# Patient Record
Sex: Male | Born: 1950 | Race: White | Hispanic: No | Marital: Married | Smoking: Current every day smoker
Health system: Southern US, Community
[De-identification: ages and names within clinical notes are randomized; demographics above are authoritative.]

## PROBLEM LIST (undated history)

## (undated) DIAGNOSIS — R011 Cardiac murmur, unspecified: Secondary | ICD-10-CM

## (undated) DIAGNOSIS — I5022 Chronic systolic (congestive) heart failure: Secondary | ICD-10-CM

## (undated) DIAGNOSIS — R112 Nausea with vomiting, unspecified: Secondary | ICD-10-CM

## (undated) DIAGNOSIS — E162 Hypoglycemia, unspecified: Secondary | ICD-10-CM

## (undated) DIAGNOSIS — G4733 Obstructive sleep apnea (adult) (pediatric): Secondary | ICD-10-CM

## (undated) DIAGNOSIS — D693 Immune thrombocytopenic purpura: Secondary | ICD-10-CM

## (undated) DIAGNOSIS — Z9989 Dependence on other enabling machines and devices: Secondary | ICD-10-CM

## (undated) DIAGNOSIS — M1712 Unilateral primary osteoarthritis, left knee: Secondary | ICD-10-CM

## (undated) DIAGNOSIS — Z9889 Other specified postprocedural states: Secondary | ICD-10-CM

## (undated) DIAGNOSIS — I639 Cerebral infarction, unspecified: Secondary | ICD-10-CM

## (undated) DIAGNOSIS — I77810 Thoracic aortic ectasia: Secondary | ICD-10-CM

## (undated) DIAGNOSIS — I219 Acute myocardial infarction, unspecified: Secondary | ICD-10-CM

## (undated) DIAGNOSIS — M1711 Unilateral primary osteoarthritis, right knee: Secondary | ICD-10-CM

## (undated) DIAGNOSIS — IMO0001 Reserved for inherently not codable concepts without codable children: Secondary | ICD-10-CM

## (undated) DIAGNOSIS — H547 Unspecified visual loss: Secondary | ICD-10-CM

## (undated) DIAGNOSIS — J310 Chronic rhinitis: Secondary | ICD-10-CM

## (undated) DIAGNOSIS — J189 Pneumonia, unspecified organism: Secondary | ICD-10-CM

## (undated) DIAGNOSIS — N400 Enlarged prostate without lower urinary tract symptoms: Secondary | ICD-10-CM

## (undated) DIAGNOSIS — E785 Hyperlipidemia, unspecified: Secondary | ICD-10-CM

## (undated) DIAGNOSIS — E119 Type 2 diabetes mellitus without complications: Secondary | ICD-10-CM

## (undated) DIAGNOSIS — Z8739 Personal history of other diseases of the musculoskeletal system and connective tissue: Secondary | ICD-10-CM

## (undated) DIAGNOSIS — J329 Chronic sinusitis, unspecified: Secondary | ICD-10-CM

## (undated) DIAGNOSIS — I10 Essential (primary) hypertension: Secondary | ICD-10-CM

## (undated) DIAGNOSIS — I2510 Atherosclerotic heart disease of native coronary artery without angina pectoris: Secondary | ICD-10-CM

## (undated) DIAGNOSIS — Z87442 Personal history of urinary calculi: Secondary | ICD-10-CM

## (undated) DIAGNOSIS — M1990 Unspecified osteoarthritis, unspecified site: Secondary | ICD-10-CM

## (undated) DIAGNOSIS — K635 Polyp of colon: Secondary | ICD-10-CM

## (undated) HISTORY — PX: KNEE CARTILAGE SURGERY: SHX688

## (undated) HISTORY — DX: Acute myocardial infarction, unspecified: I21.9

## (undated) HISTORY — DX: Morbid (severe) obesity due to excess calories: E66.01

## (undated) HISTORY — DX: Chronic rhinitis: J31.0

## (undated) HISTORY — DX: Obstructive sleep apnea (adult) (pediatric): G47.33

## (undated) HISTORY — DX: Polyp of colon: K63.5

## (undated) HISTORY — DX: Benign prostatic hyperplasia without lower urinary tract symptoms: N40.0

## (undated) HISTORY — DX: Cerebral infarction, unspecified: I63.9

## (undated) HISTORY — DX: Type 2 diabetes mellitus without complications: E11.9

## (undated) HISTORY — PX: SINUS IRRIGATION: SHX2411

## (undated) HISTORY — DX: Essential (primary) hypertension: I10

## (undated) HISTORY — PX: UMBILICAL HERNIA REPAIR: SHX196

## (undated) HISTORY — PX: JOINT REPLACEMENT: SHX530

## (undated) HISTORY — DX: Thoracic aortic ectasia: I77.810

## (undated) HISTORY — DX: Chronic sinusitis, unspecified: J32.9

## (undated) HISTORY — PX: CYST EXCISION: SHX5701

## (undated) HISTORY — PX: NASAL SINUS SURGERY: SHX719

## (undated) HISTORY — PX: MENISCUS REPAIR: SHX5179

## (undated) HISTORY — DX: Chronic systolic (congestive) heart failure: I50.22

## (undated) HISTORY — DX: Dependence on other enabling machines and devices: Z99.89

## (undated) HISTORY — PX: HERNIA REPAIR: SHX51

## (undated) HISTORY — DX: Atherosclerotic heart disease of native coronary artery without angina pectoris: I25.10

---

## 2003-11-27 HISTORY — PX: CARDIAC CATHETERIZATION: SHX172

## 2007-08-07 ENCOUNTER — Encounter: Admission: RE | Admit: 2007-08-07 | Discharge: 2007-08-07 | Payer: Self-pay | Admitting: Family Medicine

## 2010-01-23 ENCOUNTER — Encounter: Admission: RE | Admit: 2010-01-23 | Discharge: 2010-01-23 | Payer: Self-pay | Admitting: Family Medicine

## 2010-01-30 ENCOUNTER — Ambulatory Visit (HOSPITAL_COMMUNITY): Admission: RE | Admit: 2010-01-30 | Discharge: 2010-01-30 | Payer: Self-pay | Admitting: Family Medicine

## 2010-06-30 ENCOUNTER — Ambulatory Visit: Payer: Self-pay | Admitting: Internal Medicine

## 2010-06-30 DIAGNOSIS — G4733 Obstructive sleep apnea (adult) (pediatric): Secondary | ICD-10-CM

## 2010-07-02 DIAGNOSIS — I6789 Other cerebrovascular disease: Secondary | ICD-10-CM

## 2010-07-02 DIAGNOSIS — I10 Essential (primary) hypertension: Secondary | ICD-10-CM

## 2010-07-02 DIAGNOSIS — I252 Old myocardial infarction: Secondary | ICD-10-CM

## 2010-07-02 DIAGNOSIS — E119 Type 2 diabetes mellitus without complications: Secondary | ICD-10-CM | POA: Insufficient documentation

## 2010-07-25 ENCOUNTER — Encounter: Payer: Self-pay | Admitting: Internal Medicine

## 2010-07-25 ENCOUNTER — Ambulatory Visit (HOSPITAL_BASED_OUTPATIENT_CLINIC_OR_DEPARTMENT_OTHER): Admission: RE | Admit: 2010-07-25 | Discharge: 2010-07-25 | Payer: Self-pay | Admitting: Internal Medicine

## 2010-07-30 ENCOUNTER — Ambulatory Visit: Payer: Self-pay | Admitting: Internal Medicine

## 2010-08-08 ENCOUNTER — Ambulatory Visit: Payer: Self-pay | Admitting: Internal Medicine

## 2010-08-08 DIAGNOSIS — J328 Other chronic sinusitis: Secondary | ICD-10-CM | POA: Insufficient documentation

## 2010-08-15 ENCOUNTER — Telehealth (INDEPENDENT_AMBULATORY_CARE_PROVIDER_SITE_OTHER): Payer: Self-pay | Admitting: *Deleted

## 2010-09-01 ENCOUNTER — Encounter: Payer: Self-pay | Admitting: Internal Medicine

## 2010-09-13 ENCOUNTER — Ambulatory Visit: Payer: Self-pay | Admitting: Internal Medicine

## 2010-09-14 ENCOUNTER — Encounter: Payer: Self-pay | Admitting: Internal Medicine

## 2010-09-20 ENCOUNTER — Telehealth (INDEPENDENT_AMBULATORY_CARE_PROVIDER_SITE_OTHER): Payer: Self-pay | Admitting: *Deleted

## 2010-11-07 ENCOUNTER — Encounter: Payer: Self-pay | Admitting: Internal Medicine

## 2010-11-10 ENCOUNTER — Encounter: Payer: Self-pay | Admitting: Internal Medicine

## 2010-11-16 ENCOUNTER — Telehealth: Payer: Self-pay | Admitting: Internal Medicine

## 2010-12-14 ENCOUNTER — Ambulatory Visit
Admission: RE | Admit: 2010-12-14 | Discharge: 2010-12-14 | Payer: Self-pay | Source: Home / Self Care | Attending: Internal Medicine | Admitting: Internal Medicine

## 2010-12-26 NOTE — Assessment & Plan Note (Signed)
Summary: sleep apnea/apc   Primary Gordon Madden/Referring Jaina Morin:  Maurice Small  CC:  Sleep Consult-Dr. Valentina Lucks; no sleep study done.Falling asleep in afternoon.Gordon Madden  History of Present Illness: June 30, 2010- 60 yoM referred by Dr Maurice Small for suspected sleep apnea. He is aware of restless toss and turning, aggravated by nocturia related to his prostatism. Wife says he snores some, but not dramatically. He fights daytime sleepiness in the afternoons. Caffeine is some help in the morning. His eye doctor raised the issue by telling him his eyelids were elastic as a sign of sleep apnea.  Bedtime 11PM, latency 15-30 minutes, wakens to alertnes 2-3 times per night before waking at 6:10 AM. Rarely wakes gasping. Some exertional dyspnea but otherwise lungs are clear. No ENT surgery or thyroid hx. Silent MI , HBP and Stroke/ retinal artery branch occlusion hx. Occasional sinusitis.   Preventive Screening-Counseling & Management  Alcohol-Tobacco     Smoking Status: quit     Packs/Day: 3.0     Year Started: 1967     Year Quit: 1991     Cigars/week: 1nightly-currently x 5 years  Current Medications (verified): 1)  Avodart 0.5 Mg Caps (Dutasteride) .... Once Daily 2)  Carvedilol 6.25 Mg Tabs (Carvedilol) .... Take 1 By Mouth Two Times A Day 3)  Flomax 0.4 Mg Caps (Tamsulosin Hcl) .... Take 1 By Mouth Once Daily 4)  Flonase 50 Mcg/act Susp (Fluticasone Propionate) .Gordon Madden.. 1 Spray in Each Nostril Once Daily 5)  Lisinopril 10 Mg Tabs (Lisinopril) .... Take 1 By Mouth Once Daily 6)  Glucosamine-Chondroitin  Caps (Glucosamine-Chondroit-Vit C-Mn) .... Take 1 By Mouth Two Times A Day 7)  Plavix 75 Mg Tabs (Clopidogrel Bisulfate) .... Take 1 By Mouth Once Daily 8)  Pravastatin Sodium 40 Mg Tabs (Pravastatin Sodium) .... Take 1 By Mouth Once Daily 9)  Aspirin 81 Mg Tbec (Aspirin) .... Take 1 By Mouth Once Daily 10)  Folic Acid 400 Mcg Tabs (Folic Acid) .... Take 1 By Mouth Once Daily 11)  Fish Oil 1200  Mg Caps (Omega-3 Fatty Acids) .... Take 2 By Mouth Once Daily 12)  Complete Prostate Health  Tabs (Misc Natural Products) .... Take 1 By Mouth Once Daily 13)  Multivitamins  Tabs (Multiple Vitamin) .... 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 Cig's  smokes 1 cigar nightly ETOH-1-2 drinks a week  Risk Factors: Smoking Status: quit (06/30/2010) Packs/Day: 3.0 (06/30/2010) Cigars/wk: 1nightly-currently x 5 years (06/30/2010)  Past Medical History: BPH C V A / Stroke- retinal artery branch occlusion Hypertension Myocardial Infarction Cardiomyopathy, EF 40% rhinosinusitis Diabetes, Type 2 Colon polyps Chronic thrombocytopenia  Past Surgical History: Left knee meniscus repair Umbilical hernia  Family History: No Family Hx of OSA  Social History: Married with Journalist, newspaper Quit smoking Cig's  smokes 1 cigar nightly ETOH-1-2 drinks a week Smoking Status:  quit Packs/Day:  3.0 Cigars/week:  1nightly-currently x 5 years  Review of Systems      See HPI       The patient complains of shortness of breath with activity and joint stiffness or pain.  The patient denies shortness of breath at rest, productive cough, non-productive cough, coughing up blood, chest pain, irregular heartbeats, acid heartburn, indigestion, loss of appetite, weight change, abdominal pain, difficulty swallowing, sore throat, tooth/dental problems, headaches,  nasal congestion/difficulty breathing through nose, sneezing, itching, ear ache, anxiety, depression, hand/feet swelling, rash, change in color of mucus, and fever.    Vital Signs:  Patient profile:   60 year old male Weight:      339.38 pounds O2 Sat:      97 % on Room air Pulse rate:   62 / minute BP sitting:   120 /  80  (left arm) Cuff size:   large  Vitals Entered By: Reynaldo Minium CMA (June 30, 2010 10:18 AM)  O2 Flow:  Room air CC: Sleep Consult-Dr. Valentina Lucks; no sleep study done.Falling asleep in afternoon.   Physical Exam  Additional Exam:  General: A/Ox3; pleasant and cooperative, NAD, pleasantly conversational, overweight SKIN: no rash, lesions NODES: no lymphadenopathy HEENT: [place]/AT, EOM- WNL, Conjuctivae- clear, PERRLA, TM-WNL, Nose- clear, Throat- clear and wnl, Mallampati  III NECK: Supple w/ fair ROM, JVD- none, normal carotid impulses w/o bruits Thyroid- normal to palpation, no stridor CHEST: Clear to P&A HEART: RRR, no m/g/r heard ABDOMEN: Soft and nl; very obese SEG:BTDV, nl pulses, no edema  NEURO: Grossly intact to observation      Impression & Recommendations:  Problem # 1:  HYPERSOMNIA, ASSOCIATED WITH SLEEP APNEA (ICD-780.53)  Probable obstructive sleep apnea. We discussed the physiology and medical concerns. We discussed the process of a sleep study. With his agreement we are proceeding. We did point out the significant hypertensive and cardiovascular risk factors he brings, which may interact with OSA, we discussed driving responsibility and weight loss.  Medications Added to Medication List This Visit: 1)  Avodart 0.5 Mg Caps (Dutasteride) .... Once daily 2)  Carvedilol 6.25 Mg Tabs (Carvedilol) .... Take 1 by mouth two times a day 3)  Flomax 0.4 Mg Caps (Tamsulosin hcl) .... Take 1 by mouth once daily 4)  Flonase 50 Mcg/act Susp (Fluticasone propionate) .Gordon Madden.. 1 spray in each nostril once daily 5)  Lisinopril 10 Mg Tabs (Lisinopril) .... Take 1 by mouth once daily 6)  Glucosamine-chondroitin Caps (Glucosamine-chondroit-vit c-mn) .... Take 1 by mouth two times a day 7)  Plavix 75 Mg Tabs (Clopidogrel bisulfate) .... Take 1 by mouth once daily 8)  Pravastatin Sodium 40 Mg Tabs (Pravastatin sodium) .... Take 1 by mouth once daily 9)  Aspirin 81 Mg Tbec (Aspirin) ....  Take 1 by mouth once daily 10)  Folic Acid 400 Mcg Tabs (Folic acid) .... Take 1 by mouth once daily 11)  Fish Oil 1200 Mg Caps (Omega-3 fatty acids) .... Take 2 by mouth once daily 12)  Complete Prostate Health Tabs (Misc natural products) .... Take 1 by mouth once daily 13)  Multivitamins Tabs (Multiple vitamin) .... Take 1 by mouth once daily  Other Orders: Consultation Level IV (76160) Sleep Disorder Referral (Sleep Disorder)  Patient Instructions: 1)  Please schedule a follow-up appointment in 1 month. 2)  See [facility] to set up your sleep study.

## 2010-12-26 NOTE — Progress Notes (Signed)
Summary: Nuvigil works well- has some questions-  Phone Note Call from Patient Call back at [phone]   Caller: Patient Call For: Gordon Madden Summary of Call: PT HAVE QUESTIONS ABOUT SLEEP MEDICATION . HE IS AT WORK AND DO NOT HAVE THE NAME. Initial call taken by: Rickard Patience,  September 20, 2010 10:58 AM  Follow-up for Phone Call        [id number] to get more info. Carron Curie CMA  September 20, 2010 12:12 PM   RETURNING CALL.Marland KitchenChantel Bowne  September 20, 2010 12:57 PM  Additional Follow-up for Phone Call Additional follow up Details #1::        Called and spoke with pt.  He states "nuvigil is a miracle drug"- he has taken this med for the past 2 days in the am and states that he is not tired until bedtime.  Pt is questioning the cause of the sleepiness that he has in the afternoons when he does not take nuvigil.  He wonders that if his "body was trying to tell him something"- and states that if he really likes taking nuvigil but does not want to deprive his self of sleep if he really needs it.  Basically, he wants to know why he was so tired before.  Pls advise, thanks!! Additional Follow-up by: Vernie Murders,  September 20, 2010 1:56 PM    Additional Follow-up for Phone Call Additional follow up Details #2::    I have written for [facility] to increase his CPAP to 10 for trial. He can supplement with Nuvigil if needed, especially at work. I would rather have him take naps when he can.  We will see how he does over time.  Follow-up by: Waymon Budge MD,  September 20, 2010 4:04 PM  Additional Follow-up for Phone Call Additional follow up Details #3:: Details for Additional Follow-up Action Taken: [facility] Vernie Murders  September 20, 2010 4:17 PM  Spoke with pt and notified of the above recs per CDY.  Pt verbalized understanding. Additional Follow-up by: Vernie Murders,  September 20, 2010 4:43 PM

## 2010-12-26 NOTE — Assessment & Plan Note (Signed)
Summary: 6 weeks/apc   Primary Provider/Referring Provider:  Maurice Small  CC:  f/u sleep study.  History of Present Illness: CC:  Sleep Consult-Dr. Valentina Lucks; no sleep study done. Falling asleep in afternoon.[name] History of Present Illness: June 30, 2010- 59 yoM referred by Dr Maurice Small for suspected sleep apnea. He is aware of restless toss and turning, aggravated by nocturia related to his prostatism. Wife says he snores some, but not dramatically. He fights daytime sleepiness in the afternoons. Caffeine is some help in the morning. His eye doctor raised the issue by telling him his eyelids were elastic as a sign of sleep apnea.  Bedtime 11PM, latency 15-30 minutes, wakens to alertnes 2-3 times per night before waking at 6:10 AM. Rarely wakes gasping. Some exertional dyspnea but otherwise lungs are clear. No ENT surgery or thyroid hx. Silent MI , HBP and Stroke/ retinal artery branch occlusion hx. Occasional sinusitis.   August 08, 2010- Hypersomnia, OSA., rhinitis...............Marland Kitchenwife here Returns after sleep study. NPSG- 14.9/hr. moderate obstructive sleep apnea. CPAP was titrted to 9. Discussed medical issues. He had some comfort issues related to nasal congestion and has had past hx of chronic sinutis. Still using Flonase..   Preventive Screening-Counseling & Management  Alcohol-Tobacco     Smoking Status: current     Smoking Cessation Counseling: yes     Smoke Cessation Stage: contemplative     Cigars/week: 7  Comments: Quit cigs but stil smoking 1 cigar/ day.  Current Medications (verified): 1)  Avodart 0.5 Mg Caps (Dutasteride) .... Once Daily 2)  Carvedilol 6.25 Mg Tabs (Carvedilol) .... Take 1 By Mouth Two Times A Day 3)  Flomax 0.4 Mg Caps (Tamsulosin Hcl) .... Take 1 By Mouth Once Daily 4)  Flonase 50 Mcg/act Susp (Fluticasone Propionate) .[name].. 1 Spray in Each Nostril Once Daily 5)  Lisinopril 10 Mg Tabs (Lisinopril) .... Take 1 By Mouth Once Daily 6)   Glucosamine-Chondroitin  Caps (Glucosamine-Chondroit-Vit C-Mn) .... Take 1 By Mouth Two Times A Day 7)  Plavix 75 Mg Tabs (Clopidogrel Bisulfate) .... Take 1 By Mouth Once Daily 8)  Pravastatin Sodium 40 Mg Tabs (Pravastatin Sodium) .... Take 1 By Mouth Once Daily 9)  Aspirin 81 Mg Tbec (Aspirin) .... Take 1 By Mouth Once Daily 10)  Folic Acid 400 Mcg Tabs (Folic Acid) .... Take 1 By Mouth Once Daily 11)  Fish Oil 1200 Mg Caps (Omega-3 Fatty Acids) .... Take 2 By Mouth Once Daily 12)  Complete Prostate Health  Tabs (Misc Natural Products) .... Take 1 By Mouth Once Daily 13)  Multivitamins  Tabs (Multiple Vitamin) .... 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  smokes 1 cigar nightly ETOH-1-2 drinks a week  Risk Factors: Smoking Status: current (08/08/2010) Packs/Day: 3.0 (06/30/2010) Cigars/wk: 7 (08/08/2010)  Past Medical History: BPH C V A / Stroke- retinal artery branch occlusion Hypertension Myocardial Infarction Cardiomyopathy, EF 40% rhinosinusitis Diabetes, Type 2 Colon polyps Chronic thrombocytopenia Obstructive Sleep Apnea 07/25/10 AHI 14.9/ hr  Social History: Smoking Status:  current Cigars/week:  7  Review of Systems      See HPI  The patient denies anorexia, fever, weight loss, weight gain, vision loss, decreased hearing, hoarseness, chest pain, syncope, peripheral edema, prolonged  cough, headaches, hemoptysis, abdominal pain, melena, severe indigestion/heartburn, incontinence, muscle weakness, suspicious skin lesions, unusual weight change, and enlarged lymph nodes.    Vital Signs:  Patient profile:   59 year old male Height:      72  inches Weight:      342 pounds BMI:     46.55 O2 Sat:      99 % on Room air Pulse rate:   72 / minute BP sitting:   110 / 68  (left arm) Cuff size:   large  Vitals Entered By: Elray Buba RN (August 08, 2010 8:51 AM)  O2 Flow:  Room air CC: f/u sleep study Is Patient Diabetic? No Comments Medications reviewed with patient  Elray Buba RN  August 08, 2010 8:55 AM    Physical Exam  Additional Exam:  General: A/Ox3; pleasant and cooperative, NAD, pleasantly conversational, overweight SKIN: no rash, lesions NODES: no lymphadenopathy HEENT: [place]/AT, EOM- WNL, Conjuctivae- clear, PERRLA, TM-WNL, Nose- clear, Throat- clear and wnl, Mallampati  III NECK: Supple w/ fair ROM, JVD- none, normal carotid impulses w/o bruits Thyroid- normal to palpation, no stridor CHEST: Clear to P&A HEART: RRR, no m/g/r heard ABDOMEN: Soft and nl; very obese LFY:BOFB, nl pulses, no edema  NEURO: Grossly intact to observation      Impression & Recommendations:  Problem # 1:  HYPERSOMNIA, ASSOCIATED WITH SLEEP APNEA (ICD-780.53)  Moderate obstructive sleep apnea. Plan-  Weight loss, oral appliances, surgery and CPAP were considered as treatment options. We will start with CPAP at 9.  Problem # 2:  RHINOSINUSITIS, CHRONIC (ICD-473.8) This has been a problem despite using flonase. i will give him a sample of Omnaris to try instead at this time.  Other Orders: Est. Patient Level IV (51025) DME Referral (DME)  Patient Instructions: 1)  Please schedule a follow-up appointment in 1 month. 2)  See [facility] to start CPAP 3)  Sample Omnaris nasal steroid spray: 4)   2 sprays each nostril once daily at bedtime. TRry this instead of flonase.

## 2010-12-26 NOTE — Letter (Signed)
Summary: CMN/[facility]  CMN/[facility]   Imported By: Lester Dix 09/05/2010 09:26:58  _____________________________________________________________________  External Attachment:    Type:   Image     Comment:   External Document

## 2010-12-26 NOTE — Assessment & Plan Note (Signed)
Summary: 5 WEEKS/ MBW   Primary Provider/Referring Provider:  Maurice Small  CC:  5 week follow up visit-sleep; doing good with CPAP; no compliance report as of today.Gordon Madden  History of Present Illness: CC:  Sleep Consult-Dr. Valentina Lucks; no sleep study done. Falling asleep in afternoon.Gordon Madden History of Present Illness: June 30, 2010- 60 yoM referred by Dr Maurice Small for suspected sleep apnea. He is aware of restless toss and turning, aggravated by nocturia related to his prostatism. Wife says he snores some, but not dramatically. He fights daytime sleepiness in the afternoons. Caffeine is some help in the morning. His eye doctor raised the issue by telling him his eyelids were elastic as a sign of sleep apnea.  Bedtime 11PM, latency 15-30 minutes, wakens to alertnes 2-3 times per night before waking at 6:10 AM. Rarely wakes gasping. Some exertional dyspnea but otherwise lungs are clear. No ENT surgery or thyroid hx. Silent MI , HBP and Stroke/ retinal artery branch occlusion hx. Occasional sinusitis.   August 08, 2010- Hypersomnia, OSA., rhinitis...............Gordon Kitchenwife here Returns after sleep study. NPSG- 14.9/hr. moderate obstructive sleep apnea. CPAP was titrated to 9. Discussed medical issues. He had some comfort issues related to nasal congestion and has had past hx of chronic sinutis. Still using Flonase..  September 13, 2010- Hypersomnia, OSA., rhinitis. Nurse cc: Hypersomnia, OSA., rhinitis. Love hate relationship with cpap but he continues to wear it every night and says he is sleeping deeper. Mask sounds as if it may be too tight on his nose, hose pulls. He also notes he still fights daytime sleepiness. Hx of retinal artery occlusion on plavix. Already drinks a lot of coffee. Denies catplexy or sleep paralyisis. Bedtime 1050PM, woke in AM 515AM, the dozed off and on till getting up a little after 6AM. Denies chest pains, palpitation, syncope. Has not turned in a download card yet. CPAP is at  9. He has not lost weight.  Preventive Screening-Counseling & Management  Alcohol-Tobacco     Smoking Status: current     Smoking Cessation Counseling: yes     Smoke Cessation Stage: contemplative     Packs/Day: 3.0     Year Started: 1967     Year Quit: 1991     Cigars/week: 7  Current Medications (verified): 1)  Avodart 0.5 Mg Caps (Dutasteride) .... Once Daily 2)  Carvedilol 6.25 Mg Tabs (Carvedilol) .... Take 1 By Mouth Two Times A Day 3)  Flomax 0.4 Mg Caps (Tamsulosin Hcl) .... Take 1 By Mouth Once Daily 4)  Lisinopril 10 Mg Tabs (Lisinopril) .... Take 1 By Mouth Once Daily 5)  Glucosamine-Chondroitin  Caps (Glucosamine-Chondroit-Vit C-Mn) .... Take 1 By Mouth Two Times A Day 6)  Plavix 75 Mg Tabs (Clopidogrel Bisulfate) .... Take 1 By Mouth Once Daily 7)  Pravastatin Sodium 40 Mg Tabs (Pravastatin Sodium) .... Take 1 By Mouth Once Daily 8)  Aspirin 81 Mg Tbec (Aspirin) .... Take 1 By Mouth Once Daily 9)  Folic Acid 400 Mcg Tabs (Folic Acid) .... Take 1 By Mouth Once Daily 10)  Fish Oil 1200 Mg Caps (Omega-3 Fatty Acids) .... Take 2 By Mouth Once Daily 11)  Complete Prostate Health  Tabs (Misc Natural Products) .... Take 1 By Mouth Once Daily 12)  Multivitamins  Tabs (Multiple Vitamin) .... Take 1 By Mouth Once Daily 13)  Omnaris 50 Mcg/act Susp (Ciclesonide) .... 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    Vital Signs:  Patient profile:   60 year old male Height:      72 inches Weight:      346.50 pounds BMI:     47.16 O2 Sat:      95 % on Room air Pulse rate:   58 / minute BP sitting:   130 / 70  (left arm) Cuff size:   large  Vitals Entered By: Reynaldo Minium CMA (September 13, 2010 9:56 AM)  O2 Flow:  Room air CC: 5 week follow up visit-sleep; doing good with CPAP; no compliance report as of today.   Physical Exam  Additional Exam:  General: A/Ox3; pleasant and cooperative, NAD, pleasantly conversational, overweight SKIN: no rash, lesions NODES: no lymphadenopathy HEENT: [place]/AT, EOM- WNL, Conjuctivae- clear, PERRLA, TM-WNL, Nose- clear, Throat- clear and wnl, Mallampati  III NECK: Supple w/ fair ROM, JVD- none, normal carotid impulses w/o bruits Thyroid- normal to palpation, no stridor CHEST: Clear to P&A HEART: RRR, no m/g/r heard ABDOMEN: Soft and nl; very obese MWU:XLKG, nl pulses, no edema  NEURO: Grossly intact to observation      Impression & Recommendations:  Problem # 1:  HYPERSOMNIA, ASSOCIATED WITH SLEEP APNEA (ICD-780.53)  Good compliance and control with CPAP. He needs to work with his DME company on mask comfort, and he needs to lose weight, as discussed.  We will leave pressure at 9 for now. He will try samples of Nuvigil and try getting an hour more sleep at night if possible.  Question if carvedilol is contributing to his sense of tiredness.   Orders: Est. Patient  Level IV (40102)  Problem # 2:  RHINOSINUSITIS, CHRONIC (ICD-473.8) We discussed maintneance of a comfortable nasal airway as supportive of successful CPAP use.   Problem # 3:  Hx of MYOCARDIAL INFARCTION (ICD-410.90) We reviewed medical benefits of Sleep apnea control especially with his cardiovascular hx, in an effort to keep him motivated and compliant with CPAP. His updated medication list for this problem includes:    Carvedilol 6.25 Mg Tabs (Carvedilol) .Gordon Madden... Take 1 by mouth two times a day    Lisinopril 10 Mg Tabs (Lisinopril) .Gordon Madden... Take 1 by mouth once daily    Plavix 75 Mg Tabs (Clopidogrel bisulfate) .Gordon Madden... Take 1 by mouth once daily    Aspirin 81 Mg Tbec (Aspirin) .Gordon Madden... Take 1 by mouth once daily  Medications Added to Medication List This Visit: 1)  Cpap 9 [facility]  2)  Nuvigil 150 Mg Tabs (Armodafinil) .Gordon Madden.. 1 daily as needed  Patient Instructions: 1)  Please schedule a follow-up appointment in 3 months. 2)  Consider with your cardiologist whether carvedilol may contribute to your sense of  tiredness. 3)  Try samples Nuvigil 150 mg, 1 daily if needed. Script. 4)  Talk to your home care company about mask comfort and ideas like a longer hose etc.  Prescriptions: NUVIGIL 150 MG TABS (ARMODAFINIL) 1 daily as needed  #30 x 5   Entered and Authorized by:   Waymon Budge MD   Signed by:   Waymon Budge MD on 09/13/2010   Method used:   Print then Give to Patient   RxID:   [id number]

## 2010-12-26 NOTE — Progress Notes (Signed)
Summary: refill/cb - LMTCB x 1  Phone Note Call from Patient Call back at [phone]   Caller: Patient Call For: young Summary of Call: wants to get rx for omnairus filled [facility] [address] [phone] had samples Initial call taken by: Lacinda Axon,  August 15, 2010 11:56 AM  Follow-up for Phone Call        At last OV on 9.13.11 with CY, pt given sample of Omnaris to trial inplace of Flonase.  [facility] Crystal Jones RN  August 15, 2010 12:17 PM   Additional Follow-up for Phone Call Additional follow up Details #1::        Patient returning call.  Please call back at same number. Additional Follow-up by: Lehman Prom,  August 15, 2010 1:17 PM    Additional Follow-up for Phone Call Additional follow up Details #2::    Spoke with pt.  He states that the omnaris works well, much better than the flonase, so rx was sent to [facility]. Follow-up by: Vernie Murders,  August 15, 2010 1:43 PM  New/Updated Medications: OMNARIS 50 MCG/ACT SUSP (CICLESONIDE) 2 sprays each nostril at bedtime Prescriptions: OMNARIS 50 MCG/ACT SUSP (CICLESONIDE) 2 sprays each nostril at bedtime  #1 x 11   Entered by:   Vernie Murders   Authorized by:   Waymon Budge MD   Signed by:   Vernie Murders on 08/15/2010   Method used:   Electronically to        [facility] (retail)       [address]       [place], [place]  [address]       Ph: [phone]       Fax: [phone]   RxID:   [id number]

## 2010-12-28 NOTE — Progress Notes (Signed)
Summary: CPAP titrated to 10. Good compliance.  Phone Note Other Incoming   Summary of Call: CPAP auto download good compliance and control at 10. Will increase from 9.    New/Updated Medications: * CPAP 10 [facility]

## 2010-12-28 NOTE — Letter (Signed)
Summary: [facility]   Imported By: Lester [place]la 11/23/2010 08:30:09  _____________________________________________________________________  External Attachment:    Type:   Image     Comment:   External Document

## 2010-12-28 NOTE — Assessment & Plan Note (Signed)
Summary: 3 months/ mbw   Primary Provider/Referring Provider:  Maurice Small  CC:  3 month follow up visit-sleep-using CPAP each night(recently got new face mask); ? head cold-runny nose today.[name]  History of Present Illness: August 08, 2010- Hypersomnia, OSA., rhinitis...............Marland Kitchenwife here Returns after sleep study. NPSG- 14.9/hr. moderate obstructive sleep apnea. CPAP was titrated to 9. Discussed medical issues. He had some comfort issues related to nasal congestion and has had past hx of chronic sinutis. Still using Flonase..  September 13, 2010- Hypersomnia, OSA., rhinitis. Nurse cc: Hypersomnia, OSA., rhinitis. Love hate relationship with cpap but he continues to wear it every night and says he is sleeping deeper. Mask sounds as if it may be too tight on his nose, hose pulls. He also notes he still fights daytime sleepiness. Hx of retinal artery occlusion on plavix. Already drinks a lot of coffee. Denies catplexy or sleep paralyisis. Bedtime 1050PM, woke in AM 515AM, the dozed off and on till getting up a little after 6AM. Denies chest pains, palpitation, syncope. Has not turned in a download card yet. CPAP is at 9. He has not lost weight.  December 14, 2010-  Hypersomnia, OSA., rhinitis. Nurse-CC: 3 month follow up visit-sleep-using CPAP each night(recently got new face mask); ? head cold-runny nose today. CPAP 10, compliance has been good.  Woke this morning with incidental cold and had to take mask off early. Otherwise he uses it regularly. Discussed availability of longer hose.  Tried Nuvigil only once or twice- he was afraid it almost worked too well. He would use it if needed.    Preventive Screening-Counseling & Management  Alcohol-Tobacco     Smoking Status: current     Smoking Cessation Counseling: yes     Smoke Cessation Stage: contemplative     Packs/Day: 3.0     Year Started: 1967     Year Quit: 1991     Cigars/week: 7  Current Medications (verified): 1)   Avodart 0.5 Mg Caps (Dutasteride) .... Once Daily 2)  Carvedilol 6.25 Mg Tabs (Carvedilol) .... Take 1 By Mouth Once Daily 3)  Flomax 0.4 Mg Caps (Tamsulosin Hcl) .... Take 1 By Mouth Once Daily 4)  Lisinopril 10 Mg Tabs (Lisinopril) .... Take 1 By Mouth Once Daily 5)  Glucosamine-Chondroitin  Caps (Glucosamine-Chondroit-Vit C-Mn) .... Take 1 By Mouth Two Times A Day 6)  Plavix 75 Mg Tabs (Clopidogrel Bisulfate) .... Take 1 By Mouth Once Daily 7)  Aspirin 81 Mg Tbec (Aspirin) .... Take 1 By Mouth Once Daily 8)  Folic Acid 400 Mcg Tabs (Folic Acid) .... Take 1 By Mouth Once Daily 9)  Fish Oil 1200 Mg Caps (Omega-3 Fatty Acids) .... Take 2 By Mouth Once Daily 10)  Multivitamins  Tabs (Multiple Vitamin) .... Take 1 By Mouth Once Daily 11)  Omnaris 50 Mcg/act Susp (Ciclesonide) .... 2 Sprays Each Nostril At Bedtime 12)  Cpap 10 Advanced 13)  Nuvigil 150 Mg Tabs (Armodafinil) .[name].. 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    Vital Signs:  Patient profile:   60 year old male Height:      72 inches Weight:      341.25 pounds BMI:     46.45 O2 Sat:      95 % on Room air Pulse rate:   95 / minute BP sitting:   132 / 84  (right arm) Cuff size:   large  Vitals Entered By: Reynaldo Minium CMA (December 14, 2010 9:21 AM)  O2 Flow:  Room air CC: 3 month follow up visit-sleep-using CPAP each night(recently got new face mask); ? head cold-runny nose today.   Physical Exam  Additional Exam:  General: A/Ox3; pleasant and cooperative, NAD, pleasantly conversational, overweight SKIN: no rash, lesions NODES: no lymphadenopathy HEENT: [place]/AT, EOM- WNL, Conjuctivae- clear, PERRLA, TM-WNL, Nose- clear, Throat- clear and wnl, Mallampati  III. Red throat  NECK: Supple w/ fair ROM, JVD- none, normal carotid impulses w/o bruits Thyroid- normal to palpation, no stridor CHEST: Clear to P&A, dry cough HEART: RRR, no m/g/r heard ABDOMEN: Soft and nl; very obese MWN:UUVO, nl pulses, no edema  NEURO: Grossly intact to observation      Impression & Recommendations:  Problem # 1:  HYPERSOMNIA, ASSOCIATED WITH SLEEP APNEA (ICD-780.53)  Good compliance and control. Weight loss would help.   Orders: Est. Patient Level III (53664)  Problem # 2:  RHINOSINUSITIS, CHRONIC (ICD-473.8) hx of sinus problems but what he has today is a mild viral syndrome that should resolve. He notes dry cough and we discussed his lisinopril and beta blocker. Cough now is mainly due to his cold.   Medications Added to Medication List This Visit: 1)  Carvedilol 6.25 Mg Tabs (Carvedilol) .... Take 1 by mouth once daily  Patient Instructions: 1)  Please schedule a follow-up appointment in 6 months. 2)  Continue CPAP at 10

## 2011-02-19 LAB — CBC
HCT: 47 % (ref 39.0–52.0)
MCHC: 33.8 g/dL (ref 30.0–36.0)
MCV: 94.7 fL (ref 78.0–100.0)
RDW: 13 % (ref 11.5–15.5)
WBC: 6.7 10*3/uL (ref 4.0–10.5)

## 2011-02-19 LAB — BASIC METABOLIC PANEL
BUN: 19 mg/dL (ref 6–23)
CO2: 27 mEq/L (ref 19–32)
Chloride: 105 mEq/L (ref 96–112)
GFR calc Af Amer: >60 mL/min (ref >60)
Glucose, Bld: 164 mg/dL — ABNORMAL HIGH (ref 70–99)
Potassium: 4.4 mEq/L (ref 3.5–5.1)

## 2011-02-19 LAB — PROTIME-INR
INR: 1.04 (ref 0.00–1.49)
Prothrombin Time: 13.5 seconds (ref 11.6–15.2)

## 2011-06-01 ENCOUNTER — Telehealth: Payer: Self-pay | Admitting: Internal Medicine

## 2011-06-01 NOTE — Telephone Encounter (Signed)
Change to Veramyst, # 1    2 puffs each nostril once daily   Refill prn

## 2011-06-01 NOTE — Telephone Encounter (Signed)
[facility]erson Newcomer is not covered by [employer].  The covered alternatives are Nasonex or Veramyst. Pls advise.  NKDA.

## 2011-06-04 NOTE — Telephone Encounter (Signed)
Pt states he still has several samples left of omnaris and wants to finish these out first before an rx is sent for veramyst. I advised I have added med to his med list and to call when he wants refill. Carron Curie, CMA

## 2011-06-19 ENCOUNTER — Ambulatory Visit: Payer: Self-pay | Admitting: Internal Medicine

## 2011-07-31 ENCOUNTER — Encounter: Payer: Self-pay | Admitting: Internal Medicine

## 2011-08-01 ENCOUNTER — Ambulatory Visit (INDEPENDENT_AMBULATORY_CARE_PROVIDER_SITE_OTHER): Payer: PRIVATE HEALTH INSURANCE | Admitting: Internal Medicine

## 2011-08-01 ENCOUNTER — Encounter: Payer: Self-pay | Admitting: Internal Medicine

## 2011-08-01 VITALS — BP 140/78 | HR 68 | Ht 72.0 in | Wt 339.0 lb

## 2011-08-01 DIAGNOSIS — G4730 Sleep apnea, unspecified: Secondary | ICD-10-CM

## 2011-08-01 DIAGNOSIS — J328 Other chronic sinusitis: Secondary | ICD-10-CM

## 2011-08-01 NOTE — Progress Notes (Signed)
Subjective:    Patient ID: Gordon Madden, male    DOB: October 25, 1951, 60 y.o.   MRN: [id number]  HPI 08/01/11 62 yoM smoker followed for hypersomnia with sleep apnea, rhinitis, complicated by DM, HBP, CAD/MI, CVA/stroke    Dr Maurice Small PCP Last here 12/14/2010 OSA -CPAP 10 Advanced-using all night every night. New visual has helped for breakthrough daytime sleepiness. Scheduled for sinus surgery a week from today with Dr Emeline Darling ENT. Sinus pain began two months ago. CT positive after several antibiotics. Dx'd sinusitis with septal deviation. He had concerns about use of CPAP after the surgery and we discussed - he will probably have to skip CPAP briefly. He felt comfortably leaving off CPAP for a few days while travelling, otherwise he has used it regularly.  Denies chest tightness, cough or increased shortness of breath. Newly dx'd DMII by his PCP. He is walking 40 minutes daily.  Still smoking 1 cigar daily- not inhaling. Quit his 3ppd cigarette habit 20 years ago.   Review of Systems Constitutional:   No-   weight loss, night sweats, fevers, chills, fatigue, lassitude. HEENT:   No-  headaches, difficulty swallowing, tooth/dental problems, sore throat,       +  sneezing, itching, ear ache, nasal congestion, post nasal drip,  CV:  No-   chest pain, orthopnea, PND, swelling in lower extremities, anasarca, dizziness, palpitations Resp: No- acute  shortness of breath with exertion or at rest.              No-   productive cough,  No non-productive cough,  No-  coughing up of blood.              No-   change in color of mucus.  No- wheezing.   Skin: No-   rash or lesions. GI:  No-   heartburn, indigestion, abdominal pain, nausea, vomiting, diarrhea,                 change in bowel habits, loss of appetite GU: No-   dysuria, change in color of urine, no urgency or frequency.  No- flank pain. MS:  No-   joint pain or swelling.  No- decreased range of motion.  No- back pain. Neuro- grossly normal to  observation, Or:  Psych:  No- change in mood or affect. No depression or anxiety.  No memory loss.      Objective:   Physical Exam General- Alert, Oriented, Affect-appropriate, Distress- none acute;  Quite obese Skin- rash-none, lesions- none, excoriation- none Lymphadenopathy- none Head- atraumatic            Eyes- Gross vision intact, PERRLA, conjunctivae clear secretions            Ears- Hearing, canals normal            Nose- Clear, Septal dev- not apparent from anterior., mucus, polyps, erosion, perforation             Throat- Mallampati II-III , mucosa clear , drainage- none, tonsils- atrophic Neck- flexible , trachea midline, no stridor , thyroid nl, carotid no bruit Chest - symmetrical excursion , unlabored           Heart/CV- RRR , no murmur , no gallop  , no rub, nl s1 s2                           - JVD- none , edema- none, stasis changes- none, varices- none  Lung- clear to P&A, wheeze- none, cough- none , dullness-none, rub- none           Chest wall-  Abd- tender-no, distended-no, bowel sounds-present, HSM- no Br/ Gen/ Rectal- Not done, not indicated Extrem- cyanosis- none, clubbing, none, atrophy- none, strength- nl Neuro- grossly intact to observation        Assessment & Plan:

## 2011-08-01 NOTE — Patient Instructions (Signed)
Talk with Dr Emeline Darling about use of CPAP after surgery, but resume regular use as soon as you can.   Talk with Dr Valentina Lucks about your anticoagulant use through surgery.   I do think you would be better off without the cigars.

## 2011-08-01 NOTE — Assessment & Plan Note (Addendum)
He will need to discuss with Dr Emeline Darling how to use CPAP after the surgery. We discussed risks of sedating pain meds, nasal obstruction and his sleep apnea. There may be opportunity to adjust the pressure once he has fully recovered.  Nuvigil sample was very helpful on a few occasions. He got a discount. He has script filled.  He has a way to go for meaningul weight loss. Still gets tired in mid afternoon. Suggested naps. He  will try taking an extra 1/2 tab of Nuvigil, or taking it later, as needed. We can get an MSLT after he recovers from his surgery as needed.

## 2011-08-04 NOTE — Assessment & Plan Note (Signed)
We had discussed management CPAP around the time of his surgery

## 2011-08-08 ENCOUNTER — Ambulatory Visit (HOSPITAL_COMMUNITY)
Admission: RE | Admit: 2011-08-08 | Discharge: 2011-08-08 | Disposition: A | Discharge status: Home / Self Care | Payer: PRIVATE HEALTH INSURANCE | Source: Ambulatory Visit | Attending: Otolaryngology | Admitting: Otolaryngology

## 2011-08-08 ENCOUNTER — Other Ambulatory Visit (HOSPITAL_COMMUNITY): Payer: Self-pay | Admitting: Otolaryngology

## 2011-08-08 ENCOUNTER — Encounter (HOSPITAL_COMMUNITY)
Admission: RE | Admit: 2011-08-08 | Discharge: 2011-08-08 | Disposition: A | Discharge status: Home / Self Care | Payer: PRIVATE HEALTH INSURANCE | Source: Ambulatory Visit | Attending: Otolaryngology | Admitting: Otolaryngology

## 2011-08-08 DIAGNOSIS — Z01811 Encounter for preprocedural respiratory examination: Secondary | ICD-10-CM

## 2011-08-08 LAB — CBC
HCT: 44.1 % (ref 39.0–52.0)
MCH: 32.2 pg (ref 26.0–34.0)
MCHC: 35.6 g/dL (ref 30.0–36.0)
Platelets: 91 10*3/uL — ABNORMAL LOW (ref 150–400)
RBC: 4.87 MIL/uL (ref 4.22–5.81)

## 2011-08-08 LAB — BASIC METABOLIC PANEL
BUN: 18 mg/dL (ref 6–23)
CO2: 30 mEq/L (ref 19–32)
Glucose, Bld: 139 mg/dL — ABNORMAL HIGH (ref 70–99)
Potassium: 5 mEq/L (ref 3.5–5.1)

## 2011-08-08 LAB — SURGICAL PCR SCREEN
MRSA, PCR: NEGATIVE
Staphylococcus aureus: NEGATIVE

## 2011-08-09 ENCOUNTER — Observation Stay (HOSPITAL_COMMUNITY)
Admission: RE | Admit: 2011-08-09 | Discharge: 2011-08-10 | Disposition: A | Discharge status: Home / Self Care | Payer: PRIVATE HEALTH INSURANCE | Source: Ambulatory Visit | Attending: Otolaryngology | Admitting: Otolaryngology

## 2011-08-09 DIAGNOSIS — I2510 Atherosclerotic heart disease of native coronary artery without angina pectoris: Secondary | ICD-10-CM | POA: Insufficient documentation

## 2011-08-09 DIAGNOSIS — G4733 Obstructive sleep apnea (adult) (pediatric): Secondary | ICD-10-CM | POA: Insufficient documentation

## 2011-08-09 DIAGNOSIS — Z7982 Long term (current) use of aspirin: Secondary | ICD-10-CM | POA: Insufficient documentation

## 2011-08-09 DIAGNOSIS — F17200 Nicotine dependence, unspecified, uncomplicated: Secondary | ICD-10-CM | POA: Insufficient documentation

## 2011-08-09 DIAGNOSIS — I10 Essential (primary) hypertension: Secondary | ICD-10-CM | POA: Insufficient documentation

## 2011-08-09 DIAGNOSIS — Z7902 Long term (current) use of antithrombotics/antiplatelets: Secondary | ICD-10-CM | POA: Insufficient documentation

## 2011-08-09 DIAGNOSIS — J343 Hypertrophy of nasal turbinates: Secondary | ICD-10-CM | POA: Insufficient documentation

## 2011-08-09 DIAGNOSIS — Z79899 Other long term (current) drug therapy: Secondary | ICD-10-CM | POA: Insufficient documentation

## 2011-08-09 DIAGNOSIS — J320 Chronic maxillary sinusitis: Principal | ICD-10-CM | POA: Insufficient documentation

## 2011-08-09 DIAGNOSIS — J342 Deviated nasal septum: Secondary | ICD-10-CM | POA: Insufficient documentation

## 2011-08-09 LAB — GLUCOSE, CAPILLARY: Glucose-Capillary: 128 mg/dL — ABNORMAL HIGH (ref 70–99)

## 2011-08-09 LAB — ABO/RH: ABO/RH(D): A POS

## 2011-08-10 ENCOUNTER — Other Ambulatory Visit: Payer: Self-pay | Admitting: Otolaryngology

## 2011-08-10 LAB — GLUCOSE, CAPILLARY: Glucose-Capillary: 141 mg/dL — ABNORMAL HIGH (ref 70–99)

## 2011-08-13 LAB — CROSSMATCH
ABO/RH(D): A POS
Antibody Screen: NEGATIVE
Unit division: 0

## 2011-08-13 NOTE — Op Note (Signed)
Gordon Madden, MACLIN NO.:  [id number]  MEDICAL RECORD NO.:  [id number]  LOCATION:  5148                         FACILITY:  [facility]  PHYSICIAN:  Melvenia Beam, MD      DATE OF BIRTH:  01-26-1951  DATE OF PROCEDURE:  08/09/2011 DATE OF DISCHARGE:                              OPERATIVE REPORT   PREOPERATIVE DIAGNOSES:  Severe right septal deviation, bilateral inferior turbinate hypertrophy, nasal obstruction, and chronic right maxillary sinusitis.  POSTOPERATIVE DIAGNOSES:  Severe right septal deviation, bilateral inferior turbinate hypertrophy, nasal obstruction, and chronic right maxillary sinusitis.  PROCEDURES PERFORMED:  30140 bilateral inferior turbinate reduction, 31256 -R right maxillary antrostomy without tissue removal, 16109 endoscopic septoplasty.  COMPLICATIONS:  None.  ANESTHESIA:  General endotracheal.  SURGEON:  Melvenia Beam, MD  SPECIMENS:  Sinus and nasal contents sent to Pathology.  DRESSINGS AND DRAINS:  Left-sided Doyle splint sewn to the septum and right-sided Merocel/Finger Cot sponge taped to the patient's face.  ESTIMATED BLOOD LOSS:  250 mL.  OPERATIVE DETAILS:  The patient was correctly identified in the preoperative holding area, brought to the operating room under anesthesia, placed supine on the operating table, intubated without incident.  Table was turned 90 degrees counterclockwise away from anesthesia.  Bilateral greater palatine blocks were placed in the standard fashion, 1% lidocaine with epinephrine.  The nose was decongested with topical Afrin pledgets bilaterally and the nose was then inspected with the 0-degree endoscope.  The patient was noted to have a severe completely obstructive right septal deviation with a huge right septal spur causing complete blockage of the right nasopharynx. On the left side, he was noted to have severe left-sided inferior turbinate hypertrophy with moderate inferior turbinate  hypertrophy on the right.  First I began by placing submucosal injection of 1% lidocaine with epinephrine in the bilateral inferior turbinates and bilateral septum. I then made a vertical incision Killian-type incision with the 15 blade on the septum.  I then carefully using the caudal elevator and suction caudal, elevated a sub-mucoperichondrium, mucoperiosteal flap on the left side of the septum, which I carefully elevated all the way back to the sphenoid rostrum taking care not to make any perforations in the flap.  With the flap raised and completely intact on the left side of the septum, I turned to the right side.  The patient had a huge right- sided septal spur.  Given this I dissected the septal mucosa directly off the spur and carefully elevated the right side of sub- mucoperichondrium to submucoperiosteal flap all the way back to the sphenoid rostrum.  Once the mucosa was completely dissected off the bony spur and off the septal cartilage and bone, I made a vertical incision in the anterior septum to the anterior septal cartilage about 1 cm posterior to my Killian incision and then this was performed prior to elevating the right-sided septal flap.  I then used the slotted Dwaine Gale as well as the straight Genevie Ann Blakesley forceps to carefully remove the deviated bone and cartilage of the septum including the large right-sided septal spur taking care not to make any perforations on the left side of the septum.  A  generous 2 cm superior and anterior septal cartilaginous strut was left in place.  Once all the deviated bone and cartilage was carefully removed, the septal flaps were carefully suctioned out and then reapproximated using a through-and-through 3-0 chromic whipstitch to buttress the septal flaps back together.  Once septoplasty was completed, I proceeded with right maxillary antrostomy and right maxillary antrostomy was performed by identifying the uncinate  process and this was taken down using the microdebrider. The medial wall of the maxillary sinus was then punctured and then the medial wall of the right maxillary sinus was then carefully taken down using a combination of the microdebrider, backbiter, and a straight Tru- Cut forceps.  I came anteriorly and widely opened the natural ostium of the right maxillary sinus using the backbiter and a 90-Blakesley.  The right maxillary sinus was then suctioned out.  There was noted to be some moderate polypoid edema and chronic inflammation of the right maxillary sinus consistent with the patient's preoperative CT scan.  Once septoplasty and right maxillary antrostomy were completed, I turned to bilateral inferior turbinate reduction.  This was performed by elevating a submucoperiosteal flap on the turbinate bilaterally elevating the mucosa off the chondral bone.  I then performed submucous resection using the microdebrider bilaterally by debriding the submucous erectile tissue on the inferior turbinates.  Once this was completed, the submucoperiosteal turbinate pockets were suctioned out and thoroughly cauterized using the suction Bovie and then the flaps were reapproximated down to the tonsillar bone.  Once the nasopharynx was irrigated and suctioned out bilaterally, the right side of the septum was bolstered with an 8-cm Merocel sponge covered with a glove to form a Finger Cot.  This was secured to the patient's face with Benzoin and Steri-Strips and a 2-0 silk tie.  On the left side, a standard Doyle splint was placed, which was secured to the septum using a transcolumellar through-and-through 3-0 Prolene suture.  The stomach was suctioned out with flexible suction catheter.  The patient was turned back to anesthesia, awakened from anesthesia and extubated without incident.  The patient tolerated the procedure well with no immediate complications.  Dr. Melvenia Beam was present and performed  the entire procedure.          ______________________________ Melvenia Beam, MD     MG/MEDQ  D:  08/09/2011  T:  08/10/2011  Job:  [id number]  Electronically Signed by Melvenia Beam MD on 08/13/2011 11:23:44 AM

## 2011-08-13 NOTE — Discharge Summary (Signed)
  Gordon Madden, SAGE NO.:  [id number]  MEDICAL RECORD NO.:  [id number]  LOCATION:  5148                         FACILITY:  [facility]  PHYSICIAN:  Melvenia Beam, MD      DATE OF BIRTH:  [birth year]  DATE OF ADMISSION:  08/09/2011 DATE OF DISCHARGE:  08/10/2011                              DISCHARGE SUMMARY   DISCHARGING PHYSICIAN:  Melvenia Beam, MD  ADMISSION DIAGNOSES:  History of obstructive sleep apnea, hypertension, severe nasal obstruction, septal deviation status post septoplasty, right maxillary antrostomy for chronic rhinosinusitis and inferior turbinate reduction.  HOSPITAL COURSE:  This is a very pleasant 60 year old Caucasian male who has a history of chronic right maxillary sinusitis, septal deviation, and inferior turbinate hypertrophy.  He was taken to the operating room on August 09, 2011 for endoscopic septoplasty, endoscopic inferior turbinate reduction, and right maxillary antrostomy.  He did well postoperatively.  He was taking a regular diet and stable oxygen saturations overnight.  He was monitored on the floor and was discharged on postoperative day #1 in good condition.  DISCHARGE MEDICATIONS: 1. Vicodin 5/500 mg, 1-2 tabs p.o. q.4-6 h. p.r.n. pain, dispensed     #55. 2. Keflex 500 mg 1 tab p.o. t.i.d. 3. Zofran 4 mg q.4 h. p.r.n. nausea. 4. He can also restart all of his home meds except for his Plavix and     aspirin which he should hold until next Thursday, August 16, 2011 given the risk of postoperative bleeding.  DIET:  Regular diabetic diet as tolerated.  ACTIVITY:  Ad lib.  No heavy lifting greater than 25 pounds for 2 weeks. No strenuous activity and no nose blowing.  WOUND CARE:  He can use over-the-counter nasal saline spray 2 sprays every 2 hours in each nostril and he can use Afrin 2 sprays in each nostril q.8 h. p.r.n. bleeding for no more than 4 days postoperatively.  FOLLOWUP:  He will return to see Dr.  Emeline Darling in 1 week for exam and removal of his stents.  He will leave his nasal sponge in place until he is seen by Dr. Emeline Darling.  He should call for any epistaxis, any difficulty breathing, fevers, or any other postoperative problems or questions.          ______________________________ Melvenia Beam, MD     MG/MEDQ  D:  08/10/2011  T:  08/10/2011  Job:  [id number]  Electronically Signed by Melvenia Beam MD on 08/13/2011 11:25:05 AM

## 2011-08-13 NOTE — H&P (Signed)
NAMESUBHAN, HOOPES NO.:  [id number]  MEDICAL RECORD NO.:  [id number]  LOCATION:  5148                         FACILITY:  [facility]  PHYSICIAN:  Melvenia Beam, MD      DATE OF BIRTH:  05/09/1951  DATE OF ADMISSION:  08/09/2011 DATE OF DISCHARGE:  08/10/2011                             HISTORY & PHYSICAL   REASON FOR ADMISSION:  The patient has a history of hypertension as well as mild-to-moderate obstructive sleep apnea with an AHI of 14.9 overall and an REM, AHI of approximately 38.  He also has a history of severe septal deviation nasal obstruction, inferior turbinate hypertrophy and right maxillary chronic sinusitis and for this reason, he was taken to the operating room on August 09, 2011, for endoscopic septoplasty, bilateral inferior turbinate reduction, right maxillary antrostomy and he was admitted for postoperative monitoring, given his comorbidities.  PAST MEDICAL AND SURGICAL HISTORY:  Again, is significant for obstructive sleep apnea, chronic rhinitis, septal deviation and hypersomnia.  SOCIAL HISTORY:  He smokes cigars approximately 7 per week.  He formerly smoked cigarettes 3 packs per day starting in 1967 and quitting in 1991. He currently does not smoke cigarettes anymore, but does smoke about one cigar per day, history is also significant for hypertension.  HOME MEDICATIONS: 1. He takes Avodart 0.5 mg once daily. 2. Carvedilol 6.25 mg once daily. 3. Flomax 0.4 mg once daily. 4. Lisinopril 10 mg once daily. 5. Glucosamine chondroitin 1 tablet b.i.d. 6. Plavix 75 mg by mouth once daily. 7. Aspirin 81 mg by mouth once daily. 8. Folic acid 400 mcg by mouth once daily. 9. Fish oil 1200 mg to take 2 by mouth once daily. 10.Multivitamin 1 by mouth once daily. 11.Omnaris 50 mcg 2 sprays each nostril at bedtime. 12.He uses 10 mg of water on CPAP. 13.He also takes Nuvigil 150 mg once daily as needed.  ALLERGIES:  He has no known drug  allergies.  PAST MEDICAL AND SURGICAL HISTORY:  Significant for CVA/stroke, retinal artery branch occlusion, benign prostatic hypertrophy, hypertension, myocardial infarction, cardiomyopathy with an ejection fraction of 40%, chronic rhinosinusitis, diabetes mellitus type 2, colon polyps, chronic thrombocytopenia and obstructive sleep apnea with an AHI of 14.9.  He has a past surgical history of left knee meniscus repair and umbilical hernia.  FAMILY HISTORY:  Noncontributory and he has 1 or 2 drinks of alcohol per week.  PHYSICAL EXAMINATION:  This is a 60 year old gentleman who appears his stated age, in no acute distress.  He is awake and alert.  Weight is 340 pounds.  BMI is 46.  Oxygen saturations are greater than 95% on humidified oxygen by face mask.  Blood pressure is 132/84, pulse is approximately 80 and height is 72 inches.  Neck is supple.  Trachea is midline.  Tympanic membranes are bright and clear bilaterally.  Nasal cavity demonstrates a Doyle split surrounded to the left side of the septum in the left, nasopharynx in the right, nasopharynx has a variceal which is taped to the patient's cheek.  Oral cavity is clear.  Cranial nerves II through XII grossly intact symmetric bilaterally.  Extraocular movements are intact.  Pupils are equal,  round and reactive to light and accommodation.  He has 5/5 strength bilaterally.  Lungs were grossly clear.  Heart is regular rate and rhythm.  No murmurs, gallops or rubs heard.  IMPRESSION AND PLAN:  We will continue his home medications.  He will be admitted postoperatively for postop monitoring, given his obstructive sleep apnea and we will plan on discharging him in the morning if he is doing well taking p.o.  He will be advanced to regular diet.  He will have light activity with no heavy lifting.  We will have him hold his Plavix and aspirin and we will monitor him postoperatively.           ______________________________ Melvenia Beam, MD     MG/MEDQ  D:  08/09/2011  T:  08/10/2011  Job:  [id number]  Electronically Signed by Melvenia Beam MD on 08/13/2011 11:24:48 AM

## 2011-12-05 ENCOUNTER — Encounter: Payer: Self-pay | Admitting: Internal Medicine

## 2011-12-05 ENCOUNTER — Ambulatory Visit (INDEPENDENT_AMBULATORY_CARE_PROVIDER_SITE_OTHER): Payer: PRIVATE HEALTH INSURANCE | Admitting: Internal Medicine

## 2011-12-05 VITALS — BP 140/90 | HR 90 | Ht 72.0 in | Wt 332.0 lb

## 2011-12-05 DIAGNOSIS — G4710 Hypersomnia, unspecified: Secondary | ICD-10-CM

## 2011-12-05 DIAGNOSIS — J328 Other chronic sinusitis: Secondary | ICD-10-CM

## 2011-12-05 DIAGNOSIS — G4730 Sleep apnea, unspecified: Secondary | ICD-10-CM

## 2011-12-05 DIAGNOSIS — G4733 Obstructive sleep apnea (adult) (pediatric): Secondary | ICD-10-CM

## 2011-12-05 NOTE — Progress Notes (Signed)
Patient ID: Gordon Madden, male    DOB: 1951-04-17, 61 y.o.   MRN: [id number]  HPI 08/01/11 47 yoM smoker followed for hypersomnia with sleep apnea, rhinitis, complicated by DM, HBP, CAD/MI, CVA/stroke    Dr Maurice Small PCP Last here 12/14/2010 OSA -CPAP 10 [facility]-using all night every night. New visual has helped for breakthrough daytime sleepiness. Scheduled for sinus surgery a week from today with Dr Emeline Darling ENT. Sinus pain began two months ago. CT positive after several antibiotics. Dx'd sinusitis with septal deviation. He had concerns about use of CPAP after the surgery and we discussed - he will probably have to skip CPAP briefly. He felt comfortably leaving off CPAP for a few days while travelling, otherwise he has used it regularly.  Denies chest tightness, cough or increased shortness of breath. Newly dx'd DMII by his PCP. He is walking 40 minutes daily.  Still smoking 1 cigar daily- not inhaling. Quit his 3ppd cigarette habit 20 years ago.   12/05/11- 60 yoM smoker followed for hypersomnia with sleep apnea, rhinitis, complicated by DM, HBP, CAD/MI, CVA/stroke    Dr Maurice Small PCP He had sinus surgery by Dr. Emeline Darling. 5 says he is not snoring without his CPAP but he still feels better, sleeps better, wearing CPAP. No sinus infections or colds since the surgery and otherwise doing well. He does admit he has gained 8 pounds with the holidays. CPAP pressure is set at 10 and has used all night every night.  Review of Systems Constitutional:   No-   weight loss, night sweats, fevers, chills, fatigue, lassitude. HEENT:   No-  headaches, difficulty swallowing, tooth/dental problems, sore throat,       +  sneezing, itching, ear ache, nasal congestion, post nasal drip,  CV:  No-   chest pain, orthopnea, PND, swelling in lower extremities, anasarca, dizziness, palpitations Resp: No- acute  shortness of breath with exertion or at rest.              No-   productive cough,  No non-productive cough,   No-  coughing up of blood.              No-   change in color of mucus.  No- wheezing.   Skin: No-   rash or lesions. GI:  No-   heartburn, indigestion, abdominal pain, nausea, vomiting, diarrhea,                 change in bowel habits, loss of appetite GU:  MS:  No-   joint pain or swelling.  No- decreased range of motion.  No- back pain. Neuro- grossly normal to observation, Or:  Psych:  No- change in mood or affect. No depression or anxiety.  No memory loss.      Objective:   Physical Exam General- Alert, Oriented, Affect-appropriate, Distress- none acute;  Quite obese Skin- rash-none, lesions- none, excoriation- none Lymphadenopathy- none Head- atraumatic            Eyes- Gross vision intact, PERRLA, conjunctivae clear secretions            Ears- Hearing, canals normal            Nose- Clear, Septal dev- not apparent from anterior., mucus, polyps, erosion, perforation             Throat- Mallampati II-III , mucosa clear , drainage- none, tonsils- atrophic Neck- flexible , trachea midline, no stridor , thyroid nl, carotid no bruit Chest - symmetrical  excursion , unlabored           Heart/CV- RRR , no murmur , no gallop  , no rub, nl s1 s2                           - JVD- none , edema- none, stasis changes- none, varices- none           Lung- clear to P&A, wheeze- none, cough- none , dullness-none, rub- none           Chest wall-  Abd-  Br/ Gen/ Rectal- Not done, not indicated Extrem- cyanosis- none, clubbing, none, atrophy- none, strength- nl Neuro- grossly intact to observation

## 2011-12-05 NOTE — Patient Instructions (Signed)
Order- [facility]- autotitrate CPAP 5-15 x 1 week for pressure recommendation  Dx OSA

## 2011-12-08 NOTE — Assessment & Plan Note (Signed)
He still feels better using CPAP so his sinus surgery did not completely remove the need. We will reassess pressures with an auto titration.

## 2011-12-08 NOTE — Assessment & Plan Note (Signed)
Sinus surgery 2012-Dr. Dolphus Jenny

## 2012-01-18 ENCOUNTER — Telehealth: Payer: Self-pay | Admitting: Internal Medicine

## 2012-01-18 DIAGNOSIS — G4733 Obstructive sleep apnea (adult) (pediatric): Secondary | ICD-10-CM

## 2012-01-18 NOTE — Telephone Encounter (Signed)
[email or web address]  

## 2012-01-18 NOTE — Telephone Encounter (Signed)
Results received and placed on CDY's cart for review.  Dr. Maple Hudson, pls advise.  Thanks!

## 2012-01-18 NOTE — Telephone Encounter (Signed)
Pt states he [facility] did a download approx 1 month ago to see which pressure he needed to be on but still has not heard anything regarding these results.  I apologized for this delay and advised I will send message to Dr. Maple Hudson for results.    I do not see these scanned into chart.  Called [facility], spoke with [place].  She will fax results to triage.

## 2012-01-21 NOTE — Telephone Encounter (Signed)
Please order- [facility] set fixed CPAP at 9 cwp    Dx OSA

## 2012-01-21 NOTE — Telephone Encounter (Signed)
Pt aware and order placed.Gordon Madden, CMA  

## 2012-03-31 ENCOUNTER — Ambulatory Visit (INDEPENDENT_AMBULATORY_CARE_PROVIDER_SITE_OTHER): Payer: PRIVATE HEALTH INSURANCE | Admitting: Internal Medicine

## 2012-03-31 ENCOUNTER — Encounter: Payer: Self-pay | Admitting: Internal Medicine

## 2012-03-31 VITALS — BP 110/72 | HR 97 | Ht 72.0 in | Wt 336.8 lb

## 2012-03-31 DIAGNOSIS — J328 Other chronic sinusitis: Secondary | ICD-10-CM

## 2012-03-31 DIAGNOSIS — G4730 Sleep apnea, unspecified: Secondary | ICD-10-CM

## 2012-03-31 DIAGNOSIS — G4710 Hypersomnia, unspecified: Secondary | ICD-10-CM

## 2012-03-31 NOTE — Patient Instructions (Addendum)
We will call [facility] to check their records for your CPAP pressure setting.   Please call as needed

## 2012-03-31 NOTE — Progress Notes (Signed)
Patient ID: Gordon Madden, male    DOB: [birth year], 61 y.o.   MRN: [id number]  HPI 08/01/11 61 yoM smoker followed for hypersomnia with sleep apnea, rhinitis, complicated by DM, HBP, CAD/MI, CVA/stroke    Dr Maurice Small PCP Last here 12/14/2010 OSA -CPAP 10 [facility]-using all night every night. New visual has helped for breakthrough daytime sleepiness. Scheduled for sinus surgery a week from today with Dr Emeline Darling ENT. Sinus pain began two months ago. CT positive after several antibiotics. Dx'd sinusitis with septal deviation. He had concerns about use of CPAP after the surgery and we discussed - he will probably have to skip CPAP briefly. He felt comfortably leaving off CPAP for a few days while travelling, otherwise he has used it regularly.  Denies chest tightness, cough or increased shortness of breath. Newly dx'd DMII by his PCP. He is walking 40 minutes daily.  Still smoking 1 cigar daily- not inhaling. Quit his 3ppd cigarette habit 20 years ago.   12/05/11- 61 yoM smoker followed for hypersomnia with sleep apnea, rhinitis, complicated by DM, HBP, CAD/MI, CVA/stroke    Dr Maurice Small PCP He had sinus surgery by Dr. Emeline Darling. 5 says he is not snoring without his CPAP but he still feels better, sleeps better, wearing CPAP. No sinus infections or colds since the surgery and otherwise doing well. He does admit he has gained 8 pounds with the holidays. CPAP pressure is set at 10 and has used all night every night.  03/31/12-  61 yoM smoker followed for hypersomnia with sleep apnea, rhinitis, complicated by DM, HBP, CAD/MI, CVA/stroke     Dr Maurice Small PCP  Uses CPAP every night for approx 7 hours; pressure working well for [facility] is DME company. 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 Taking Allegra for seasonal rhinitis.  Review of Systems-see HPI Constitutional:   No-   weight loss, night sweats, fevers, chills, fatigue, lassitude. HEENT:   No-   headaches, difficulty swallowing, tooth/dental problems, sore throat,       +  sneezing, itching, ear ache, nasal congestion, post nasal drip,  CV:  No-   chest pain, orthopnea, PND, swelling in lower extremities, anasarca, dizziness, palpitations Resp: No- acute  shortness of breath with exertion or at rest.              No-   productive cough,  No non-productive cough,  No-  coughing up of blood.              No-   change in color of mucus.  No- wheezing.   Skin: No-   rash or lesions. GI:  No-   heartburn, indigestion, abdominal pain, nausea,  GU:  MS:  No-   joint pain or swelling. . Neuro- nothing unusual  Psych:  No- change in mood or affect. No depression or anxiety.  No memory loss.      Objective:   Physical Exam General- Alert, Oriented, Affect-appropriate, Distress- none acute;  Quite obese Skin- rash-none, lesions- none, excoriation- none Lymphadenopathy- none Head- atraumatic            Eyes- Gross vision intact, PERRLA, conjunctivae clear secretions            Ears- Hearing, canals normal            Nose- Clear, Septal dev- not apparent from anterior., mucus, polyps, erosion, perforation             Throat- Mallampati II-III ,  mucosa clear , drainage- none, tonsils- atrophic Neck- flexible , trachea midline, no stridor , thyroid nl, carotid no bruit Chest - symmetrical excursion , unlabored           Heart/CV- RRR , no murmur , no gallop  , no rub, nl s1 s2                           - JVD- none , edema- none, stasis changes- none, varices- none           Lung- clear to P&A, wheeze- none, cough- none , dullness-none, rub- none           Chest wall-  Abd-  Br/ Gen/ Rectal- Not done, not indicated Extrem- cyanosis- none, clubbing, none, atrophy- none, strength- nl Neuro- grossly intact to observation

## 2012-04-04 ENCOUNTER — Encounter: Payer: Self-pay | Admitting: Internal Medicine

## 2012-04-04 NOTE — Assessment & Plan Note (Signed)
Exam is the same except he has put on more weight. He still seems compliant and well controlled with CPAP pressures ? 7/[facility]. We talked about effective way change on pressure requirement and mask fit. We need to verify pressure setting with [facility].

## 2012-04-04 NOTE — Assessment & Plan Note (Signed)
Seasonal rhinitis symptoms are controlled with antihistamines.

## 2012-06-17 ENCOUNTER — Other Ambulatory Visit: Payer: Self-pay | Admitting: Internal Medicine

## 2012-06-17 NOTE — Telephone Encounter (Signed)
Ok to refill Nuvigil as requested

## 2012-06-17 NOTE — Telephone Encounter (Signed)
Refill request fax received for Patient.  Last ov 03/31/12 Last Rx written 07/31/11  Nuvigil 150mg  1po qd prn #30- 90 day supply [address].   Please advise Dr. Maple Hudson if refill okay, Thanks.

## 2012-06-18 MED ORDER — ARMODAFINIL 150 MG PO TABS: 150.0000 mg | ORAL_TABLET | Freq: Every day | ORAL | Status: DC | PRN

## 2012-06-18 NOTE — Telephone Encounter (Signed)
[facility]ill [facility] to [facility] and per Shanda Bumps at the pharmacy this is for a 30 day supply not 90.

## 2012-10-21 ENCOUNTER — Encounter (INDEPENDENT_AMBULATORY_CARE_PROVIDER_SITE_OTHER): Payer: Self-pay | Admitting: General Surgery

## 2012-10-30 ENCOUNTER — Ambulatory Visit (INDEPENDENT_AMBULATORY_CARE_PROVIDER_SITE_OTHER): Payer: PRIVATE HEALTH INSURANCE | Admitting: General Surgery

## 2012-10-30 ENCOUNTER — Encounter (INDEPENDENT_AMBULATORY_CARE_PROVIDER_SITE_OTHER): Payer: Self-pay | Admitting: General Surgery

## 2012-10-30 VITALS — BP 132/80 | HR 68 | Temp 97.2°F | Resp 16 | Ht 71.5 in | Wt 323.2 lb

## 2012-10-30 DIAGNOSIS — M6200 Separation of muscle (nontraumatic), unspecified site: Secondary | ICD-10-CM

## 2012-10-30 DIAGNOSIS — M6208 Separation of muscle (nontraumatic), other site: Secondary | ICD-10-CM

## 2012-10-30 NOTE — Progress Notes (Signed)
Subjective:     Patient ID: Gordon Madden, male   DOB: September 11, 1951, 61 y.o.   MRN: [id number]  HPI The patient is a 61 year old male who is concerned for a umbilical hernia recurrence. Patient that several weeks ago he noticed some bruising in his umbilicus. He proceeded to the urgent care Center and was followup with his PCP. Patient stated the bruising and pain in his umbilicus when away and a day and has not recurred. He had no nausea no vomiting and had no generalized abdominal pain. His preassembled hernia repair several years ago in [place].  Review of Systems  All other systems reviewed and are negative.       Objective:   Physical Exam  Constitutional: He is oriented to person, place, and time. He appears well-developed and well-nourished.  HENT:  Head: Normocephalic and atraumatic.  Eyes: Conjunctivae normal and EOM are normal.  Neck: Normal range of motion. Neck supple.  Cardiovascular: Normal rate and regular rhythm.   Pulmonary/Chest: Breath sounds normal.  Abdominal: Soft. Bowel sounds are normal. He exhibits no mass. There is no tenderness. There is no rebound and no guarding.    Musculoskeletal: Normal range of motion.  Neurological: He is alert and oriented to person, place, and time.       Assessment:     61 year old male with rectus diastases    Plan:     1. At this point I do not feel a recurrent umbilical hernia. He does have rectus diastases which is nonoperative. 2.  I did discuss with the patient the signs and symptoms of name incarcerated/granulated umbilical hernia and the need to proceed to ER for possible evaluation a CT scan at that time. The patient voiced understanding.

## 2012-12-18 ENCOUNTER — Encounter (INDEPENDENT_AMBULATORY_CARE_PROVIDER_SITE_OTHER): Payer: Self-pay

## 2013-02-19 ENCOUNTER — Other Ambulatory Visit: Payer: Self-pay | Admitting: Internal Medicine

## 2013-02-19 ENCOUNTER — Telehealth: Payer: Self-pay | Admitting: Internal Medicine

## 2013-02-19 MED ORDER — ARMODAFINIL 150 MG PO TABS: 150.0000 mg | ORAL_TABLET | Freq: Every day | ORAL | Status: DC | PRN

## 2013-02-19 NOTE — Telephone Encounter (Signed)
I spoke with pt advised rx was called in 02/19/13 #30 x 5 refills. He voiced his understanding. Nothing further was needed

## 2013-02-19 NOTE — Telephone Encounter (Signed)
Per CY-okay to refill x 5. Called Rx to pharmacy voicemail.

## 2013-03-31 ENCOUNTER — Ambulatory Visit (INDEPENDENT_AMBULATORY_CARE_PROVIDER_SITE_OTHER): Payer: PRIVATE HEALTH INSURANCE | Admitting: Internal Medicine

## 2013-03-31 ENCOUNTER — Encounter: Payer: Self-pay | Admitting: Internal Medicine

## 2013-03-31 VITALS — BP 124/74 | HR 61 | Ht 72.0 in | Wt 333.4 lb

## 2013-03-31 DIAGNOSIS — G4730 Sleep apnea, unspecified: Secondary | ICD-10-CM

## 2013-03-31 DIAGNOSIS — G4710 Hypersomnia, unspecified: Secondary | ICD-10-CM

## 2013-03-31 NOTE — Progress Notes (Signed)
Patient ID: Gordon Madden, male    DOB: 09-Dec-1950, 63 y.o.   MRN: [id number]  HPI 08/01/11 50 yoM smoker followed for hypersomnia with sleep apnea, rhinitis, complicated by DM, HBP, CAD/MI, CVA/stroke    Dr Maurice Small PCP Last here 12/14/2010 OSA -CPAP 10 [facility]-using all night every night. New visual has helped for breakthrough daytime sleepiness. Scheduled for sinus surgery a week from today with Dr Emeline Darling ENT. Sinus pain began two months ago. CT positive after several antibiotics. Dx'd sinusitis with septal deviation. He had concerns about use of CPAP after the surgery and we discussed - he will probably have to skip CPAP briefly. He felt comfortably leaving off CPAP for a few days while travelling, otherwise he has used it regularly.  Denies chest tightness, cough or increased shortness of breath. Newly dx'd DMII by his PCP. He is walking 40 minutes daily.  Still smoking 1 cigar daily- not inhaling. Quit his 3ppd cigarette habit 20 years ago.   12/05/11- 60 yoM smoker followed for hypersomnia with sleep apnea, rhinitis, complicated by DM, HBP, CAD/MI, CVA/stroke    Dr Maurice Small PCP He had sinus surgery by Dr. Emeline Darling. 5 says he is not snoring without his CPAP but he still feels better, sleeps better, wearing CPAP. No sinus infections or colds since the surgery and otherwise doing well. He does admit he has gained 8 pounds with the holidays. CPAP pressure is set at 10 and has used all night every night.  03/31/12-  60 yoM  Cigar smoker followed for hypersomnia with sleep apnea, rhinitis, complicated by DM, HBP, CAD/MI, CVA/stroke     Dr Maurice Small PCP  Uses CPAP every night for approx 7 hours; pressure working well for [facility] is DME company. 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 Taking Allegra for seasonal rhinitis.  03/31/13- 60 yoM Cigar smoker followed for hypersomnia with sleep apnea, rhinitis, complicated by DM, HBP, CAD/MI, CVA/stroke   FOLLOWS FOR: wears CPAP 10/ [facility] every night for about 7 hours and pressure working well for patient;    We discussed usual guidelines for replacement of machine and mask. Rare use Nuvigil, and usually then will take part of a pill. Little pollen allergy this spring.   Review of Systems-see HPI Constitutional:   No-   weight loss, night sweats, fevers, chills, fatigue, lassitude. HEENT:   No-  headaches, difficulty swallowing, tooth/dental problems, sore throat,      No-sneezing, itching, ear ache, nasal congestion, post nasal drip,  CV:  No-   chest pain, orthopnea, PND, swelling in lower extremities, anasarca, dizziness, palpitations Resp: No- acute  shortness of breath with exertion or at rest.              No-   productive cough,  No non-productive cough,  No-  coughing up of blood.              No-   change in color of mucus.  No- wheezing.   Skin: No-   rash or lesions. GI:  No-   heartburn, indigestion, abdominal pain, nausea,  GU:  MS:  No-   joint pain or swelling. . Neuro- nothing unusual  Psych:  No- change in mood or affect. No depression or anxiety.  No memory loss.  Objective:   Physical Exam General- Alert, Oriented, Affect-appropriate, Distress- none acute;  Quite obese Skin- cool, clammy Lymphadenopathy- none Head- atraumatic  Eyes- Gross vision intact, PERRLA, conjunctivae clear secretions            Ears- Hearing, canals normal            Nose- Clear, Septal dev- not apparent from anterior., mucus, polyps, erosion, perforation             Throat- Mallampati III , mucosa clear , drainage- none, tonsils- atrophic Neck- flexible , trachea midline, no stridor , thyroid nl, carotid no bruit Chest - symmetrical excursion , unlabored           Heart/CV- RRR , no murmur , no gallop  , no rub, nl s1 s2                           - JVD- none , edema- none, stasis changes- none, varices- none           Lung- clear to P&A, wheeze- none, cough- none ,  dullness-none, rub- none           Chest wall-  Abd-  Br/ Gen/ Rectal- Not done, not indicated Extrem- cyanosis- none, clubbing, none, atrophy- none, strength- nl Neuro- grossly intact to observation

## 2013-03-31 NOTE — Assessment & Plan Note (Signed)
Very good compliance and control with CPAP. Weight loss would help and he understands this. No changes in CPAP needed.

## 2013-03-31 NOTE — Patient Instructions (Addendum)
We can continue CPAP 10/ [facility]  Ok to continue Nuvigil when needed  Please call as needed

## 2013-09-29 ENCOUNTER — Other Ambulatory Visit: Payer: Self-pay | Admitting: Internal Medicine

## 2013-09-29 NOTE — Telephone Encounter (Signed)
Ok to refill Nuvigil as before

## 2013-09-29 NOTE — Telephone Encounter (Signed)
Please advise if okay to refill. Thanks.  

## 2014-01-19 ENCOUNTER — Telehealth: Payer: Self-pay | Admitting: Interventional Cardiology

## 2014-01-19 NOTE — Telephone Encounter (Signed)
Received request from Nurse fax box, documents faxed for surgical clearance. °To: [facility]  °Fax number: [phone] °Attention °[id number]/kdm: °

## 2014-03-16 ENCOUNTER — Telehealth: Payer: Self-pay | Admitting: Interventional Cardiology

## 2014-03-16 NOTE — Telephone Encounter (Signed)
Called [facility] and let them know clearance was faxed on 01/19/14.

## 2014-03-16 NOTE — Telephone Encounter (Signed)
New Message  Pamala Hurry with [facility] Called. She states a fax was sent in February requesting to have the Pt come off of Plavix. Please call back to confirm clearance- colonoscopy for a history of polyps/ Please assist

## 2014-03-22 ENCOUNTER — Other Ambulatory Visit: Payer: Self-pay | Admitting: Gastroenterology

## 2014-03-31 ENCOUNTER — Ambulatory Visit (INDEPENDENT_AMBULATORY_CARE_PROVIDER_SITE_OTHER): Payer: Managed Care, Other (non HMO) | Admitting: Internal Medicine

## 2014-03-31 ENCOUNTER — Encounter: Payer: Self-pay | Admitting: Internal Medicine

## 2014-03-31 ENCOUNTER — Encounter (INDEPENDENT_AMBULATORY_CARE_PROVIDER_SITE_OTHER): Payer: Self-pay

## 2014-03-31 VITALS — BP 118/72 | HR 69 | Ht 72.0 in | Wt 332.0 lb

## 2014-03-31 DIAGNOSIS — F17200 Nicotine dependence, unspecified, uncomplicated: Secondary | ICD-10-CM

## 2014-03-31 DIAGNOSIS — G4710 Hypersomnia, unspecified: Secondary | ICD-10-CM

## 2014-03-31 DIAGNOSIS — G4700 Insomnia, unspecified: Secondary | ICD-10-CM

## 2014-03-31 DIAGNOSIS — G4730 Sleep apnea, unspecified: Secondary | ICD-10-CM

## 2014-03-31 MED ORDER — TEMAZEPAM 15 MG PO CAPS: 15.0000 mg | ORAL_CAPSULE | Freq: Every evening | ORAL | Status: DC | PRN

## 2014-03-31 NOTE — Patient Instructions (Signed)
We can continue CPAP 10/ [facility]  Script printed for temazepam to use 1-2 at bedtime for sleep as needed

## 2014-03-31 NOTE — Progress Notes (Signed)
Patient ID: Gordon Madden, male    DOB: Mar 22, 1951, 63 y.o.   MRN: [id number]  HPI 08/01/11 22 yoM smoker followed for hypersomnia with sleep apnea, rhinitis, complicated by DM, HBP, CAD/MI, CVA/stroke    Dr Kelton Pillar PCP Last here 12/14/2010 OSA -CPAP 10 [facility]-using all night every night. New visual has helped for breakthrough daytime sleepiness. Scheduled for sinus surgery a week from today with Dr Simeon Craft ENT. Sinus pain began two months ago. CT positive after several antibiotics. Dx'd sinusitis with septal deviation. He had concerns about use of CPAP after the surgery and we discussed - he will probably have to skip CPAP briefly. He felt comfortably leaving off CPAP for a few days while travelling, otherwise he has used it regularly.  Denies chest tightness, cough or increased shortness of breath. Newly dx'd DMII by his PCP. He is walking 40 minutes daily.  Still smoking 1 cigar daily- not inhaling. Quit his 3ppd cigarette habit 20 years ago.   12/05/11- 60 yoM smoker followed for hypersomnia with sleep apnea, rhinitis, complicated by DM, HBP, CAD/MI, CVA/stroke    Dr Kelton Pillar PCP He had sinus surgery by Dr. Simeon Craft. 5 says he is not snoring without his CPAP but he still feels better, sleeps better, wearing CPAP. No sinus infections or colds since the surgery and otherwise doing well. He does admit he has gained 8 pounds with the holidays. CPAP pressure is set at 10 and has used all night every night.  03/31/12-  45 yoM  Cigar smoker followed for hypersomnia with sleep apnea, rhinitis, complicated by DM, HBP, CAD/MI, CVA/stroke     Dr Kelton Pillar PCP  Uses CPAP every night for approx 7 hours; pressure working well for [facility] is DME company. 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 Taking Allegra for seasonal rhinitis.  03/31/13- 85 yoM Cigar smoker followed for hypersomnia with sleep apnea, rhinitis, complicated by DM, HBP, CAD/MI, CVA/stroke   FOLLOWS FOR: wears CPAP 10/ [facility] every night for about 7 hours and pressure working well for patient;    We discussed usual guidelines for replacement of machine and mask. Rare use Nuvigil, and usually then will take part of a pill. Little pollen allergy this spring.   03/31/14- 37 yoM Cigar smoker followed for hypersomnia with sleep apnea, rhinitis, complicated by DM, HBP, CAD/MI, CVA/stroke  FOLLOWS FOR:  Wearing CPAP 10/ [facility] 6-7 hours per night.  Pressure doing well New pain right anterolateral chest for the past 4 or 5 weeks, radiating around to the back on right side and scored 5/10 "like indigestion" with nausea. Constant aching for the last several hours finally resolve spontaneously. Primary physician questions gallbladder. Breathing has been okay. Pain is not pleuritic it is not hurting now. Insomnia-asks about getting sleep medicine for "busy brain"-discussed.   Review of Systems-see HPI Constitutional:   No-   weight loss, night sweats, fevers, chills, fatigue, lassitude. HEENT:   No-  headaches, difficulty swallowing, tooth/dental problems, sore throat,      No-sneezing, itching, ear ache, nasal congestion, post nasal drip,  CV:  +chest pain, orthopnea, PND, swelling in lower extremities, anasarca, dizziness, palpitations Resp: No- acute  shortness of breath with exertion or at rest.              No-   productive cough,  No non-productive cough,  No-  coughing up of blood.              No-  change in color of mucus.  No- wheezing.   Skin: No-   rash or lesions. GI:  No-   heartburn, indigestion, abdominal pain, +nausea,  GU:  MS:  No-   joint pain or swelling. . Neuro- nothing unusual  Psych:  No- change in mood or affect. No depression or anxiety.  No memory loss.  Objective:   Physical Exam BP 118/72  Pulse 69  Ht 6' (1.829 m)  Wt 332 lb (150.594 kg)  BMI 45.02 kg/m2  SpO2 96%  General- Alert, Oriented, Affect-appropriate, Distress- none acute;  Quite  obese Skin- cool, clammy Lymphadenopathy- none Head- atraumatic            Eyes- Gross vision intact, PERRLA, conjunctivae clear secretions            Ears- Hearing, canals normal            Nose- Clear, Septal dev- not apparent from anterior., mucus, polyps, erosion, perforation             Throat- Mallampati III , mucosa clear , drainage- none, tonsils- atrophic Neck- flexible , trachea midline, no stridor , thyroid nl, carotid no bruit Chest - symmetrical excursion , unlabored           Heart/CV- RRR , no murmur , no gallop  , no rub, nl s1 s2                           - JVD- none , edema- none, stasis changes- none, varices- none           Lung- clear to P&A, wheeze- none, cough- none , dullness-none, rub- none           Chest wall-  Abd-  Br/ Gen/ Rectal- Not done, not indicated Extrem- cyanosis- none, clubbing, none, atrophy- none, strength- nl Neuro- grossly intact to observation

## 2014-04-26 ENCOUNTER — Encounter: Payer: Self-pay | Admitting: Interventional Cardiology

## 2014-04-26 ENCOUNTER — Ambulatory Visit (INDEPENDENT_AMBULATORY_CARE_PROVIDER_SITE_OTHER): Payer: Managed Care, Other (non HMO) | Admitting: Interventional Cardiology

## 2014-04-26 VITALS — BP 142/84 | HR 62 | Ht 71.0 in | Wt 333.0 lb

## 2014-04-26 DIAGNOSIS — R0602 Shortness of breath: Secondary | ICD-10-CM

## 2014-04-26 DIAGNOSIS — I10 Essential (primary) hypertension: Secondary | ICD-10-CM

## 2014-04-26 DIAGNOSIS — I2510 Atherosclerotic heart disease of native coronary artery without angina pectoris: Secondary | ICD-10-CM | POA: Insufficient documentation

## 2014-04-26 DIAGNOSIS — I059 Rheumatic mitral valve disease, unspecified: Secondary | ICD-10-CM

## 2014-04-26 DIAGNOSIS — E669 Obesity, unspecified: Secondary | ICD-10-CM

## 2014-04-26 DIAGNOSIS — F17200 Nicotine dependence, unspecified, uncomplicated: Secondary | ICD-10-CM

## 2014-04-26 DIAGNOSIS — R079 Chest pain, unspecified: Secondary | ICD-10-CM

## 2014-04-26 LAB — BASIC METABOLIC PANEL
BUN: 21 mg/dL (ref 6–23)
CO2: 28 meq/L (ref 19–32)
Calcium: 8.9 mg/dL (ref 8.4–10.5)
Chloride: 102 mEq/L (ref 96–112)
Creatinine, Ser: 0.9 mg/dL (ref 0.4–1.5)
GFR: 96.18 mL/min (ref >60.00)
GLUCOSE: 160 mg/dL — AB (ref 70–99)
POTASSIUM: 4.7 meq/L (ref 3.5–5.1)
Sodium: 137 mEq/L (ref 135–145)

## 2014-04-26 LAB — BRAIN NATRIURETIC PEPTIDE: Pro B Natriuretic peptide (BNP): 48 pg/mL (ref 0.0–100.0)

## 2014-04-26 NOTE — Progress Notes (Signed)
Patient ID: Gordon Madden, male   DOB: [birth year], 63 y.o.   MRN: [id number]    [place] [place], [place]  [address] Phone: [phone] Fax:  [phone]  Date:  04/26/2014   ID:  Gordon Madden, DOB February 09, 1951, MRN [id number]  PCP:  Osborne Casco, MD      History of Present Illness: Tivon Lemoine is a 63 y.o. male with CAD. He will be having some facial surgery. He takes CoQ10 and restarted pravastatin. Chronic joint pains. 35 minute walk daily, but has stopped this recently do to knee pain. Started on metformin with increase in HbA1C. CAD/ASCVD:  Denies : Chest pain.  Dizziness.  Dyspnea on exertion.  Leg edema.  Nitroglycerin.  Palpitations resolved.  Syncope.    Had 2 episodes of right upper quadrant pain.  THought to be gall bladder attacks.  Avoiding fatty foods ECG was negative.  Going on to medicare.  This is stressful.  He notes some SHOB when lying down and going to sleep.  He thinks it is a panic attack.  He has not had to use more pillows.  He wakes up easily and cannot fall back asleep.  Slight improvement in breathing with sitting.  No extra swelling in legs.  He has not been walking as much due to knee pain.    Wt Readings from Last 3 Encounters:  04/26/14 233 lb (105.688 kg)  03/31/14 332 lb (150.594 kg)  03/31/13 333 lb 6.4 oz (151.229 kg)     Past Medical History  Diagnosis Date  . BPH (benign prostatic hyperplasia)   . CVA (cerebral infarction)     Renal artery branch occlusion  . Hypertension   . Myocardial infarction   . Cardiomyopathy     EF 40%  . Rhinosinusitis   . Diabetes mellitus, type 2   . Colon polyps   . Thrombocytopenia   . OSA (obstructive sleep apnea)     07/25/10 AHI 14.9/hr  . Stroke     Current Outpatient Prescriptions  Medication Sig Dispense Refill  . aspirin 81 MG tablet Take 81 mg by mouth daily.        . carvedilol (COREG) 6.25 MG tablet Take 6.25 mg by mouth daily.        . clopidogrel (PLAVIX) 75  MG tablet Take 75 mg by mouth daily.        . Coenzyme Q10 (EQL COQ10) 300 MG CAPS Take 1 capsule by mouth daily.        [name] dutasteride (AVODART) 0.5 MG capsule Take 0.5 mg by mouth daily.        . fexofenadine (ALLEGRA) 180 MG tablet Take 180 mg by mouth daily as needed.      . folic acid (FOLVITE) 389 MCG tablet Take 400 mcg by mouth daily.        [name] ibuprofen (ADVIL,MOTRIN) 200 MG tablet Take 400 mg by mouth every morning.      [name] lisinopril (PRINIVIL,ZESTRIL) 10 MG tablet Take 10 mg by mouth daily.        . metFORMIN (GLUCOPHAGE) 500 MG tablet Take 500 mg by mouth daily with breakfast.        . NUVIGIL 150 MG tablet TAKE 1 TABLET BY MOUTH DAILY AS NEEDED  30 tablet  5  . Omega-3 Fatty Acids (FISH OIL) 1200 MG CAPS Take 2 capsules by mouth daily.        . pravastatin (PRAVACHOL) 40 MG tablet Take 40 mg by  mouth daily.        . Tamsulosin HCl (FLOMAX) 0.4 MG CAPS Take 0.4 mg by mouth daily.        . temazepam (RESTORIL) 15 MG capsule Take 1 capsule (15 mg total) by mouth at bedtime as needed for sleep.  30 capsule  2   No current facility-administered medications for this visit.    Allergies:    Allergies  Allergen Reactions  . Plavix [Clopidogrel]     thrombocytopenia    Social History:  The patient  reports that he has been smoking Cigars.  He has never used smokeless tobacco. He reports that he drinks alcohol. He reports that he does not use illicit drugs.   Family History:  The patient's family history is not on file.   ROS:  Please see the history of present illness.  No nausea, vomiting.  No fevers, chills.  No focal weakness.  No dysuria. Shortness of breath a little worse with lying flat.  All other systems reviewed and negative.   PHYSICAL EXAM: VS:  BP 142/84  Pulse 62  Ht _0  (1.803 m)  Wt 233 lb (105.688 kg)  BMI 32.51 kg/m2 Well nourished, well developed, in no acute distress HEENT: normal Neck: no JVD, no carotid bruits Cardiac:  normal S1, S2; RRR;  Lungs:   clear to auscultation bilaterally, no wheezing, rhonchi or rales Abd: soft, nontender, no hepatomegaly Ext: no edema Skin: warm and dry Neuro:   no focal abnormalities noted      ASSESSMENT AND PLAN:  Coronary atherosclerosis of native coronary artery  IMAGING: EKG   Overton,Shana 07/24/2013 09:05:41 AM > Jafar Poffenberger,JAY 07/24/2013 09:31:27 AM > NSR, PRWP   Notes: No angina.  Walks daily without symptoms. prior abnormal stress test several years ago in 2008. Known occluded RCA.  Given the recent symptoms of upper abdominal pain and the concern for heart disease, will repeat pharmacologic stress test. He'll stay on his anti-anginal therapy.  2. Hypertension  Notes: Controlled.  Should improve also with lifestyle modifications.  3. Obesity, unspecified  Notes: Maintaining weight loss for the most part. COntinue to try to lose weight with regular exercise and diet control. He'll try to get under 300 pounds over the next 7-8 months.   Has not been able to lose weight in the last 6 months or so.  4. Hypercholesteremia, pure  Continue Pravastatin Sodium Tablet, 40 MG, 1 tablet, once a day Continue CoQ-10 Capsule, 100 MG, 1 capsule with a meal, Orally, Once a day Notes: LDL 75 at last check in 2013; LDL 78 in 2014.  5. Tobacco dependence  Notes: he continues to smoke cigars. Have advised him to stop. He feels that they help him relax and is not interested in quitting at this time.  6. Mitral Regurgitation  Notes: mitral regurgitation noted on prior echocardiogram. May be related to the inferior ischemia/infarct that he had noted on his prior stress test. Now that he has had some shortness of breath with lying flat. Will check BNP and be met. We'll also check echocardiogram to see if mitral regurgitation has gotten any worse.   Signed, Mina Marble, MD, [facility] 04/26/2014 10:42 AM

## 2014-04-26 NOTE — Patient Instructions (Addendum)
Your physician has requested that you have an echocardiogram. Echocardiography is a painless test that uses sound waves to create images of your heart. It provides your doctor with information about the size and shape of your heart and how well your heart's chambers and valves are working. This procedure takes approximately one hour. There are no restrictions for this procedure.  Your physician has requested that you have a lexiscan myoview. For further information please visit [email or web address]. Please follow instruction sheet, as given.   Your physician recommends that you return for lab work today for BNP and BMet.  Your physician recommends that you schedule a follow-up appointment in: 6 weeks with Dr. Irish Lack.

## 2014-04-30 ENCOUNTER — Ambulatory Visit: Payer: PRIVATE HEALTH INSURANCE | Admitting: Interventional Cardiology

## 2014-05-05 ENCOUNTER — Telehealth: Payer: Self-pay | Admitting: Interventional Cardiology

## 2014-05-05 NOTE — Telephone Encounter (Signed)
Patient is calling regarding upcoming testing.  He will be covered by Medicare effective May 26, 2014 and wants to know if it is ok to wait until after that date for these tests.  Otherwise, he must meet a large deductible.

## 2014-05-06 NOTE — Telephone Encounter (Signed)
Dr. Irish Lack, can pt wait to have test done?

## 2014-05-06 NOTE — Telephone Encounter (Signed)
Okay to wait until after July 1 as long as his symptoms are not getting worse.

## 2014-05-06 NOTE — Telephone Encounter (Signed)
Pt notified. Pt states he is traveling the whole month of July and he thinks it may not get done until August. If symptoms worsen he will call and have it done sooner.

## 2014-05-09 DIAGNOSIS — G4700 Insomnia, unspecified: Secondary | ICD-10-CM | POA: Insufficient documentation

## 2014-05-09 NOTE — Assessment & Plan Note (Signed)
Good compliance and control. Weight loss would help but no pressure change needed.

## 2014-05-09 NOTE — Assessment & Plan Note (Signed)
Still smoking 1 cigar a day and not inclined to stop

## 2014-05-09 NOTE — Assessment & Plan Note (Signed)
Plan-educated on good sleep hygiene. Plan-temazepam

## 2014-05-13 ENCOUNTER — Encounter (HOSPITAL_COMMUNITY): Payer: Managed Care, Other (non HMO)

## 2014-05-17 ENCOUNTER — Other Ambulatory Visit (HOSPITAL_COMMUNITY): Payer: Managed Care, Other (non HMO)

## 2014-06-23 ENCOUNTER — Ambulatory Visit (HOSPITAL_BASED_OUTPATIENT_CLINIC_OR_DEPARTMENT_OTHER): Payer: Medicare PPO | Admitting: Cardiology

## 2014-06-23 ENCOUNTER — Ambulatory Visit: Payer: Managed Care, Other (non HMO) | Admitting: Interventional Cardiology

## 2014-06-23 ENCOUNTER — Ambulatory Visit (HOSPITAL_COMMUNITY): Payer: Medicare PPO | Attending: Interventional Cardiology | Admitting: Radiology

## 2014-06-23 VITALS — BP 114/76 | Ht 71.0 in | Wt 328.0 lb

## 2014-06-23 DIAGNOSIS — R0602 Shortness of breath: Secondary | ICD-10-CM

## 2014-06-23 DIAGNOSIS — R079 Chest pain, unspecified: Secondary | ICD-10-CM | POA: Diagnosis present

## 2014-06-23 DIAGNOSIS — E785 Hyperlipidemia, unspecified: Secondary | ICD-10-CM | POA: Diagnosis not present

## 2014-06-23 DIAGNOSIS — R0789 Other chest pain: Secondary | ICD-10-CM

## 2014-06-23 DIAGNOSIS — F17200 Nicotine dependence, unspecified, uncomplicated: Secondary | ICD-10-CM | POA: Insufficient documentation

## 2014-06-23 DIAGNOSIS — R002 Palpitations: Secondary | ICD-10-CM | POA: Insufficient documentation

## 2014-06-23 DIAGNOSIS — E119 Type 2 diabetes mellitus without complications: Secondary | ICD-10-CM | POA: Insufficient documentation

## 2014-06-23 DIAGNOSIS — I359 Nonrheumatic aortic valve disorder, unspecified: Secondary | ICD-10-CM | POA: Insufficient documentation

## 2014-06-23 DIAGNOSIS — I059 Rheumatic mitral valve disease, unspecified: Secondary | ICD-10-CM

## 2014-06-23 DIAGNOSIS — R42 Dizziness and giddiness: Secondary | ICD-10-CM | POA: Diagnosis not present

## 2014-06-23 MED ORDER — TECHNETIUM TC 99M SESTAMIBI GENERIC - CARDIOLITE
33.0000 | Freq: Once | INTRAVENOUS | Status: AC | PRN
  Administered 2014-06-23: 33 via INTRAVENOUS

## 2014-06-23 MED ORDER — REGADENOSON 0.4 MG/5ML IV SOLN
0.4000 mg | Freq: Once | INTRAVENOUS | Status: AC
  Administered 2014-06-23: 0.4 mg via INTRAVENOUS

## 2014-06-23 NOTE — Progress Notes (Signed)
Echo performed. 

## 2014-06-23 NOTE — Progress Notes (Signed)
[facility] [address] [place], [place] [address] [phone]    Cardiology Nuclear Med Study  Gordon Madden is a 63 y.o. male     MRN : [id number]     DOB: 07/05/1951  Procedure Date: 06/23/2014  Nuclear Med Background Indication for Stress Test:  Evaluation for Ischemia History:  CATH-CAD-MI-Occluded RCA 2008 MPI: EF: 36% Ischemia Scar 2010 ECHO EF: 40-45% CM MR Cardiac Risk Factors: CVA, Hypertension, Lipids and NIDDM  Symptoms:  Chest Pain, Dizziness, Palpitations and SOB   Nuclear Pre-Procedure Caffeine/Decaff Intake:  None NPO After: 7:30am   Lungs:  clear O2 Sat: 95% on room air. IV 0.9% NS with Angio Cath:  22g  IV Site: R Hand  IV Started by:  Crissie Figures, RN  Chest Size (in):  50+ Cup Size: n/a  Height: 5\' 11"  (1.803 m)  Weight:  328 lb (148.78 kg)  BMI:  Body mass index is 45.77 kg/(m^2). Tech Comments:  N/A    Nuclear Med Study 1 or 2 day study: 2 day  Stress Test Type:  Lexiscan  Reading MD: N/A  Order Authorizing Provider:  Thamas Jaegers, MD  Resting Radionuclide: Technetium 6m Sestamibi  Resting Radionuclide Dose: 33.0 mCi   On     06-24-14  Stress Radionuclide:  Technetium 52m Sestamibi  Stress Radionuclide Dose: 33.0 mCi  On         06-23-14          Stress Protocol Rest HR: 67 Stress HR: 86  Rest BP: 114/76 Stress BP: 104/62  Exercise Time (min): n/a METS: n/a   Predicted Max HR: 155 bpm % Max HR: 55.48 bpm Rate Pressure Product: 9804   Dose of Adenosine (mg):  n/a Dose of Lexiscan: 0.4 mg  Dose of Atropine (mg): n/a Dose of Dobutamine: n/a mcg/kg/min (at max HR)  Stress Test Technologist: Perrin Maltese, EMT-P  Nuclear Technologist:  Vedia Pereyra, CNMT     Rest Procedure:  Myocardial perfusion imaging was performed at rest 45 minutes following the intravenous administration of Technetium 33m Sestamibi. Rest ECG: NSR with occasional PVCs, old anterolateral infarction  Stress Procedure:  The patient  received IV Lexiscan 0.4 mg over 15-seconds.  Technetium 71m Sestamibi injected at 30-seconds. This patient had sob with the Lexiscan injection. Quantitative spect images were obtained after a 45 minute delay. Stress ECG: No significant change from baseline ECG  QPS Raw Data Images:  Normal; no motion artifact; normal heart/lung ratio. Stress Images:  There is decreased uptake in the entire lateral wall. SSS 15 Rest Images:  Comparison with the stress images reveals no significant change. Subtraction (SDS):  There is a fixed defect that is most consistent with a previous infarction. Transient Ischemic Dilatation (Normal <1.22):  0.84 Lung/Heart Ratio (Normal <0.45):  0.31  Quantitative Gated Spect Images QGS EDV:  184 ml QGS ESV:  119 ml  Impression Exercise Capacity:  Lexiscan with no exercise. BP Response:  Normal blood pressure response. Clinical Symptoms:  No significant symptoms noted. ECG Impression:  No significant ECG changes with Lexiscan. Comparison with Prior Nuclear Study: No previous nuclear study performed  Overall Impression:  Low risk stress nuclear study with a large inferolateral scar and minimal periinfarct ischemia.  LV Ejection Fraction: 35%.  LV Wall Motion:  inferolateral akinesis/mild dyskinesis. Moderately depressed overall systolic function.  [name], MD, [facility] [phone] office [phone] pager

## 2014-06-24 ENCOUNTER — Ambulatory Visit (HOSPITAL_COMMUNITY): Payer: Medicare PPO

## 2014-06-24 DIAGNOSIS — R0989 Other specified symptoms and signs involving the circulatory and respiratory systems: Secondary | ICD-10-CM

## 2014-06-24 MED ORDER — TECHNETIUM TC 99M SESTAMIBI GENERIC - CARDIOLITE
30.0000 | Freq: Once | INTRAVENOUS | Status: AC | PRN
  Administered 2014-06-24: 30 via INTRAVENOUS

## 2015-01-04 ENCOUNTER — Ambulatory Visit (INDEPENDENT_AMBULATORY_CARE_PROVIDER_SITE_OTHER): Payer: Medicare PPO | Admitting: Interventional Cardiology

## 2015-01-04 ENCOUNTER — Encounter: Payer: Self-pay | Admitting: Interventional Cardiology

## 2015-01-04 VITALS — BP 140/98 | HR 73 | Ht 71.0 in | Wt 335.0 lb

## 2015-01-04 DIAGNOSIS — I10 Essential (primary) hypertension: Secondary | ICD-10-CM

## 2015-01-04 DIAGNOSIS — I2510 Atherosclerotic heart disease of native coronary artery without angina pectoris: Secondary | ICD-10-CM

## 2015-01-04 DIAGNOSIS — Z01810 Encounter for preprocedural cardiovascular examination: Secondary | ICD-10-CM

## 2015-01-04 DIAGNOSIS — E669 Obesity, unspecified: Secondary | ICD-10-CM

## 2015-01-04 DIAGNOSIS — I059 Rheumatic mitral valve disease, unspecified: Secondary | ICD-10-CM

## 2015-01-04 DIAGNOSIS — I252 Old myocardial infarction: Secondary | ICD-10-CM

## 2015-01-04 NOTE — Patient Instructions (Signed)
Your physician wants you to follow-up in: 8 months with Dr. Irish Lack. You will receive a reminder letter in the mail two months in advance. If you don't receive a letter, please call our office to schedule the follow-up appointment.

## 2015-01-04 NOTE — Progress Notes (Signed)
Patient ID: Gordon Madden, male   DOB: September 14, 1951, 64 y.o.   MRN: [id number] Patient ID: Gordon Madden, male   DOB: 1951/09/12, 64 y.o.   MRN: [id number]    [place] [place], [place]  [address] Phone: [phone] Fax:  [phone]  Date:  01/04/2015   ID:  Elchonon Maxson, DOB Oct 08, 1951, MRN [id number]  PCP:  Osborne Casco, MD      History of Present Illness: Doniven Vanpatten is a 64 y.o. male with CAD. He takes CoQ10 and restarted pravastatin. Chronic joint pains. 35 minute walk daily after recent cortisone shot, had previously stopped this recently do to knee pain. Started on metformin with increase in HbA1C. CAD/ASCVD:  Denies : Chest pain.  Dizziness.  Dyspnea on exertion.  Leg edema.  Nitroglycerin.  Palpitations resolved.  Syncope.    Had 2 episodes of right upper quadrant pain in 2015.  THought to be gall bladder attacks.  Avoiding fatty foods ECG was negative.  He had a stress test which did not show any new ischemia.   Went on to [employer].    Platelets have decrease to 84K.  This will be recheked by PMD.  He notes some SHOB when intially lying down and going to sleep.  He thinks it is a panic attack.  E uses CPAP machine forcing him to breath through his nose.  He has not had to use more pillows, unless he has a cold.  He wakes up easily and cannot fall back asleep.    No extra swelling in legs.     Wt Readings from Last 3 Encounters:  01/04/15 335 lb (151.955 kg)  06/23/14 328 lb (148.78 kg)  04/26/14 333 lb (151.048 kg)     Past Medical History  Diagnosis Date  . BPH (benign prostatic hyperplasia)   . CVA (cerebral infarction)     Renal artery branch occlusion  . Hypertension   . Myocardial infarction   . Cardiomyopathy     EF 40%  . Rhinosinusitis   . Diabetes mellitus, type 2   . Colon polyps   . Thrombocytopenia   . OSA (obstructive sleep apnea)     07/25/10 AHI 14.9/hr  . Stroke     Current Outpatient Prescriptions  Medication Sig  Dispense Refill  . aspirin 81 MG tablet Take 81 mg by mouth daily.      . carvedilol (COREG) 6.25 MG tablet Take 6.25 mg by mouth daily.      . clopidogrel (PLAVIX) 75 MG tablet Take 75 mg by mouth daily.      . Coenzyme Q10 (EQL COQ10) 300 MG CAPS Take 1 capsule by mouth daily.      . fexofenadine (ALLEGRA) 180 MG tablet Take 180 mg by mouth daily as needed.    . finasteride (PROSCAR) 5 MG tablet Take 5 mg by mouth daily.    . folic acid (FOLVITE) 725 MCG tablet Take 400 mcg by mouth daily.      [name] lisinopril (PRINIVIL,ZESTRIL) 10 MG tablet Take 10 mg by mouth daily.      . metFORMIN (GLUCOPHAGE) 500 MG tablet Take 500 mg by mouth daily with breakfast.      . NUVIGIL 150 MG tablet TAKE 1 TABLET BY MOUTH DAILY AS NEEDED 30 tablet 5  . Omega-3 Fatty Acids (FISH OIL) 1200 MG CAPS Take 2 capsules by mouth daily.      . pravastatin (PRAVACHOL) 40 MG tablet Take 40 mg by mouth daily.      [name]  Tamsulosin HCl (FLOMAX) 0.4 MG CAPS Take 0.4 mg by mouth daily.      . temazepam (RESTORIL) 15 MG capsule Take 1 capsule (15 mg total) by mouth at bedtime as needed for sleep. 30 capsule 2   No current facility-administered medications for this visit.    Allergies:    Allergies  Allergen Reactions  . Plavix [Clopidogrel]     thrombocytopenia    Social History:  The patient  reports that he has been smoking Cigars.  He has never used smokeless tobacco. He reports that he drinks alcohol. He reports that he does not use illicit drugs.   Family History:  The patient's family history includes Hypertension in his father. There is no history of Colon cancer.   ROS:  Please see the history of present illness.  No nausea, vomiting.  No fevers, chills.  No focal weakness.  No dysuria. Shortness of breath a little worse with lying flat.  All other systems reviewed and negative.   PHYSICAL EXAM: VS:  BP 140/98 mmHg  Pulse 73  Ht _0  (1.803 m)  Wt 335 lb (151.955 kg)  BMI 46.74 kg/m2  SpO2 98% Well  nourished, well developed, in no acute distress HEENT: normal Neck: no JVD, no carotid bruits Cardiac:  normal S1, S2; RRR;  Lungs:  clear to auscultation bilaterally, no wheezing, rhonchi or rales Abd: soft, nontender, no hepatomegaly Ext: no edema Skin: warm and dry Neuro:   no focal abnormalities noted Psych: normal affect     ASSESSMENT AND PLAN:  Coronary atherosclerosis of native coronary artery  IMAGING: EKG   Overton,Shana 07/24/2013 09:05:41 AM > Marbin Olshefski,JAY 07/24/2013 09:31:27 AM > NSR, PRWP   Notes: No angina.  Walks daily without symptoms. prior abnormal stress test several years ago in 2008.  Repeat study in 7/15 showed large inferolateral scar with minimal periinfarct reversibility. Known occluded RCA.  Given the recent symptoms of upper abdominal pain and the concern for heart disease, will repeat pharmacologic stress test. He'll stay on his anti-anginal therapy.  2. Hypertension  Notes: Controlled at the drug store (114/86).  Today's reading is unusual.  He has a cold and he has been taking some OTC cold meds, but not Sudafed ( has coricidan).  Should improve also with lifestyle modifications.  3. Obesity, unspecified  Notes: Maintaining weight loss for the most part. COntinue to try to lose weight with regular exercise and diet control. He'll try to get under 300 pounds over the next 7-8 months.   Has not been able to lose weight in the last 6 months or so.  4. Hypercholesteremia, pure  Continue Pravastatin Sodium Tablet, 40 MG, 1 tablet, once a day Continue CoQ-10 Capsule, 100 MG, 1 capsule with a meal, Orally, Once a day Notes: LDL 75 at last check in 2013; LDL 78 in 2014. LDL target < 100.   5. Tobacco dependence  Notes: he  smoked cigars for a long time. Have advised him to stop for a long time.  He finally stopped.  6. Mitral Regurgitation  Notes: mitral regurgitation noted on prior echocardiogram. May be related to the inferior ischemia/infarct that he had  noted on his prior stress test. Now that he has had some shortness of breath with lying flat. Will check BNP and be met. We'll also check echocardiogram to see if mitral regurgitation has gotten any worse.  7. Preoperative evaluation: planning TKR.  No further cardiac testing needed at this time.  Could stop plavix one  week beore surgery if needed.  Cc: Dr. Mardelle Matte.   Signed, Mina Marble, MD, [facility] 01/04/2015 9:40 AM

## 2015-04-01 ENCOUNTER — Ambulatory Visit: Payer: Commercial Indemnity | Admitting: Internal Medicine

## 2015-04-14 ENCOUNTER — Other Ambulatory Visit: Payer: Self-pay | Admitting: Orthopedic Surgery

## 2015-04-15 ENCOUNTER — Telehealth: Payer: Self-pay | Admitting: Internal Medicine

## 2015-04-15 NOTE — Telephone Encounter (Signed)
new patient appt-s/w patient and gave np appt for 06/13 @ 11 w/Dr. Julien Nordmann Referring Dr. Kelton Pillar Dx- H/o Thrombocytopenia, needs clearance for upcoming surgery

## 2015-04-20 ENCOUNTER — Ambulatory Visit: Payer: Medicare PPO | Admitting: Internal Medicine

## 2015-05-04 ENCOUNTER — Ambulatory Visit (INDEPENDENT_AMBULATORY_CARE_PROVIDER_SITE_OTHER): Payer: Medicare PPO | Admitting: Internal Medicine

## 2015-05-04 ENCOUNTER — Encounter: Payer: Self-pay | Admitting: Internal Medicine

## 2015-05-04 VITALS — BP 102/60 | HR 63 | Ht 72.0 in | Wt 334.0 lb

## 2015-05-04 DIAGNOSIS — G4733 Obstructive sleep apnea (adult) (pediatric): Secondary | ICD-10-CM | POA: Diagnosis not present

## 2015-05-04 DIAGNOSIS — Z720 Tobacco use: Secondary | ICD-10-CM | POA: Diagnosis not present

## 2015-05-04 DIAGNOSIS — G4700 Insomnia, unspecified: Secondary | ICD-10-CM

## 2015-05-04 DIAGNOSIS — F17200 Nicotine dependence, unspecified, uncomplicated: Secondary | ICD-10-CM

## 2015-05-04 NOTE — Progress Notes (Signed)
Patient ID: Gordon Madden, male    DOB: 1951/04/18, 64 y.o.   MRN: [id number]  HPI 08/01/11 37 yoM smoker followed for hypersomnia with sleep apnea, rhinitis, complicated by DM, HBP, CAD/MI, CVA/stroke    Dr Kelton Pillar PCP Last here 12/14/2010 OSA -CPAP 10 Advanced-using all night every night. New visual has helped for breakthrough daytime sleepiness. Scheduled for sinus surgery a week from today with Dr Simeon Craft ENT. Sinus pain began two months ago. CT positive after several antibiotics. Dx'd sinusitis with septal deviation. He had concerns about use of CPAP after the surgery and we discussed - he will probably have to skip CPAP briefly. He felt comfortably leaving off CPAP for a few days while travelling, otherwise he has used it regularly.  Denies chest tightness, cough or increased shortness of breath. Newly dx'd DMII by his PCP. He is walking 40 minutes daily.  Still smoking 1 cigar daily- not inhaling. Quit his 3ppd cigarette habit 20 years ago.   12/05/11- 60 yoM smoker followed for hypersomnia with sleep apnea, rhinitis, complicated by DM, HBP, CAD/MI, CVA/stroke    Dr Kelton Pillar PCP He had sinus surgery by Dr. Simeon Craft. 5 says he is not snoring without his CPAP but he still feels better, sleeps better, wearing CPAP. No sinus infections or colds since the surgery and otherwise doing well. He does admit he has gained 8 pounds with the holidays. CPAP pressure is set at 10 and has used all night every night.  03/31/12-  46 yoM  Cigar smoker followed for hypersomnia with sleep apnea, rhinitis, complicated by DM, HBP, CAD/MI, CVA/stroke     Dr Kelton Pillar PCP  Uses CPAP every night for approx 7 hours; pressure working well for [facility] is DME company. 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 Taking Allegra for seasonal rhinitis.  03/31/13- 65 yoM Cigar smoker followed for hypersomnia with sleep apnea, rhinitis, complicated by DM, HBP, CAD/MI, CVA/stroke   FOLLOWS FOR: wears CPAP 10/ Advanced every night for about 7 hours and pressure working well for patient;    We discussed usual guidelines for replacement of machine and mask. Rare use Nuvigil, and usually then will take part of a pill. Little pollen allergy this spring.   03/31/14- 44 yoM Cigar smoker followed for hypersomnia with sleep apnea, rhinitis, complicated by DM, HBP, CAD/MI, CVA/stroke  FOLLOWS FOR:  Wearing CPAP 10/ Advanced 6-7 hours per night.  Pressure doing well New pain right anterolateral chest for the past 4 or 5 weeks, radiating around to the back on right side and scored 5/10 "like indigestion" with nausea. Constant aching for the last several hours finally resolve spontaneously. Primary physician questions gallbladder. Breathing has been okay. Pain is not pleuritic it is not hurting now. Insomnia-asks about getting sleep medicine for "busy brain"-discussed.  05/04/15- 59 yoM Cigar smoker followed for hypersomnia with sleep apnea, insomnia, rhinitis, complicated by DM, HBP, CAD/MI, CVA/stroke  Reports: Pt. wears CPAP 10/ Advanced for about 6-7 hours nightly, no complaints about mask and tubing, pt. feels fairly well Still smokes a cigar a day and says he gained weight when he tried to stop. Pending left TKR Insomnia-rare need for temazepam but helpful when used.  Review of Systems-see HPI Constitutional:   No-   weight loss, night sweats, fevers, chills, fatigue, lassitude. HEENT:   No-  headaches, difficulty swallowing, tooth/dental problems, sore throat,      No-sneezing, itching, ear ache, nasal congestion, post nasal drip,  CV:  +  chest pain, orthopnea, PND, swelling in lower extremities, anasarca, dizziness, palpitations Resp: No- acute  shortness of breath with exertion or at rest.              No-   productive cough,  No non-productive cough,  No-  coughing up of blood.              No-   change in color of mucus.  No- wheezing.   Skin: No-   rash or lesions. GI:  No-    heartburn, indigestion, abdominal pain, +nausea,  GU:  MS:  +joint pain or swelling. . Neuro- nothing unusual  Psych:  No- change in mood or affect. No depression or anxiety.  No memory loss.  Objective:   Physical Exam General- Alert, Oriented, Affect-appropriate, Distress- none acute;  Quite obese Skin- cool, clammy Lymphadenopathy- none Head- atraumatic            Eyes- Gross vision intact, PERRLA, conjunctivae clear secretions            Ears- Hearing, canals normal            Nose- Clear, Septal dev- not apparent from anterior., mucus, polyps, erosion, perforation             Throat- Mallampati III , mucosa clear , drainage- none, tonsils- atrophic Neck- flexible , trachea midline, no stridor , thyroid nl, carotid no bruit Chest - symmetrical excursion , unlabored           Heart/CV- RRR , no murmur , no gallop  , no rub, nl s1 s2                           - JVD- none , edema- none, stasis changes+, varices- none           Lung- clear to P&A, wheeze- none, cough- none , dullness-none, rub- none           Chest wall-  Abd-  Br/ Gen/ Rectal- Not done, not indicated Extrem- cyanosis- none, clubbing, none, atrophy- none, strength- nl Neuro- grossly intact to observation

## 2015-05-04 NOTE — Patient Instructions (Signed)
Ok to continue CPAP 10/ Advanced  Order- DME Advanced download for pressure compliance  Dx OSA  Ok to take an occasional temazepam for insomnia if needed

## 2015-05-09 ENCOUNTER — Other Ambulatory Visit (HOSPITAL_BASED_OUTPATIENT_CLINIC_OR_DEPARTMENT_OTHER): Payer: Medicare PPO

## 2015-05-09 ENCOUNTER — Encounter: Payer: Self-pay | Admitting: Internal Medicine

## 2015-05-09 ENCOUNTER — Ambulatory Visit: Payer: Medicare PPO

## 2015-05-09 ENCOUNTER — Other Ambulatory Visit: Payer: Self-pay | Admitting: Internal Medicine

## 2015-05-09 ENCOUNTER — Ambulatory Visit (HOSPITAL_BASED_OUTPATIENT_CLINIC_OR_DEPARTMENT_OTHER): Payer: Medicare PPO | Admitting: Internal Medicine

## 2015-05-09 VITALS — BP 133/78 | HR 67 | Temp 98.0°F | Resp 20 | Ht 72.0 in | Wt 333.1 lb

## 2015-05-09 DIAGNOSIS — D696 Thrombocytopenia, unspecified: Secondary | ICD-10-CM

## 2015-05-09 DIAGNOSIS — D693 Immune thrombocytopenic purpura: Secondary | ICD-10-CM | POA: Diagnosis not present

## 2015-05-09 LAB — CBC WITH DIFFERENTIAL/PLATELET
BASO%: 0.8 % (ref 0.0–2.0)
BASOS ABS: 0 10*3/uL (ref 0.0–0.1)
EOS ABS: 0.5 10*3/uL (ref 0.0–0.5)
EOS%: 8.4 % — ABNORMAL HIGH (ref 0.0–7.0)
HCT: 47.3 % (ref 38.4–49.9)
HEMOGLOBIN: 16.3 g/dL (ref 13.0–17.1)
LYMPH%: 17.9 % (ref 14.0–49.0)
MCH: 32.8 pg (ref 27.2–33.4)
MCHC: 34.4 g/dL (ref 32.0–36.0)
MCV: 95.3 fL (ref 79.3–98.0)
MONO#: 0.5 10*3/uL (ref 0.1–0.9)
MONO%: 8.7 % (ref 0.0–14.0)
NEUT%: 64.2 % (ref 39.0–75.0)
NEUTROS ABS: 3.8 10*3/uL (ref 1.5–6.5)
RBC: 4.96 10*6/uL (ref 4.20–5.82)
RDW: 13.2 % (ref 11.0–14.6)
WBC: 5.9 10*3/uL (ref 4.0–10.3)
lymph#: 1.1 10*3/uL (ref 0.9–3.3)

## 2015-05-09 LAB — COMPREHENSIVE METABOLIC PANEL (CC13)
ALT: 17 U/L (ref 0–55)
ANION GAP: 9 meq/L (ref 3–11)
AST: 16 U/L (ref 5–34)
Albumin: 3.7 g/dL (ref 3.5–5.0)
Alkaline Phosphatase: 46 U/L (ref 40–150)
BUN: 15.2 mg/dL (ref 7.0–26.0)
CO2: 22 mEq/L (ref 22–29)
Calcium: 8.7 mg/dL (ref 8.4–10.4)
Chloride: 106 mEq/L (ref 98–109)
Creatinine: 0.8 mg/dL (ref 0.7–1.3)
Glucose: 180 mg/dl — ABNORMAL HIGH (ref 70–140)
POTASSIUM: 4.7 meq/L (ref 3.5–5.1)
SODIUM: 137 meq/L (ref 136–145)
TOTAL PROTEIN: 6.1 g/dL — AB (ref 6.4–8.3)
Total Bilirubin: 0.82 mg/dL (ref 0.20–1.20)

## 2015-05-09 LAB — LACTATE DEHYDROGENASE (CC13): LDH: 171 U/L (ref 125–245)

## 2015-05-09 NOTE — Progress Notes (Signed)
[place] [phone]   [phone]  CONSULT NOTE  REFERRING PHYSICIAN: Dr. Kelton Pillar  REASON FOR CONSULTATION:  64 years old white male with chronic thrombocytopenia.  HPI Gordon Madden is a 64 y.o. male with past medical history significant for coronary heart disease status post myocardial infarction, cardiomyopathy, diabetes mellitus, hypertension, colon polyps and chronic thrombocytopenia. The patient is is scheduled for left knee replacement on 06/07/2015 and he needs clearance regarding his thrombocytopenia before the surgery. 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 His platelets count has been ranging between 80,000-115,000. The patient denied having any significant bleeding from his thrombocytopenia except for rare occasional epistaxis. He also has a little bit of slightly prolonged bleeding after dental extraction. He is currently on several medications that also can contribute to thrombocytopenia including Pravachol for the last 10-15 years. He is also on Plavix and aspirin. He denied having any significant bleeding, bruises or ecchymosis. He has no significant complaints today. He denied having any significant weight loss or night sweats. The patient denied having any chest pain, shortness of breath, cough or hemoptysis. He has no nausea or vomiting, no fever or chills. Family history significant for mother was osteoarthritis and father with benign prostatic hypertrophy both of them is still alive. No blood disorders in his family. The patient is married and has one son. He is currently retired and used to do office work. He has a history of smoking 2 packs per day for several years and currently smokes cigars. He also has 3 alcoholic drinks every weeks. No history of drug abuse.  HPI  Past Medical History  Diagnosis Date  . BPH (benign prostatic hyperplasia)   . CVA (cerebral infarction)    Renal artery branch occlusion  . Hypertension   . Myocardial infarction   . Cardiomyopathy     EF 40%  . Rhinosinusitis   . Diabetes mellitus, type 2   . Colon polyps   . Thrombocytopenia   . OSA (obstructive sleep apnea)     07/25/10 AHI 14.9/hr  . Stroke     Past Surgical History  Procedure Laterality Date  . Knee cartilage surgery      left  . Umbilical hernia repair    . Sinus irrigation      Family History  Problem Relation Age of Onset  . Colon cancer Neg Hx   . Hypertension Father     Social History History  Substance Use Topics  . Smoking status: Current Every Day Smoker    Types: Cigars  . Smokeless tobacco: Never Used     Comment: 1 cigar a day  . Alcohol Use: Yes     Comment: 5 drinks a week    Allergies  Allergen Reactions  . Plavix [Clopidogrel]     thrombocytopenia    Current Outpatient Prescriptions  Medication Sig Dispense Refill  . aspirin 81 MG tablet Take 81 mg by mouth daily.      . carvedilol (COREG) 6.25 MG tablet Take 6.25 mg by mouth daily.      . clopidogrel (PLAVIX) 75 MG tablet Take 75 mg by mouth daily.      . Coenzyme Q10 (EQL COQ10) 300 MG CAPS Take 1 capsule by mouth daily.      . fexofenadine (ALLEGRA) 180 MG tablet Take 180 mg by mouth daily as needed.    . finasteride (PROSCAR) 5 MG tablet Take 5 mg by mouth daily.    [name]  folic acid (FOLVITE) 789 MCG tablet Take 400 mcg by mouth daily.      [name] lisinopril (PRINIVIL,ZESTRIL) 10 MG tablet Take 10 mg by mouth daily.      . metFORMIN (GLUCOPHAGE) 500 MG tablet Take 500 mg by mouth daily with breakfast.      . pravastatin (PRAVACHOL) 40 MG tablet Take 40 mg by mouth daily.      . Tamsulosin HCl (FLOMAX) 0.4 MG CAPS Take 0.4 mg by mouth daily.      . Omega-3 Fatty Acids (FISH OIL) 1200 MG CAPS Take 2 capsules by mouth daily.      . temazepam (RESTORIL) 15 MG capsule Take 1 capsule (15 mg total) by mouth at bedtime as needed for sleep. (Patient not taking: Reported on 05/09/2015) 30  capsule 2   No current facility-administered medications for this visit.    Review of Systems  Constitutional: negative Eyes: negative Ears, nose, mouth, throat, and face: negative Respiratory: negative Cardiovascular: negative Gastrointestinal: negative Genitourinary:negative Integument/breast: negative Hematologic/lymphatic: negative Musculoskeletal:negative Neurological: negative Behavioral/Psych: negative Endocrine: negative Allergic/Immunologic: negative  Physical Exam  FYB:OFBPZ, healthy, no distress, well nourished and well developed SKIN: skin color, texture, turgor are normal, no rashes or significant lesions HEAD: Normocephalic, No masses, lesions, tenderness or abnormalities EYES: normal, PERRLA EARS: External ears normal, Canals clear OROPHARYNX:no exudate, no erythema and lips, buccal mucosa, and tongue normal  NECK: supple, no adenopathy, no JVD LYMPH:  no palpable lymphadenopathy, no hepatosplenomegaly LUNGS: clear to auscultation , and palpation HEART: regular rate & rhythm, no murmurs and no gallops ABDOMEN:abdomen soft, non-tender, obese, normal bowel sounds and no masses or organomegaly BACK: Back symmetric, no curvature., No CVA tenderness EXTREMITIES:no joint deformities, effusion, or inflammation, no edema, no skin discoloration  NEURO: alert & oriented x 3 with fluent speech, no focal motor/sensory deficits  PERFORMANCE STATUS: ECOG 1  LABORATORY DATA: Lab Results  Component Value Date   WBC 5.9 05/09/2015   HGB 16.3 05/09/2015   HCT 47.3 05/09/2015   MCV 95.3 05/09/2015   PLT 96 Large platelets present* 05/09/2015      Chemistry      Component Value Date/Time   NA 137 05/09/2015 1111   NA 137 04/26/2014 1101   K 4.7 05/09/2015 1111   K 4.7 04/26/2014 1101   CL 102 04/26/2014 1101   CO2 22 05/09/2015 1111   CO2 28 04/26/2014 1101   BUN 15.2 05/09/2015 1111   BUN 21 04/26/2014 1101   CREATININE 0.8 05/09/2015 1111   CREATININE 0.9  04/26/2014 1101      Component Value Date/Time   CALCIUM 8.7 05/09/2015 1111   CALCIUM 8.9 04/26/2014 1101   ALKPHOS 46 05/09/2015 1111   AST 16 05/09/2015 1111   ALT 17 05/09/2015 1111   BILITOT 0.82 05/09/2015 1111       RADIOGRAPHIC STUDIES: No results found.  ASSESSMENT: This is a very pleasant 64 years old white male with chronic idiopathic thrombocytopenic purpura for several years and has been asymptomatic from his thrombocytopenia. His platelets count today are 96,000 with a lot of large platelets which usually function better than small platelets.   PLAN: I had a lengthy discussion with the patient today about his condition. I do think the patient will have any major issues if he proceeds with the left knee surgery was his current platelets count 96,000 or any platelets count to over 70,000. He definitely will need to discontinue Plavix and aspirin before his surgery. The patient will also  benefit from anticoagulation for 3-4 weeks after his knee surgery for DVT prophylaxis. If for any reason his platelets count are less than 70,000, I may consider the patient for treatment with stairs or IVIG to improve his platelets count before his surgery. I didn't see a need to see the patient at regular basis but I'll be happy to see him in the future if he has platelets count less than 30,000 or significant bleeding issues.  The patient was advised to call immediately if he has any concerning symptoms in the interval.  The patient voices understanding of current disease status and treatment options and is in agreement with the current care plan.  All questions were answered. The patient knows to call the clinic with any problems, questions or concerns. We can certainly see the patient much sooner if necessary.  Thank you so much for allowing me to participate in the care of [facility]. I will continue to follow up the patient with you and assist in his care.  I spent 40 minutes  counseling the patient face to face. The total time spent in the appointment was 60 minutes.  Disclaimer: This note was dictated with voice recognition software. Similar sounding words can inadvertently be transcribed and may not be corrected upon review.   Gordon Madden K. May 09, 2015, 12:11 PM

## 2015-05-09 NOTE — Progress Notes (Signed)
Checked in new pt with no financial concerns. °

## 2015-05-16 ENCOUNTER — Encounter: Payer: Self-pay | Admitting: Physician Assistant

## 2015-05-16 ENCOUNTER — Ambulatory Visit (INDEPENDENT_AMBULATORY_CARE_PROVIDER_SITE_OTHER): Payer: Medicare PPO | Admitting: Physician Assistant

## 2015-05-16 VITALS — BP 138/82 | HR 80 | Ht 72.0 in | Wt 334.1 lb

## 2015-05-16 DIAGNOSIS — Z720 Tobacco use: Secondary | ICD-10-CM | POA: Diagnosis not present

## 2015-05-16 DIAGNOSIS — I10 Essential (primary) hypertension: Secondary | ICD-10-CM | POA: Diagnosis not present

## 2015-05-16 DIAGNOSIS — F17200 Nicotine dependence, unspecified, uncomplicated: Secondary | ICD-10-CM

## 2015-05-16 DIAGNOSIS — Z01818 Encounter for other preprocedural examination: Secondary | ICD-10-CM | POA: Diagnosis not present

## 2015-05-16 DIAGNOSIS — E669 Obesity, unspecified: Secondary | ICD-10-CM | POA: Diagnosis not present

## 2015-05-16 DIAGNOSIS — I2510 Atherosclerotic heart disease of native coronary artery without angina pectoris: Secondary | ICD-10-CM

## 2015-05-16 NOTE — Assessment & Plan Note (Signed)
Weight loss program recommended

## 2015-05-16 NOTE — Assessment & Plan Note (Signed)
Smoking cessation discussed with patient. 

## 2015-05-16 NOTE — Progress Notes (Signed)
Cardiology Office Note   Date:  05/16/2015   ID:  Gordon Madden, DOB Dec 12, 1950, MRN [id number]  PCP:  Osborne Casco, MD  Cardiologist:  Dr. Irish Lack Chief Complaint: preoperative clearance    History of Present Illness: Gordon Madden is a 64 y.o. male who presents for preoperative clearance before undergoing total left knee replacement in July.  Patient has a history of coronary artery disease status post prior MI in 2004 and was found to have a totally occluded RCA at [facility] in [place]. We do not have these records. Last stress Myoview 06/25/14 was low risk nuclear study with large inferolateral scar and minimal peri-infarct ischemia. EF 35% with inferolateral akinesis/mild dyskinesis. Moderately depressed overall systolic function. 2-D echo 06/23/14 EF was 40-45% with no regional wall motion abnormalities.  Patient was actually seen by Dr. Irish Lack 12/2014 at which time he was cleared for total knee replacement. The patient didn't have it done right away and is back for clearance. He denies any change in his symptoms. He has chronic dyspnea on exertion that has not changed. He was walking 1-1/2 miles daily but can only do this 3 days a week because of his knee pain. He denies any chest pain, tightness, pressure, dizziness or presyncope. He continues to smoke one cigar daily. He quit back in January for 2 months when the weather got cold but he gained 12 pounds. He also has hypercholesterolemia treated with pravastatin and CoQ10, hypertension and obesity. 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 Cleared by Dr. Earlie Server for surgery.   Past Medical History  Diagnosis Date  . BPH (benign prostatic hyperplasia)   . CVA (cerebral infarction)     Renal artery branch occlusion  . Hypertension   . Myocardial infarction   . Cardiomyopathy     EF 40%  . Rhinosinusitis   . Diabetes mellitus, type 2   . Colon polyps   . Thrombocytopenia   . OSA (obstructive  sleep apnea)     07/25/10 AHI 14.9/hr  . Stroke     Past Surgical History  Procedure Laterality Date  . Knee cartilage surgery      left  . Umbilical hernia repair    . Sinus irrigation       Current Outpatient Prescriptions  Medication Sig Dispense Refill  . aspirin 81 MG tablet Take 81 mg by mouth daily.      . carvedilol (COREG) 6.25 MG tablet Take 6.25 mg by mouth daily.      . clopidogrel (PLAVIX) 75 MG tablet Take 75 mg by mouth daily.      . Coenzyme Q10 (EQL COQ10) 300 MG CAPS Take 1 capsule by mouth daily.      . fexofenadine (ALLEGRA) 180 MG tablet Take 180 mg by mouth daily as needed.    . finasteride (PROSCAR) 5 MG tablet Take 5 mg by mouth daily.    . folic acid (FOLVITE) 233 MCG tablet Take 400 mcg by mouth daily.      [name] lisinopril (PRINIVIL,ZESTRIL) 10 MG tablet Take 10 mg by mouth daily.      . metFORMIN (GLUCOPHAGE) 500 MG tablet Take 500 mg by mouth daily with breakfast.      . pravastatin (PRAVACHOL) 40 MG tablet Take 40 mg by mouth daily.      . Tamsulosin HCl (FLOMAX) 0.4 MG CAPS Take 0.4 mg by mouth daily.      . temazepam (RESTORIL) 15 MG capsule Take 1 capsule (15  mg total) by mouth at bedtime as needed for sleep. 30 capsule 2  . Omega-3 Fatty Acids (FISH OIL) 1200 MG CAPS Take 2 capsules by mouth daily.       No current facility-administered medications for this visit.    Allergies:   Plavix    Social History:  The patient  reports that he has been smoking Cigars.  He has never used smokeless tobacco. He reports that he drinks alcohol. He reports that he does not use illicit drugs.   Family History:  The patient's   family history includes Hypertension in his father. There is no history of Colon cancer.    ROS:  Please see the history of present illness.   Otherwise, review of systems are positive for chronic left knee pain, some swelling.   All other systems are reviewed and negative.    PHYSICAL EXAM: VS:  BP 138/82 mmHg  Pulse 80  Ht 6' (1.829  m)  Wt 334 lb 1.9 oz (151.556 kg)  BMI 45.30 kg/m2 , BMI Body mass index is 45.3 kg/(m^2). GEN: Obese, in no acute distress Neck: no JVD, HJR, carotid bruits, or masses Cardiac:  RRR; no murmurs,gallop, rubs, thrill or heave,  Respiratory:  clear to auscultation bilaterally, normal work of breathing GI: soft, nontender, nondistended, + BS MS: no deformity or atrophy Extremities: trace of edema without cyanosis, clubbing, good distal pulses bilaterally.  Skin: warm and dry, no rash Neuro:  Strength and sensation are intact    EKG:  EKG is ordered today. The ekg ordered today demonstrates normal sinus rhythm at 63 bpm with PVC nonspecific ST-T wave changes   Recent Labs: 05/09/2015: ALT 17; BUN 15.2; Creatinine 0.8; HGB 16.3; Platelets 96 Large platelets present*; Potassium 4.7; Sodium 137    Lipid Panel No results found for: CHOL, TRIG, HDL, CHOLHDL, VLDL, LDLCALC, LDLDIRECT    Wt Readings from Last 3 Encounters:  05/16/15 334 lb 1.9 oz (151.556 kg)  05/09/15 333 lb 1.6 oz (151.093 kg)  05/04/15 334 lb (151.501 kg)      Other studies Reviewed: Additional studies/ records that were reviewed today include and review of the records demonstrates:  Study Conclusions  - Left ventricle: Limited quality images but there appears to be   basal and mid inferior and inferolateral hypokinesis. The cavity   size was moderately dilated. There was mild focal basal   hypertrophy of the septum. Systolic function was mildly to   moderately reduced. The estimated ejection fraction was in the   range of 40% to 45%. Wall motion was normal; there were no   regional wall motion abnormalities. - Aortic valve: There was mild regurgitation. - Aortic root: The aortic root was normal in size. - Ascending aorta: The ascending aorta was mildly dilated measuring   42 mm. - Mitral valve: Mildly thickened leaflets . There was no   regurgitation. - Left atrium: The atrium was moderately dilated. -  Right ventricle: Systolic function was normal. - Tricuspid valve: There was mild regurgitation. - Pulmonic valve: There was no regurgitation. - Pulmonary arteries: Systolic pressure was within the normal   range.  Impressions:  - Mildly to moderately impaired systolic function. Diastolic   function and trasmitral velocities were not obtained.   Limited quality images but there appears to be basal and mid   inferior and inferolateral hypokinesis.   Thickened and calcified aortic valve with only minimally elevated   transaortic velocities.   Moderately dilated left atrium.  Overall Impression:  Low risk stress nuclear study with a large inferolateral scar and minimal periinfarct ischemia.  LV Ejection Fraction: 35%.  LV Wall Motion:  inferolateral akinesis/mild dyskinesis. Moderately depressed overall systolic function.  [name], MD, [facility]   ASSESSMENT AND PLAN:  Preoperative clearance A she's here for preoperative cardiac clearance before undergoing total knee replacement in July. He was actually cleared by Dr.Varansi back in February but didn't have it done. Patient has had no change in his symptoms. He has chronic dyspnea on exertion most likely due to his ischemic cardiomyopathy. This has not changed. He has no chest pain. He had a low risk Lexi scan Myoview in 05/2014. EF estimated at 35% on Lexi scan, 40-45% on 2-D echo. Patient can walk 1 1/2 miles 3 days a week without difficulty. Can proceed with surgery without any further cardiac workup.  Tobacco use disorder Smoking cessation discussed with patient.  Essential hypertension Blood pressure stable. Trace of edema. Recommend 2 g sodium diet.  Obesity Weight loss program recommended  Coronary atherosclerosis of native coronary artery Patient has a known occluded RCA and cath in [place] in 2004. Stress Myoview in 05/2014 was low risk. See above note for details.    Sumner Boast, PA-C   05/16/2015 11:55 AM    [facility] [address], [place], [place]  [address] Phone: [phone]; Fax: [phone]

## 2015-05-16 NOTE — Assessment & Plan Note (Signed)
A she's here for preoperative cardiac clearance before undergoing total knee replacement in July. He was actually cleared by Dr.Varansi back in February but didn't have it done. Patient has had no change in his symptoms. He has chronic dyspnea on exertion most likely due to his ischemic cardiomyopathy. This has not changed. He has no chest pain. He had a low risk Lexi scan Myoview in 05/2014. EF estimated at 35% on Lexi scan, 40-45% on 2-D echo. Patient can walk 1 1/2 miles 3 days a week without difficulty. Can proceed with surgery without any further cardiac workup.

## 2015-05-16 NOTE — Patient Instructions (Signed)
Medication Instructions:   Your physician recommends that you continue on your current medications as directed. Please refer to the Current Medication list given to you today.   Labwork:   Testing/Procedures:   Follow-Up:  WITH DR Irish Lack 3 TO 4 MONTHS    Any Other Special Instructions Will Be Listed Below (If Applicable).  Smoking Cessation Quitting smoking is important to your health and has many advantages. However, it is not always easy to quit since nicotine is a very addictive drug. Oftentimes, people try 3 times or more before being able to quit. This document explains the best ways for you to prepare to quit smoking. Quitting takes hard work and a lot of effort, but you can do it. ADVANTAGES OF QUITTING SMOKING  You will live longer, feel better, and live better.  Your body will feel the impact of quitting smoking almost immediately.  Within 20 minutes, blood pressure decreases. Your pulse returns to its normal level.  After 8 hours, carbon monoxide levels in the blood return to normal. Your oxygen level increases.  After 24 hours, the chance of having a heart attack starts to decrease. Your breath, hair, and body stop smelling like smoke.  After 48 hours, damaged nerve endings begin to recover. Your sense of taste and smell improve.  After 72 hours, the body is virtually free of nicotine. Your bronchial tubes relax and breathing becomes easier.  After 2 to 12 weeks, lungs can hold more air. Exercise becomes easier and circulation improves.  The risk of having a heart attack, stroke, cancer, or lung disease is greatly reduced.  After 1 year, the risk of coronary heart disease is cut in half.  After 5 years, the risk of stroke falls to the same as a nonsmoker.  After 10 years, the risk of lung cancer is cut in half and the risk of other cancers decreases significantly.  After 15 years, the risk of coronary heart disease drops, usually to the level of a  nonsmoker.  If you are pregnant, quitting smoking will improve your chances of having a healthy baby.  The people you live with, especially any children, will be healthier.  You will have extra money to spend on things other than cigarettes. QUESTIONS TO THINK ABOUT BEFORE ATTEMPTING TO QUIT You may want to talk about your answers with your health care provider.  Why do you want to quit?  If you tried to quit in the past, what helped and what did not?  What will be the most difficult situations for you after you quit? How will you plan to handle them?  Who can help you through the tough times? Your family? Friends? A health care provider?  What pleasures do you get from smoking? What ways can you still get pleasure if you quit? Here are some questions to ask your health care provider:  How can you help me to be successful at quitting?  What medicine do you think would be best for me and how should I take it?  What should I do if I need more help?  What is smoking withdrawal like? How can I get information on withdrawal? GET READY  Set a quit date.  Change your environment by getting rid of all cigarettes, ashtrays, matches, and lighters in your home, car, or work. Do not let people smoke in your home.  Review your past attempts to quit. Think about what worked and what did not. GET SUPPORT AND ENCOURAGEMENT You have a better  chance of being successful if you have help. You can get support in many ways.  Tell your family, friends, and coworkers that you are going to quit and need their support. Ask them not to smoke around you.  Get individual, group, or telephone counseling and support. Programs are available at [employer] and health centers. Call your local health department for information about programs in your area.  Spiritual beliefs and practices may help some smokers quit.  Download a "quit meter" on your computer to keep track of quit statistics, such as how  long you have gone without smoking, cigarettes not smoked, and money saved.  Get a self-help book about quitting smoking and staying off tobacco. [place] yourself from urges to smoke. Talk to someone, go for a walk, or occupy your time with a task.  Change your normal routine. Take a different route to work. Drink tea instead of coffee. Eat breakfast in a different place.  Reduce your stress. Take a hot bath, exercise, or read a book.  Plan something enjoyable to do every day. Reward yourself for not smoking.  Explore interactive web-based programs that specialize in helping you quit. GET MEDICINE AND USE IT CORRECTLY Medicines can help you stop smoking and decrease the urge to smoke. Combining medicine with the above behavioral methods and support can greatly increase your chances of successfully quitting smoking.  Nicotine replacement therapy helps deliver nicotine to your body without the negative effects and risks of smoking. Nicotine replacement therapy includes nicotine gum, lozenges, inhalers, nasal sprays, and skin patches. Some may be available over-the-counter and others require a prescription.  Antidepressant medicine helps people abstain from smoking, but how this works is unknown. This medicine is available by prescription.  Nicotinic receptor partial agonist medicine simulates the effect of nicotine in your brain. This medicine is available by prescription. Ask your health care provider for advice about which medicines to use and how to use them based on your health history. Your health care provider will tell you what side effects to look out for if you choose to be on a medicine or therapy. Carefully read the information on the package. Do not use any other product containing nicotine while using a nicotine replacement product.  RELAPSE OR DIFFICULT SITUATIONS Most relapses occur within the first 3 months after quitting. Do not be discouraged  if you start smoking again. Remember, most people try several times before finally quitting. You may have symptoms of withdrawal because your body is used to nicotine. You may crave cigarettes, be irritable, feel very hungry, cough often, get headaches, or have difficulty concentrating. The withdrawal symptoms are only temporary. They are strongest when you first quit, but they will go away within 10-14 days. To reduce the chances of relapse, try to:  Avoid drinking alcohol. Drinking lowers your chances of successfully quitting.  Reduce the amount of caffeine you consume. Once you quit smoking, the amount of caffeine in your body increases and can give you symptoms, such as a rapid heartbeat, sweating, and anxiety.  Avoid smokers because they can make you want to smoke.  Do not let weight gain distract you. Many smokers will gain weight when they quit, usually less than 10 pounds. Eat a healthy diet and stay active. You can always lose the weight gained after you quit.  Find ways to improve your mood other than smoking. FOR MORE INFORMATION  www.smokefree.gov  Document Released: 11/06/2001 Document Revised: 03/29/2014 Document Reviewed:  02/21/2012 [employer] Patient Information 2015 [place], [place]. This information is not intended to replace advice given to you by your health care provider. Make sure you discuss any questions you have with your health care provider.  Exercise to Lose Weight Exercise and a healthy diet may help you lose weight. Your doctor may suggest specific exercises. EXERCISE IDEAS AND TIPS  Choose low-cost things you enjoy doing, such as walking, bicycling, or exercising to workout videos.  Take stairs instead of the elevator.  Walk during your lunch break.  Park your car further away from work or school.  Go to a gym or an exercise class.  Start with 5 to 10 minutes of exercise each day. Build up to 30 minutes of exercise 4 to 6 days a week.  Wear shoes with good  support and comfortable clothes.  Stretch before and after working out.  Work out until you breathe harder and your heart beats faster.  Drink extra water when you exercise.  Do not do so much that you hurt yourself, feel dizzy, or get very short of breath. Exercises that burn about 150 calories:  Running 1  miles in 15 minutes.  Playing volleyball for 45 to 60 minutes.  Washing and waxing a car for 45 to 60 minutes.  Playing touch football for 45 minutes.  Walking 1  miles in 35 minutes.  Pushing a stroller 1  miles in 30 minutes.  Playing basketball for 30 minutes.  Raking leaves for 30 minutes.  Bicycling 5 miles in 30 minutes.  Walking 2 miles in 30 minutes.  Dancing for 30 minutes.  Shoveling snow for 15 minutes.  Swimming laps for 20 minutes.  Walking up stairs for 15 minutes.  Bicycling 4 miles in 15 minutes.  Gardening for 30 to 45 minutes.  Jumping rope for 15 minutes.  Washing windows or floors for 45 to 60 minutes. Document Released: 12/15/2010 Document Revised: 02/04/2012 Document Reviewed: 12/15/2010 [facility] Patient Information 2015 [place], [place]. This information is not intended to replace advice given to you by your health care provider. Make sure you discuss any questions you have with your health care provider.

## 2015-05-16 NOTE — Assessment & Plan Note (Signed)
Patient has a known occluded RCA and cath in [place] in 2004. Stress Myoview in 05/2014 was low risk. See above note for details.

## 2015-05-16 NOTE — Assessment & Plan Note (Signed)
Blood pressure stable. Trace of edema. Recommend 2 g sodium diet.

## 2015-05-24 NOTE — Pre-Procedure Instructions (Signed)
    SPARROW SANZO  05/24/2015      Your procedure is scheduled on Tuesday, July 12.  Report to [facility] Admitting at 8:50 A.M.                Your surgery is scheduled at 10:50AM.   Call this number if you have problems the morning of surgery: [phone]                   For any other questions, please call [phone], Monday - Friday 8 AM - 4 PM.   Remember:  Do not eat food or drink liquids after midnight Monday, July 11.  Take these medicines the morning of surgery with A SIP OF WATER :carvedilol (COREG), fexofenadine (ALLEGRA), tamsulosin (FLOMAX), finasteride (PROSCAR).               On Monday, July 4th, STOP Plavix, BC's, Goody's, all vitamins, Herbal products and Aspirin and Aspirin Products.   Do not wear jewelry, make-up or nail polish.  Do not wear lotions, powders, or perfumes.    Men may shave face and neck.  Do not bring valuables to the hospital.  [facility] is not responsible for any belongings or valuables.  Contacts, dentures or bridgework may not be worn into surgery.  Leave your suitcase in the car.  After surgery it may be brought to your room.  For patients admitted to the hospital, discharge time will be determined by your treatment team.  Patients discharged the day of surgery will not be allowed to drive home.   Name and phone number of your driver:  -  Special instructions:  -  Please read over the following fact sheets that you were given. Pain Booklet, Coughing and Deep Breathing, Blood Transfusion Information and Surgical Site Infection Prevention

## 2015-05-25 ENCOUNTER — Encounter (HOSPITAL_COMMUNITY): Payer: Self-pay

## 2015-05-25 ENCOUNTER — Encounter (HOSPITAL_COMMUNITY)
Admission: RE | Admit: 2015-05-25 | Discharge: 2015-05-25 | Disposition: A | Discharge status: Home / Self Care | Payer: Medicare PPO | Source: Ambulatory Visit | Attending: Orthopedic Surgery | Admitting: Orthopedic Surgery

## 2015-05-25 DIAGNOSIS — G4733 Obstructive sleep apnea (adult) (pediatric): Secondary | ICD-10-CM | POA: Insufficient documentation

## 2015-05-25 DIAGNOSIS — Z8673 Personal history of transient ischemic attack (TIA), and cerebral infarction without residual deficits: Secondary | ICD-10-CM | POA: Diagnosis not present

## 2015-05-25 DIAGNOSIS — E119 Type 2 diabetes mellitus without complications: Secondary | ICD-10-CM | POA: Insufficient documentation

## 2015-05-25 DIAGNOSIS — Z01812 Encounter for preprocedural laboratory examination: Secondary | ICD-10-CM | POA: Insufficient documentation

## 2015-05-25 DIAGNOSIS — Z7902 Long term (current) use of antithrombotics/antiplatelets: Secondary | ICD-10-CM | POA: Diagnosis not present

## 2015-05-25 DIAGNOSIS — Z01818 Encounter for other preprocedural examination: Secondary | ICD-10-CM | POA: Insufficient documentation

## 2015-05-25 DIAGNOSIS — Z0183 Encounter for blood typing: Secondary | ICD-10-CM | POA: Diagnosis not present

## 2015-05-25 DIAGNOSIS — I429 Cardiomyopathy, unspecified: Secondary | ICD-10-CM | POA: Insufficient documentation

## 2015-05-25 DIAGNOSIS — E785 Hyperlipidemia, unspecified: Secondary | ICD-10-CM | POA: Diagnosis not present

## 2015-05-25 DIAGNOSIS — Z7982 Long term (current) use of aspirin: Secondary | ICD-10-CM | POA: Insufficient documentation

## 2015-05-25 DIAGNOSIS — Z79899 Other long term (current) drug therapy: Secondary | ICD-10-CM | POA: Diagnosis not present

## 2015-05-25 DIAGNOSIS — I252 Old myocardial infarction: Secondary | ICD-10-CM | POA: Insufficient documentation

## 2015-05-25 DIAGNOSIS — I10 Essential (primary) hypertension: Secondary | ICD-10-CM | POA: Insufficient documentation

## 2015-05-25 DIAGNOSIS — M179 Osteoarthritis of knee, unspecified: Secondary | ICD-10-CM | POA: Diagnosis not present

## 2015-05-25 DIAGNOSIS — D693 Immune thrombocytopenic purpura: Secondary | ICD-10-CM | POA: Insufficient documentation

## 2015-05-25 HISTORY — DX: Hyperlipidemia, unspecified: E78.5

## 2015-05-25 HISTORY — DX: Unspecified osteoarthritis, unspecified site: M19.90

## 2015-05-25 HISTORY — DX: Unspecified visual loss: H54.7

## 2015-05-25 HISTORY — DX: Personal history of other diseases of the musculoskeletal system and connective tissue: Z87.39

## 2015-05-25 HISTORY — DX: Reserved for inherently not codable concepts without codable children: IMO0001

## 2015-05-25 HISTORY — DX: Other specified postprocedural states: Z98.890

## 2015-05-25 HISTORY — DX: Immune thrombocytopenic purpura: D69.3

## 2015-05-25 HISTORY — DX: Nausea with vomiting, unspecified: R11.2

## 2015-05-25 LAB — BASIC METABOLIC PANEL
Anion gap: 8 (ref 5–15)
BUN: 16 mg/dL (ref 6–20)
CO2: 28 mmol/L (ref 22–32)
Calcium: 9 mg/dL (ref 8.9–10.3)
Chloride: 101 mmol/L (ref 101–111)
Creatinine, Ser: 0.81 mg/dL (ref 0.61–1.24)
GFR calc Af Amer: >60 mL/min (ref >60)
GFR calc non Af Amer: >60 mL/min (ref >60)
GLUCOSE: 156 mg/dL — AB (ref 65–99)
Potassium: 4.9 mmol/L (ref 3.5–5.1)
SODIUM: 137 mmol/L (ref 135–145)

## 2015-05-25 LAB — CBC
HEMATOCRIT: 46.2 % (ref 39.0–52.0)
HEMOGLOBIN: 16.2 g/dL (ref 13.0–17.0)
MCH: 32.9 pg (ref 26.0–34.0)
MCHC: 35.1 g/dL (ref 30.0–36.0)
MCV: 93.9 fL (ref 78.0–100.0)
Platelets: 101 10*3/uL — ABNORMAL LOW (ref 150–400)
RBC: 4.92 MIL/uL (ref 4.22–5.81)
RDW: 12.8 % (ref 11.5–15.5)
WBC: 6.5 10*3/uL (ref 4.0–10.5)

## 2015-05-25 LAB — SURGICAL PCR SCREEN
MRSA, PCR: NEGATIVE
STAPHYLOCOCCUS AUREUS: NEGATIVE

## 2015-05-25 LAB — GLUCOSE, CAPILLARY: Glucose-Capillary: 160 mg/dL — ABNORMAL HIGH (ref 65–99)

## 2015-05-25 LAB — ABO/RH: ABO/RH(D): A POS

## 2015-05-25 NOTE — Progress Notes (Signed)
PCP- Dr. Laurann Montana at [place] - Dr. Irish Lack Cardiac Cath- 2005, no stents placed EKG- June 2016 - requested  Echo - 2015 - requested Stress test - 2015 - requested  Requested cardiac clearance from Dr. Irish Lack.  Requested medical clearance from PCP, Dr. Laurann Montana.  Requested hematology clearance from Dr. Curt Bears.

## 2015-05-26 ENCOUNTER — Encounter: Payer: Self-pay | Admitting: Physician Assistant

## 2015-05-26 LAB — HEMOGLOBIN A1C
Hgb A1c MFr Bld: 7.1 % — ABNORMAL HIGH (ref 4.8–5.6)
Mean Plasma Glucose: 157 mg/dL

## 2015-05-26 NOTE — Progress Notes (Addendum)
Anesthesia Chart Review:  Pt is 64 year old male scheduled for L total knee arthroplasty on 06/07/2015 with Dr. Mardelle Matte.   PCP is Dr. Kelton Pillar. Cardiologist is Dr. Irish Lack.    PMH includes: stroke, cardiomyopathy, MI (2004), HTN, hyperlipidemia, idiopathic thrombocytopenia purpura, DM, OSA. Current smoker. BMI 39.   Medications include: ASA, carvedilol, lisinopril, plavix, metformin, pravastatin.   Preoperative labs reviewed.  Platelets 101. Platelets usually range from 80-115.   Saw hematologist Dr. Julien Nordmann for pre-op clearance; see Epic note dated 05/09/15.   EKG 05/16/2015: sinus rhythm with occasional PVCs. Nonspecific intraventricular block. Cannot rule out anterior infarct, age undetermined.   Nuclear stress test 06/23/2014: -Low risk stress nuclear study with a large inferolateral scar and minimal periinfarct ischemia. LV Ejection Fraction: 35%. LV Wall Motion: inferolateral akinesis/mild dyskinesis. Moderately depressed overall systolic function.  Echo 06/23/2014: - Left ventricle: Limited quality images but there appears to be basal and mid inferior and inferolateral hypokinesis. The cavity size was moderately dilated. There was mild focal basal hypertrophy of the septum. Systolic function was mildly to moderately reduced. The estimated ejection fraction was in the range of 40% to 45%. Wall motion was normal; there were no regional wall motion abnormalities. - Aortic valve: There was mild regurgitation. - Aortic root: The aortic root was normal in size. - Ascending aorta: The ascending aorta was mildly dilated measuring 42 mm. - Mitral valve: Mildly thickened leaflets . There was no regurgitation. - Left atrium: The atrium was moderately dilated. - Right ventricle: Systolic function was normal. - Tricuspid valve: There was mild regurgitation. - Pulmonic valve: There was no regurgitation. - Pulmonary arteries: Systolic pressure was within the normal range. -Impressions:  Mildly to moderately impaired systolic function. Diastolic function and trasmitral velocities were not obtained. Limited quality images but there appears to be basal and mid inferior and inferolateral hypokinesis. Thickened and calcified aortic valve with only minimally elevated transaortic velocities. Moderately dilated left atrium.  Pt has medical clearance from Dr. Laurann Montana, cardiac clearance from Dr. Irish Lack, and hematologic clearance from Dr. Julien Nordmann.   If no changes, I anticipate pt can proceed with surgery as scheduled.   Willeen Cass, FNP-BC [facility]/Anesthesiology Phone: [phone] 05/26/2015 1:26 PM

## 2015-05-30 NOTE — Assessment & Plan Note (Signed)
Good compliance and control. Pressure seems appropriate. Weight loss would help. Plan-download for pressure compliance. He should use CPAP while sleeping during his surgical hospitalization.

## 2015-05-30 NOTE — Assessment & Plan Note (Signed)
He claims light cigar smoking but says he gains weight if he tries to quit. Plan-support offered when he is willing to try.

## 2015-05-30 NOTE — Assessment & Plan Note (Signed)
Reminded of good sleep hygiene. Plan-refill temazepam for occasional use

## 2015-05-31 NOTE — Progress Notes (Signed)
Charts merged, orders from Dr Mardelle Matte - had labs ordered, did not included a T/S.  Dr Luanna Cole orders did not include  Labs per anesthesia.  T/S was drawn per anesthesia did include T/S.  Blood bank bracelet account number is not current account number.  I called patient and instructed him through his wife (patient driving car she was in) that he can remove [employer] Bracelet.

## 2015-06-06 MED ORDER — DEXTROSE 5 % IV SOLN
3.0000 g | INTRAVENOUS | Status: AC
  Administered 2015-06-07: 3 g via INTRAVENOUS
  Filled 2015-06-06: qty 3000

## 2015-06-07 ENCOUNTER — Inpatient Hospital Stay (HOSPITAL_COMMUNITY)
Admission: RE | Admit: 2015-06-07 | Discharge: 2015-06-10 | DRG: 470 | Disposition: A | Discharge status: Skilled Nursing Facility | Payer: Medicare PPO | Source: Ambulatory Visit | Attending: Orthopedic Surgery | Admitting: Orthopedic Surgery

## 2015-06-07 ENCOUNTER — Inpatient Hospital Stay (HOSPITAL_COMMUNITY): Payer: Medicare PPO | Admitting: Certified Registered Nurse Anesthetist

## 2015-06-07 ENCOUNTER — Encounter (HOSPITAL_COMMUNITY)
Admission: RE | Disposition: A | Discharge status: Skilled Nursing Facility | Payer: Self-pay | Source: Ambulatory Visit | Attending: Orthopedic Surgery

## 2015-06-07 ENCOUNTER — Encounter (HOSPITAL_COMMUNITY): Payer: Self-pay | Admitting: Certified Registered Nurse Anesthetist

## 2015-06-07 ENCOUNTER — Inpatient Hospital Stay (HOSPITAL_COMMUNITY): Payer: Medicare PPO | Admitting: Vascular Surgery

## 2015-06-07 ENCOUNTER — Inpatient Hospital Stay (HOSPITAL_COMMUNITY): Payer: Medicare PPO

## 2015-06-07 DIAGNOSIS — N400 Enlarged prostate without lower urinary tract symptoms: Secondary | ICD-10-CM | POA: Diagnosis present

## 2015-06-07 DIAGNOSIS — I252 Old myocardial infarction: Secondary | ICD-10-CM | POA: Diagnosis not present

## 2015-06-07 DIAGNOSIS — Z888 Allergy status to other drugs, medicaments and biological substances status: Secondary | ICD-10-CM | POA: Diagnosis not present

## 2015-06-07 DIAGNOSIS — D693 Immune thrombocytopenic purpura: Secondary | ICD-10-CM | POA: Diagnosis present

## 2015-06-07 DIAGNOSIS — Z7902 Long term (current) use of antithrombotics/antiplatelets: Secondary | ICD-10-CM

## 2015-06-07 DIAGNOSIS — E785 Hyperlipidemia, unspecified: Secondary | ICD-10-CM | POA: Diagnosis present

## 2015-06-07 DIAGNOSIS — Z96659 Presence of unspecified artificial knee joint: Secondary | ICD-10-CM

## 2015-06-07 DIAGNOSIS — Z8673 Personal history of transient ischemic attack (TIA), and cerebral infarction without residual deficits: Secondary | ICD-10-CM | POA: Diagnosis not present

## 2015-06-07 DIAGNOSIS — I10 Essential (primary) hypertension: Secondary | ICD-10-CM | POA: Diagnosis present

## 2015-06-07 DIAGNOSIS — E119 Type 2 diabetes mellitus without complications: Secondary | ICD-10-CM | POA: Diagnosis present

## 2015-06-07 DIAGNOSIS — Z79899 Other long term (current) drug therapy: Secondary | ICD-10-CM

## 2015-06-07 DIAGNOSIS — Z7982 Long term (current) use of aspirin: Secondary | ICD-10-CM | POA: Diagnosis not present

## 2015-06-07 DIAGNOSIS — F17290 Nicotine dependence, other tobacco product, uncomplicated: Secondary | ICD-10-CM | POA: Diagnosis present

## 2015-06-07 DIAGNOSIS — M109 Gout, unspecified: Secondary | ICD-10-CM | POA: Diagnosis not present

## 2015-06-07 DIAGNOSIS — M1712 Unilateral primary osteoarthritis, left knee: Principal | ICD-10-CM | POA: Diagnosis present

## 2015-06-07 DIAGNOSIS — D696 Thrombocytopenia, unspecified: Secondary | ICD-10-CM | POA: Diagnosis present

## 2015-06-07 DIAGNOSIS — I429 Cardiomyopathy, unspecified: Secondary | ICD-10-CM | POA: Diagnosis present

## 2015-06-07 HISTORY — DX: Unilateral primary osteoarthritis, left knee: M17.12

## 2015-06-07 HISTORY — PX: TOTAL KNEE ARTHROPLASTY: SHX125

## 2015-06-07 LAB — GLUCOSE, CAPILLARY
GLUCOSE-CAPILLARY: 184 mg/dL — AB (ref 65–99)
Glucose-Capillary: 165 mg/dL — ABNORMAL HIGH (ref 65–99)
Glucose-Capillary: 202 mg/dL — ABNORMAL HIGH (ref 65–99)

## 2015-06-07 LAB — TYPE AND SCREEN
ABO/RH(D): A POS
ABO/RH(D): A POS
ANTIBODY SCREEN: NEGATIVE
Antibody Screen: NEGATIVE

## 2015-06-07 SURGERY — ARTHROPLASTY, KNEE, TOTAL
Anesthesia: General | Site: Knee | Laterality: Left

## 2015-06-07 MED ORDER — HYDROMORPHONE HCL 1 MG/ML IJ SOLN
1.0000 mg | INTRAMUSCULAR | Status: DC | PRN
Start: 2015-06-07 — End: 2015-06-10
  Administered 2015-06-07 – 2015-06-08 (×4): 1 mg via INTRAVENOUS
  Filled 2015-06-07 (×4): qty 1

## 2015-06-07 MED ORDER — ALUM & MAG HYDROXIDE-SIMETH 200-200-20 MG/5ML PO SUSP: 30.0000 mL | ORAL | Status: DC | PRN

## 2015-06-07 MED ORDER — ONDANSETRON HCL 4 MG/2ML IJ SOLN: 4.0000 mg | Freq: Four times a day (QID) | INTRAMUSCULAR | Status: DC | PRN

## 2015-06-07 MED ORDER — LORATADINE 10 MG PO TABS
10.0000 mg | ORAL_TABLET | Freq: Every day | ORAL | Status: DC
  Administered 2015-06-08 – 2015-06-10 (×3): 10 mg via ORAL
  Filled 2015-06-07 (×3): qty 1

## 2015-06-07 MED ORDER — FINASTERIDE 5 MG PO TABS
5.0000 mg | ORAL_TABLET | Freq: Every day | ORAL | Status: DC
Start: 2015-06-08 — End: 2015-06-10
  Administered 2015-06-08 – 2015-06-10 (×3): 5 mg via ORAL
  Filled 2015-06-07 (×3): qty 1

## 2015-06-07 MED ORDER — KETOROLAC TROMETHAMINE 15 MG/ML IJ SOLN
7.5000 mg | Freq: Four times a day (QID) | INTRAMUSCULAR | Status: AC
  Administered 2015-06-07 – 2015-06-08 (×4): 7.5 mg via INTRAVENOUS
  Filled 2015-06-07 (×3): qty 1

## 2015-06-07 MED ORDER — ONDANSETRON HCL 4 MG/2ML IJ SOLN
INTRAMUSCULAR | Status: DC | PRN
  Administered 2015-06-07: 4 mg via INTRAVENOUS

## 2015-06-07 MED ORDER — METHOCARBAMOL 500 MG PO TABS
500.0000 mg | ORAL_TABLET | Freq: Four times a day (QID) | ORAL | Status: DC | PRN
  Administered 2015-06-08 – 2015-06-10 (×5): 500 mg via ORAL
  Filled 2015-06-07 (×5): qty 1

## 2015-06-07 MED ORDER — HYDROMORPHONE HCL 1 MG/ML IJ SOLN
0.2500 mg | INTRAMUSCULAR | Status: DC | PRN
  Administered 2015-06-07 (×2): 0.5 mg via INTRAVENOUS

## 2015-06-07 MED ORDER — METHOCARBAMOL 1000 MG/10ML IJ SOLN: 500.0000 mg | Freq: Four times a day (QID) | INTRAVENOUS | Status: DC | PRN

## 2015-06-07 MED ORDER — MENTHOL 3 MG MT LOZG: 1.0000 | LOZENGE | OROMUCOSAL | Status: DC | PRN

## 2015-06-07 MED ORDER — PROPOFOL 10 MG/ML IV BOLUS
INTRAVENOUS | Status: DC | PRN
  Administered 2015-06-07: 100 mg via INTRAVENOUS
  Administered 2015-06-07: 300 mg via INTRAVENOUS

## 2015-06-07 MED ORDER — LACTATED RINGERS IV SOLN
INTRAVENOUS | Status: DC | PRN
  Administered 2015-06-07 (×2): via INTRAVENOUS

## 2015-06-07 MED ORDER — CARVEDILOL 6.25 MG PO TABS
6.2500 mg | ORAL_TABLET | Freq: Every day | ORAL | Status: DC
  Administered 2015-06-07 – 2015-06-10 (×4): 6.25 mg via ORAL
  Filled 2015-06-07 (×4): qty 1

## 2015-06-07 MED ORDER — METHOCARBAMOL 1000 MG/10ML IJ SOLN
500.0000 mg | INTRAMUSCULAR | Status: AC
  Administered 2015-06-07: 500 mg via INTRAVENOUS
  Filled 2015-06-07: qty 5

## 2015-06-07 MED ORDER — PROMETHAZINE HCL 25 MG/ML IJ SOLN: 6.2500 mg | INTRAMUSCULAR | Status: DC | PRN

## 2015-06-07 MED ORDER — METOCLOPRAMIDE HCL 5 MG/ML IJ SOLN: 5.0000 mg | Freq: Three times a day (TID) | INTRAMUSCULAR | Status: DC | PRN

## 2015-06-07 MED ORDER — DOCUSATE SODIUM 100 MG PO CAPS
100.0000 mg | ORAL_CAPSULE | Freq: Two times a day (BID) | ORAL | Status: DC
  Administered 2015-06-07 – 2015-06-10 (×6): 100 mg via ORAL
  Filled 2015-06-07 (×6): qty 1

## 2015-06-07 MED ORDER — FENTANYL CITRATE (PF) 100 MCG/2ML IJ SOLN
100.0000 ug | Freq: Once | INTRAMUSCULAR | Status: AC
  Administered 2015-06-07 (×4): 50 ug via INTRAVENOUS
  Administered 2015-06-07: 100 ug via INTRAVENOUS
  Administered 2015-06-07: 50 ug via INTRAVENOUS

## 2015-06-07 MED ORDER — POLYETHYLENE GLYCOL 3350 17 G PO PACK: 17.0000 g | PACK | Freq: Every day | ORAL | Status: DC | PRN

## 2015-06-07 MED ORDER — LIDOCAINE HCL (CARDIAC) 20 MG/ML IV SOLN
INTRAVENOUS | Status: DC | PRN
  Administered 2015-06-07: 100 mg via INTRAVENOUS

## 2015-06-07 MED ORDER — SODIUM CHLORIDE 0.9 % IR SOLN
Status: DC | PRN
  Administered 2015-06-07: 1000 mL

## 2015-06-07 MED ORDER — ASPIRIN EC 81 MG PO TBEC
81.0000 mg | DELAYED_RELEASE_TABLET | Freq: Every day | ORAL | Status: DC
  Administered 2015-06-07 – 2015-06-10 (×4): 81 mg via ORAL
  Filled 2015-06-07 (×4): qty 1

## 2015-06-07 MED ORDER — PHENOL 1.4 % MT LIQD: 1.0000 | OROMUCOSAL | Status: DC | PRN

## 2015-06-07 MED ORDER — ACETAMINOPHEN 650 MG RE SUPP: 650.0000 mg | Freq: Four times a day (QID) | RECTAL | Status: DC | PRN

## 2015-06-07 MED ORDER — BACLOFEN 10 MG PO TABS: 10.0000 mg | ORAL_TABLET | Freq: Three times a day (TID) | ORAL | Status: DC

## 2015-06-07 MED ORDER — ROCURONIUM BROMIDE 100 MG/10ML IV SOLN
INTRAVENOUS | Status: DC | PRN
  Administered 2015-06-07: 20 mg via INTRAVENOUS
  Administered 2015-06-07: 30 mg via INTRAVENOUS

## 2015-06-07 MED ORDER — MEPERIDINE HCL 25 MG/ML IJ SOLN: 6.2500 mg | INTRAMUSCULAR | Status: DC | PRN

## 2015-06-07 MED ORDER — CEFAZOLIN SODIUM-DEXTROSE 2-3 GM-% IV SOLR
2.0000 g | Freq: Four times a day (QID) | INTRAVENOUS | Status: AC
  Administered 2015-06-07 – 2015-06-08 (×2): 2 g via INTRAVENOUS
  Filled 2015-06-07 (×2): qty 50

## 2015-06-07 MED ORDER — ACETAMINOPHEN 325 MG PO TABS: 650.0000 mg | ORAL_TABLET | Freq: Four times a day (QID) | ORAL | Status: DC | PRN

## 2015-06-07 MED ORDER — SENNA-DOCUSATE SODIUM 8.6-50 MG PO TABS: 2.0000 | ORAL_TABLET | Freq: Every day | ORAL | Status: DC

## 2015-06-07 MED ORDER — METOCLOPRAMIDE HCL 5 MG PO TABS: 5.0000 mg | ORAL_TABLET | Freq: Three times a day (TID) | ORAL | Status: DC | PRN

## 2015-06-07 MED ORDER — SENNA 8.6 MG PO TABS
1.0000 | ORAL_TABLET | Freq: Two times a day (BID) | ORAL | Status: DC
  Administered 2015-06-07 – 2015-06-10 (×6): 8.6 mg via ORAL
  Filled 2015-06-07 (×6): qty 1

## 2015-06-07 MED ORDER — GLYCOPYRROLATE 0.2 MG/ML IJ SOLN
INTRAMUSCULAR | Status: DC | PRN
  Administered 2015-06-07: .8 mg via INTRAVENOUS

## 2015-06-07 MED ORDER — OXYCODONE HCL 5 MG PO TABS
5.0000 mg | ORAL_TABLET | ORAL | Status: DC | PRN
  Administered 2015-06-07 – 2015-06-10 (×16): 10 mg via ORAL
  Filled 2015-06-07 (×15): qty 2

## 2015-06-07 MED ORDER — DIPHENHYDRAMINE HCL 12.5 MG/5ML PO ELIX: 12.5000 mg | ORAL_SOLUTION | ORAL | Status: DC | PRN

## 2015-06-07 MED ORDER — BISACODYL 10 MG RE SUPP: 10.0000 mg | Freq: Every day | RECTAL | Status: DC | PRN

## 2015-06-07 MED ORDER — POTASSIUM CHLORIDE IN NACL 20-0.45 MEQ/L-% IV SOLN
INTRAVENOUS | Status: DC
  Administered 2015-06-07: 21:00:00 via INTRAVENOUS
  Filled 2015-06-07 (×3): qty 1000

## 2015-06-07 MED ORDER — MIDAZOLAM HCL 2 MG/2ML IJ SOLN
2.0000 mg | Freq: Once | INTRAMUSCULAR | Status: AC
  Administered 2015-06-07: 2 mg via INTRAVENOUS

## 2015-06-07 MED ORDER — RIVAROXABAN 10 MG PO TABS: 10.0000 mg | ORAL_TABLET | Freq: Every day | ORAL | Status: DC

## 2015-06-07 MED ORDER — METFORMIN HCL 500 MG PO TABS
500.0000 mg | ORAL_TABLET | Freq: Every day | ORAL | Status: DC
  Administered 2015-06-08 – 2015-06-10 (×3): 500 mg via ORAL
  Filled 2015-06-07 (×3): qty 1

## 2015-06-07 MED ORDER — MAGNESIUM CITRATE PO SOLN: 1.0000 | Freq: Once | ORAL | Status: AC | PRN

## 2015-06-07 MED ORDER — LISINOPRIL 10 MG PO TABS
10.0000 mg | ORAL_TABLET | Freq: Every day | ORAL | Status: DC
  Administered 2015-06-07 – 2015-06-10 (×2): 10 mg via ORAL
  Filled 2015-06-07 (×4): qty 1

## 2015-06-07 MED ORDER — NEOSTIGMINE METHYLSULFATE 10 MG/10ML IV SOLN
INTRAVENOUS | Status: DC | PRN
Start: 2015-06-07 — End: 2015-06-07
  Administered 2015-06-07: 5 mg via INTRAVENOUS

## 2015-06-07 MED ORDER — ONDANSETRON HCL 4 MG PO TABS: 4.0000 mg | ORAL_TABLET | Freq: Three times a day (TID) | ORAL | Status: DC | PRN

## 2015-06-07 MED ORDER — ACETAMINOPHEN 10 MG/ML IV SOLN
1000.0000 mg | Freq: Four times a day (QID) | INTRAVENOUS | Status: DC
  Administered 2015-06-07: 1000 mg via INTRAVENOUS

## 2015-06-07 MED ORDER — ONDANSETRON HCL 4 MG PO TABS: 4.0000 mg | ORAL_TABLET | Freq: Four times a day (QID) | ORAL | Status: DC | PRN

## 2015-06-07 MED ORDER — PRAVASTATIN SODIUM 40 MG PO TABS
40.0000 mg | ORAL_TABLET | Freq: Every day | ORAL | Status: DC
  Administered 2015-06-07 – 2015-06-10 (×4): 40 mg via ORAL
  Filled 2015-06-07 (×4): qty 1

## 2015-06-07 MED ORDER — TAMSULOSIN HCL 0.4 MG PO CAPS
0.4000 mg | ORAL_CAPSULE | Freq: Every day | ORAL | Status: DC
Start: 2015-06-08 — End: 2015-06-10
  Administered 2015-06-08 – 2015-06-10 (×3): 0.4 mg via ORAL
  Filled 2015-06-07 (×3): qty 1

## 2015-06-07 MED ORDER — SODIUM CHLORIDE 0.9 % IV SOLN
INTRAVENOUS | Status: DC | PRN
  Administered 2015-06-07: 60 mL

## 2015-06-07 MED ORDER — SUCCINYLCHOLINE CHLORIDE 200 MG/10ML IV SOSY
PREFILLED_SYRINGE | INTRAVENOUS | Status: DC | PRN
  Administered 2015-06-07: 120 mg via INTRAVENOUS

## 2015-06-07 MED ORDER — BUPIVACAINE LIPOSOME 1.3 % IJ SUSP
20.0000 mL | INTRAMUSCULAR | Status: DC
  Filled 2015-06-07: qty 20

## 2015-06-07 MED ORDER — RIVAROXABAN 10 MG PO TABS
10.0000 mg | ORAL_TABLET | Freq: Every day | ORAL | Status: DC
  Administered 2015-06-08 – 2015-06-10 (×3): 10 mg via ORAL
  Filled 2015-06-07 (×3): qty 1

## 2015-06-07 MED ORDER — OXYCODONE-ACETAMINOPHEN 10-325 MG PO TABS: 1.0000 | ORAL_TABLET | Freq: Four times a day (QID) | ORAL | Status: DC | PRN

## 2015-06-07 MED ORDER — LACTATED RINGERS IV SOLN: INTRAVENOUS | Status: DC

## 2015-06-07 SURGICAL SUPPLY — 64 items
BANDAGE ELASTIC 4 VELCRO ST LF (GAUZE/BANDAGES/DRESSINGS) ×3 IMPLANT
BANDAGE ELASTIC 6 VELCRO ST LF (GAUZE/BANDAGES/DRESSINGS) ×3 IMPLANT
BANDAGE ESMARK 6X9 LF (GAUZE/BANDAGES/DRESSINGS) ×1 IMPLANT
BENZOIN TINCTURE PRP APPL 2/3 (GAUZE/BANDAGES/DRESSINGS) ×3 IMPLANT
BLADE SAG 18X100X1.27 (BLADE) ×3 IMPLANT
BLADE SAW RECIP 87.9 MT (BLADE) ×3 IMPLANT
BLADE SAW SGTL 13X75X1.27 (BLADE) ×3 IMPLANT
BNDG ESMARK 6X9 LF (GAUZE/BANDAGES/DRESSINGS) ×3
BOOTCOVER CLEANROOM LRG (PROTECTIVE WEAR) ×6 IMPLANT
BOWL SMART MIX CTS (DISPOSABLE) ×3 IMPLANT
CAP KNEE TOTAL 3 SIGMA ×3 IMPLANT
CEMENT HV SMART SET (Cement) ×6 IMPLANT
CLOSURE STERI-STRIP 1/2X4 (GAUZE/BANDAGES/DRESSINGS) ×1
CLSR STERI-STRIP ANTIMIC 1/2X4 (GAUZE/BANDAGES/DRESSINGS) ×2 IMPLANT
COVER SURGICAL LIGHT HANDLE (MISCELLANEOUS) ×3 IMPLANT
CUFF TOURNIQUET SINGLE 34IN LL (TOURNIQUET CUFF) ×3 IMPLANT
DRAPE EXTREMITY T 121X128X90 (DRAPE) ×3 IMPLANT
DRAPE IMP U-DRAPE 54X76 (DRAPES) ×3 IMPLANT
DRAPE U-SHAPE 47X51 STRL (DRAPES) ×3 IMPLANT
DRSG PAD ABDOMINAL 8X10 ST (GAUZE/BANDAGES/DRESSINGS) ×3 IMPLANT
DURAPREP 26ML APPLICATOR (WOUND CARE) ×3 IMPLANT
ELECT CAUTERY BLADE 6.4 (BLADE) ×3 IMPLANT
ELECT REM PT RETURN 9FT ADLT (ELECTROSURGICAL) ×3
ELECTRODE REM PT RTRN 9FT ADLT (ELECTROSURGICAL) ×1 IMPLANT
GAUZE SPONGE 4X4 12PLY STRL (GAUZE/BANDAGES/DRESSINGS) ×3 IMPLANT
GLOVE BIOGEL PI IND STRL 8 (GLOVE) ×1 IMPLANT
GLOVE BIOGEL PI INDICATOR 8 (GLOVE) ×2
GLOVE BIOGEL PI ORTHO PRO SZ8 (GLOVE) ×2
GLOVE ORTHO TXT STRL SZ7.5 (GLOVE) ×3 IMPLANT
GLOVE PI ORTHO PRO STRL SZ8 (GLOVE) ×1 IMPLANT
GLOVE SURG ORTHO 8.0 STRL STRW (GLOVE) ×3 IMPLANT
GOWN STRL REUS W/ TWL XL LVL3 (GOWN DISPOSABLE) ×1 IMPLANT
GOWN STRL REUS W/TWL 2XL LVL3 (GOWN DISPOSABLE) ×3 IMPLANT
GOWN STRL REUS W/TWL XL LVL3 (GOWN DISPOSABLE) ×2
HANDPIECE INTERPULSE COAX TIP (DISPOSABLE) ×2
HOOD PEEL AWAY FACE SHEILD DIS (HOOD) ×6 IMPLANT
IMMOBILIZER KNEE 22 (SOFTGOODS) ×3 IMPLANT
KIT BASIN OR (CUSTOM PROCEDURE TRAY) ×3 IMPLANT
KIT ROOM TURNOVER OR (KITS) ×3 IMPLANT
MANIFOLD NEPTUNE II (INSTRUMENTS) ×3 IMPLANT
NEEDLE 18GX1X1/2 (RX/OR ONLY) (NEEDLE) ×3 IMPLANT
NS IRRIG 1000ML POUR BTL (IV SOLUTION) ×3 IMPLANT
PACK TOTAL JOINT (CUSTOM PROCEDURE TRAY) ×3 IMPLANT
PACK UNIVERSAL I (CUSTOM PROCEDURE TRAY) ×3 IMPLANT
PAD ABD 8X10 STRL (GAUZE/BANDAGES/DRESSINGS) ×3 IMPLANT
PAD ARMBOARD 7.5X6 YLW CONV (MISCELLANEOUS) ×6 IMPLANT
PAD CAST 4YDX4 CTTN HI CHSV (CAST SUPPLIES) ×1 IMPLANT
PADDING CAST COTTON 4X4 STRL (CAST SUPPLIES) ×2
PADDING CAST COTTON 6X4 STRL (CAST SUPPLIES) ×3 IMPLANT
SET HNDPC FAN SPRY TIP SCT (DISPOSABLE) ×1 IMPLANT
SPONGE GAUZE 4X4 12PLY STER LF (GAUZE/BANDAGES/DRESSINGS) ×3 IMPLANT
SUCTION FRAZIER TIP 10 FR DISP (SUCTIONS) ×3 IMPLANT
SUT MNCRL AB 4-0 PS2 18 (SUTURE) IMPLANT
SUT VIC AB 0 CT1 27 (SUTURE) ×2
SUT VIC AB 0 CT1 27XBRD ANBCTR (SUTURE) ×1 IMPLANT
SUT VIC AB 2-0 CT1 27 (SUTURE) ×2
SUT VIC AB 2-0 CT1 TAPERPNT 27 (SUTURE) ×1 IMPLANT
SUT VIC AB 3-0 SH 8-18 (SUTURE) ×6 IMPLANT
SYR 30ML LL (SYRINGE) IMPLANT
SYR 50ML LL SCALE MARK (SYRINGE) ×3 IMPLANT
TOWEL OR 17X24 6PK STRL BLUE (TOWEL DISPOSABLE) ×3 IMPLANT
TOWEL OR 17X26 10 PK STRL BLUE (TOWEL DISPOSABLE) ×3 IMPLANT
TRAY CATH 16FR W/PLASTIC CATH (SET/KITS/TRAYS/PACK) IMPLANT
WATER STERILE IRR 1000ML POUR (IV SOLUTION) ×6 IMPLANT

## 2015-06-07 NOTE — Progress Notes (Signed)
RT set up CPAP with nasal mask for patient. Patient states he will place himself on when ready. RT will continue to monitor.

## 2015-06-07 NOTE — H&P (Signed)
PREOPERATIVE H&P  Chief Complaint: DJD LEFT KNEE  HPI: Gordon Madden is a 64 y.o. male who presents for preoperative history and physical with a diagnosis of DJD LEFT KNEE. Symptoms are rated as moderate to severe, and have been worsening.  This is significantly impairing activities of daily living.  He has elected for surgical management.   He has failed injections, activity modification, anti-inflammatories, and assistive devices.  Preoperative X-rays demonstrate end stage degenerative changes with osteophyte formation, loss of joint space, subchondral sclerosis.   Past Medical History  Diagnosis Date  . BPH (benign prostatic hyperplasia)   . CVA (cerebral infarction)     Renal artery branch occlusion  . Myocardial infarction   . Cardiomyopathy     EF 40%  . Rhinosinusitis   . Diabetes mellitus, type 2   . Colon polyps   . Thrombocytopenia   . OSA (obstructive sleep apnea)     07/25/10 AHI 14.9/hr  . PONV (postoperative nausea and vomiting)   . Hypertension   . Myocardial infarction     pt. states diagnosed in 2005  . Stroke   . Vision decreased   . Hyperlipidemia   . Sleep apnea   . Shortness of breath dyspnea   . History of pneumonia   . History of bronchitis   . Diabetes mellitus without complication     type II  . History of gout   . Arthritis   . Idiopathic thrombocytopenia    Past Surgical History  Procedure Laterality Date  . Knee cartilage surgery      left  . Umbilical hernia repair    . Sinus irrigation    . Meniscus repair Left   . Nasal sinus surgery    . Cardiac catheterization  2005    no stents  . Cyst excision      chest and back  . Hernia repair      umbilical   History   Social History  . Marital Status: Married    Spouse Name: N/A  . Number of Children: N/A  . Years of Education: N/A   Occupational History  . Pricing Coordinator    Social History Main Topics  . Smoking status: Current Every Day Smoker    Types: Cigars  .  Smokeless tobacco: Never Used     Comment: stopped smoking cigarettes in 1991 but currently smokes cigars. Pt. states he plans on stopping 05/27/2015  . Alcohol Use: Yes     Comment: 5 drinks a week  . Drug Use: No  . Sexual Activity: Not on file   Other Topics Concern  . None   Social History Narrative   ** Merged History Encounter **       Family History  Problem Relation Age of Onset  . Colon cancer Neg Hx   . Hypertension Father    Allergies  Allergen Reactions  . Plavix [Clopidogrel]     thrombocytopenia   Prior to Admission medications   Medication Sig Start Date End Date Taking? Authorizing Provider  acetaminophen (TYLENOL) 325 MG tablet Take 650 mg by mouth every 6 (six) hours as needed (pain).   Yes Historical Provider, MD  aspirin EC 81 MG tablet Take 81 mg by mouth daily.   Yes Historical Provider, MD  carvedilol (COREG) 6.25 MG tablet Take 6.25 mg by mouth daily.   Yes Historical Provider, MD  clopidogrel (PLAVIX) 75 MG tablet Take 75 mg by mouth daily.   Yes Historical Provider, MD  Coenzyme Q10 (CO Q 10 PO) Take 1 tablet by mouth daily.   Yes Historical Provider, MD  fexofenadine (ALLEGRA) 180 MG tablet Take 180 mg by mouth daily.   Yes Historical Provider, MD  finasteride (PROSCAR) 5 MG tablet Take 5 mg by mouth daily.   Yes Historical Provider, MD  folic acid (FOLVITE) 712 MCG tablet Take 400 mcg by mouth daily.   Yes Historical Provider, MD  lisinopril (PRINIVIL,ZESTRIL) 10 MG tablet Take 10 mg by mouth daily.   Yes Historical Provider, MD  metFORMIN (GLUCOPHAGE) 500 MG tablet Take 500 mg by mouth daily with breakfast.   Yes Historical Provider, MD  Omega-3 Fatty Acids (FISH OIL PO) Take 1 tablet by mouth daily.   Yes Historical Provider, MD  pravastatin (PRAVACHOL) 40 MG tablet Take 40 mg by mouth daily.   Yes Historical Provider, MD  tamsulosin (FLOMAX) 0.4 MG CAPS capsule Take 0.4 mg by mouth daily.   Yes Historical Provider, MD  temazepam (RESTORIL) 15 MG  capsule Take 1 capsule (15 mg total) by mouth at bedtime as needed for sleep. Patient not taking: Reported on 06/07/2015 03/31/14   Deneise Lever, MD     Positive ROS: All other systems have been reviewed and were otherwise negative with the exception of those mentioned in the HPI and as above.  Physical Exam: General: Alert, no acute distress Cardiovascular: No pedal edema Respiratory: No cyanosis, no use of accessory musculature GI: No organomegaly, abdomen is soft and non-tender Skin: No lesions in the area of chief complaint Neurologic: Sensation intact distally Psychiatric: Patient is competent for consent with normal mood and affect Lymphatic: No axillary or cervical lymphadenopathy  MUSCULOSKELETAL: Left knee has range of motion 0-110, stable to varus and valgus testing, positive crepitance, varus overall alignment.  Assessment: DJD LEFT KNEE  Plan: Plan for Procedure(s): TOTAL KNEE ARTHROPLASTY  The risks benefits and alternatives were discussed with the patient including but not limited to the risks of nonoperative treatment, versus surgical intervention including infection, bleeding, nerve injury,  blood clots, cardiopulmonary complications, morbidity, mortality, among others, and they were willing to proceed.   Johnny Bridge, MD Cell [phone]   06/07/2015 9:41 AM

## 2015-06-07 NOTE — Anesthesia Preprocedure Evaluation (Addendum)
Anesthesia Evaluation  Patient identified by MRN, date of birth, ID band Patient awake    Reviewed: Allergy & Precautions, NPO status , Patient's Chart, lab work & pertinent test results, reviewed documented beta blocker date and time   History of Anesthesia Complications (+) PONV and history of anesthetic complications  Airway Mallampati: II  TM Distance: >3 FB Neck ROM: Full    Dental no notable dental hx.    Pulmonary neg pulmonary ROS, shortness of breath, sleep apnea , Current Smoker,  breath sounds clear to auscultation  Pulmonary exam normal       Cardiovascular hypertension, Pt. on home beta blockers and Pt. on medications + CAD, + Past MI and +CHF Normal cardiovascular examRhythm:Regular Rate:Normal     Neuro/Psych PSYCHIATRIC DISORDERS CVA, Residual Symptoms    GI/Hepatic negative GI ROS, Neg liver ROS,   Endo/Other  diabetes, Type 2, Oral Hypoglycemic AgentsMorbid obesity  Renal/GU negative Renal ROS     Musculoskeletal  (+) Arthritis -,   Abdominal (+) + obese,   Peds  Hematology negative hematology ROS (+)   Anesthesia Other Findings   Reproductive/Obstetrics                            Anesthesia Physical Anesthesia Plan  ASA: III  Anesthesia Plan: General   Post-op Pain Management:    Induction: Intravenous  Airway Management Planned: Oral ETT  Additional Equipment: None  Intra-op Plan:   Post-operative Plan: Extubation in OR  Informed Consent: I have reviewed the patients History and Physical, chart, labs and discussed the procedure including the risks, benefits and alternatives for the proposed anesthesia with the patient or authorized representative who has indicated his/her understanding and acceptance.   Dental advisory given  Plan Discussed with: CRNA  Anesthesia Plan Comments:         Anesthesia Quick Evaluation

## 2015-06-07 NOTE — Progress Notes (Signed)
Utilization review completed.  

## 2015-06-07 NOTE — Transfer of Care (Signed)
Immediate Anesthesia Transfer of Care Note  Patient: Gordon Madden  Procedure(s) Performed: Procedure(s): TOTAL KNEE ARTHROPLASTY (Left)  Patient Location: PACU  Anesthesia Type:General and Regional  Level of Consciousness: awake, alert  and oriented  Airway & Oxygen Therapy: Patient Spontanous Breathing and Patient connected to face mask oxygen  Post-op Assessment: Report given to RN, Post -op Vital signs reviewed and stable and Patient moving all extremities X 4  Post vital signs: Reviewed and stable  Last Vitals:  Filed Vitals:   06/07/15 0904  BP: 138/81  Pulse: 59  Temp: 36.9 C  Resp: 20    Complications: No apparent anesthesia complications

## 2015-06-07 NOTE — Anesthesia Procedure Notes (Addendum)
Procedure Name: Intubation Date/Time: 06/07/2015 10:48 AM Performed by: Garrison Columbus T Pre-anesthesia Checklist: Patient identified, Emergency Drugs available, Patient being monitored, Timeout performed and Suction available Patient Re-evaluated:Patient Re-evaluated prior to inductionOxygen Delivery Method: Circle system utilized Preoxygenation: Pre-oxygenation with 100% oxygen Intubation Type: IV induction Ventilation: Two handed mask ventilation required and Oral airway inserted - appropriate to patient size Laryngoscope Size: Mac and 4 Grade View: Grade I Tube type: Oral Tube size: 7.5 mm Number of attempts: 1 Airway Equipment and Method: Stylet and Oral airway Placement Confirmation: ETT inserted through vocal cords under direct vision,  positive ETCO2,  CO2 detector and breath sounds checked- equal and bilateral Secured at: 24 cm Tube secured with: Tape Dental Injury: Teeth and Oropharynx as per pre-operative assessment    Anesthesia Regional Block:  Femoral nerve block  Pre-Anesthetic Checklist: ,, timeout performed, Correct Patient, Correct Site, Correct Laterality, Correct Procedure, Correct Position, site marked, Risks and benefits discussed, Surgical consent,  Pre-op evaluation,  Post-op pain management  Laterality: Left  Prep: chloraprep       Needles:  Injection technique: Single-shot  Needle Type: Stimulator Needle - 80     Needle Length: 9cm 9 cm Needle Gauge: 22 and 22 G  Needle insertion depth: 6 cm   Additional Needles:  Procedures: ultrasound guided (picture in chart) and nerve stimulator Femoral nerve block  Nerve Stimulator or Paresthesia:  Response: Patellar snap, 0.8 mA,   Additional Responses:   Narrative:  Injection made incrementally with aspirations every 5 mL.  Performed by: Personally  Anesthesiologist: Nolon Nations  Additional Notes: BP cuff, EKG monitors applied. Sedation begun. Femoral artery palpated for location of nerve. After  nerve location verified with U/S, anesthetic injected incrementally, slowly, and after negative aspirations under direct u/s guidance. Good perineural spread. Patient tolerated well.

## 2015-06-07 NOTE — Anesthesia Postprocedure Evaluation (Signed)
Anesthesia Post Note  Patient: Gordon Madden  Procedure(s) Performed: Procedure(s) (LRB): TOTAL KNEE ARTHROPLASTY (Left)  Anesthesia type: General + femoral nerve block  Patient location: PACU  Post pain: Pain level controlled  Post assessment: Post-op Vital signs reviewed  Last Vitals: BP 157/94 mmHg  Pulse 60  Temp(Src) 36.6 C (Oral)  Resp 18  Wt 242 lb (109.77 kg)  SpO2 95%  Post vital signs: Reviewed  Level of consciousness: sedated  Complications: No apparent anesthesia complications

## 2015-06-07 NOTE — Care Management Note (Signed)
Case Management Note  Patient Details  Name: Gordon Madden MRN: [id number] Date of Birth: 1951/01/05  Subjective/Objective:       S/p LEFT  TOTAL KNEE ARTHROPLASTY            Action/Plan:   Expected Discharge Date:                  Expected Discharge Plan:     In-House Referral:     Discharge planning Services  CM Consult  Post Acute Care Choice:    Choice offered to:     DME Arranged:    DME Agency:     HH Arranged:    [place] Agency:     Status of Service:  In process, will continue to follow  Medicare Important Message Given:    Date Medicare IM Given:    Medicare IM give by:    Date Additional Medicare IM Given:    Additional Medicare Important Message give by:     If discussed at [place] of Stay Meetings, dates discussed:    Additional Comments:  Dimas Aguas, RN 06/07/2015, 8:23 PM

## 2015-06-07 NOTE — Op Note (Signed)
DATE OF SURGERY:  06/07/2015 TIME: 12:45 PM  PATIENT NAME:  Gordon Madden   AGE: 64 y.o.    PRE-OPERATIVE DIAGNOSIS:  DJD LEFT KNEE  POST-OPERATIVE DIAGNOSIS:  Same  PROCEDURE:  Procedure(s): TOTAL KNEE ARTHROPLASTY   SURGEON:  Johnny Bridge, MD   ASSISTANT:  Joya Gaskins, OPA-C, present and scrubbed throughout the case, critical for assistance with exposure, retraction, instrumentation, and closure.   OPERATIVE IMPLANTS: Depuy PFC Sigma, Posterior Stabilized.  Femur size 5, Tibia size 5, Patella size 41 3-peg oval button, with a 10 mm polyethylene insert.   PREOPERATIVE INDICATIONS:  [name] is a 64 y.o. 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   The risks, benefits, and alternatives were discussed at length including but not limited to the risks of infection, bleeding, nerve injury, stiffness, blood clots, the need for revision surgery, cardiopulmonary complications, among others, and they were willing to proceed.  OPERATIVE FINDINGS AND UNIQUE ASPECTS OF THE CASE:  He had an extremely large leg. There was extensive grade 4 changes throughout the patellofemoral and medial compartment. The lateral compartment was relatively preserved with the exception of substantial osteophyte formation rimming the entirety of the joint.  OPERATIVE DESCRIPTION:  The patient was brought to the operative room and placed in a supine position.  General anesthesia was administered.  IV antibiotics were given.  The lower extremity was prepped and draped in the usual sterile fashion.  Time out was performed.  The leg was elevated and exsanguinated and the tourniquet was inflated.  Anterior quadriceps tendon splitting approach was  performed.  The patella was everted and osteophytes were removed.  The anterior horn of the medial and lateral meniscus was removed.   The distal femur was opened with the drill and the intramedullary distal femoral cutting jig was utilized, set at 5 degrees resecting 11 mm off the distal femur.  Care was taken to protect the collateral ligaments.  Then the extramedullary tibial cutting jig was utilized making the appropriate cut using the anterior tibial crest as a reference building in appropriate posterior slope.  Care was taken during the cut to protect the medial and collateral ligaments.  The proximal tibia was removed along with the posterior horns of the menisci.  The PCL was sacrificed.    The extensor gap was measured and was approximately 52mm.    The distal femoral sizing jig was applied, taking care to avoid notching.  Then the 4-in-1 cutting jig was applied and the anterior and posterior femur was cut, along with the chamfer cuts.  All posterior osteophytes were removed.  The flexion gap was then measured and was symmetric with the extension gap.  I completed the distal femoral preparation using the appropriate jig to prepare the box.  The patella was then measured, and cut with the saw.  The thickness before the cut was 26 and after the cut was 16.the jig did not fit on the patella, so I did perform the cut freehand.  The proximal tibia sized and prepared accordingly with the reamer and the punch, and then all components were trialed with the 87mm poly insert.  The knee was found to have excellent balance and full motion.    The above named components were then cemented into place and all excess  cement was removed.  The real polyethylene implant was placed.  After the cement had cured I released the tourniquet and confirmed excellent hemostasis with no major posterior vessel injury.    The knee was easily taken through a range of motion and the patella tracked well and the knee  irrigated copiously and the parapatellar and subcutaneous tissue closed with vicryl, and monocryl with steri strips for the skin.  The wounds were injected with marcaine, and dressed with sterile gauze and the patient was awakened and returned to the PACU in stable and satisfactory condition.  There were no complications.  Total tourniquet time was ~88 minutes.

## 2015-06-07 NOTE — Progress Notes (Signed)
Patient awaits PT/OT evaluations.

## 2015-06-08 ENCOUNTER — Encounter (HOSPITAL_COMMUNITY): Payer: Self-pay | Admitting: Orthopedic Surgery

## 2015-06-08 LAB — CBC
HCT: 36.7 % — ABNORMAL LOW (ref 39.0–52.0)
HEMOGLOBIN: 12.9 g/dL — AB (ref 13.0–17.0)
MCH: 33.3 pg (ref 26.0–34.0)
MCHC: 35.1 g/dL (ref 30.0–36.0)
MCV: 94.8 fL (ref 78.0–100.0)
Platelets: 81 10*3/uL — ABNORMAL LOW (ref 150–400)
RBC: 3.87 MIL/uL — AB (ref 4.22–5.81)
RDW: 13 % (ref 11.5–15.5)
WBC: 7 10*3/uL (ref 4.0–10.5)

## 2015-06-08 LAB — GLUCOSE, CAPILLARY
GLUCOSE-CAPILLARY: 175 mg/dL — AB (ref 65–99)
Glucose-Capillary: 193 mg/dL — ABNORMAL HIGH (ref 65–99)
Glucose-Capillary: 157 mg/dL — ABNORMAL HIGH (ref 65–99)
Glucose-Capillary: 157 mg/dL — ABNORMAL HIGH (ref 65–99)

## 2015-06-08 LAB — BASIC METABOLIC PANEL
Anion gap: 6 (ref 5–15)
BUN: 21 mg/dL — AB (ref 6–20)
CHLORIDE: 101 mmol/L (ref 101–111)
CO2: 28 mmol/L (ref 22–32)
Calcium: 8.4 mg/dL — ABNORMAL LOW (ref 8.9–10.3)
Creatinine, Ser: 0.95 mg/dL (ref 0.61–1.24)
GFR calc Af Amer: >60 mL/min (ref >60)
Glucose, Bld: 154 mg/dL — ABNORMAL HIGH (ref 65–99)
Potassium: 5.4 mmol/L — ABNORMAL HIGH (ref 3.5–5.1)
SODIUM: 135 mmol/L (ref 135–145)

## 2015-06-08 MED ORDER — SODIUM CHLORIDE 0.45 % IV SOLN
INTRAVENOUS | Status: DC
  Administered 2015-06-08 – 2015-06-09 (×2): via INTRAVENOUS

## 2015-06-08 NOTE — Evaluation (Signed)
Physical Therapy Evaluation Patient Details Name: Gordon Madden MRN: [id number] DOB: January 19, 1951 Today's Date: 06/08/2015   History of Present Illness  Patient is a 64 y/o male s/p L TKA. PMH includes DM, OSA, CVA, HTN, gout, HLD.  Clinical Impression  Patient presents with pain and post surgical deficits LLE s/p L TKA. Education provided on knee precautions and HEP. Pt with left knee stability and buckling noted during gait. PM session will focus on gait training. Pt will have support at home from wife. Will continue to follow to maximize independence and mobility prior to return home.    Follow Up Recommendations Home health PT;Supervision/Assistance - 24 hour    Equipment Recommendations  Rolling walker with 5" wheels    Recommendations for Other Services OT consult     Precautions / Restrictions Precautions Precautions: Fall Required Braces or Orthoses: Knee Immobilizer - Left Restrictions Weight Bearing Restrictions: Yes      Mobility  Bed Mobility Overal bed mobility: Needs Assistance Bed Mobility: Supine to Sit     Supine to sit: Min guard;HOB elevated     General bed mobility comments: Min guard for safety. use of rails. Cues for technique.  Transfers Overall transfer level: Needs assistance Equipment used: Rolling walker (2 wheeled) Transfers: Sit to/from Stand Sit to Stand: Min assist         General transfer comment: Min A to boost from EOB with cues for hand placement and technique.  Ambulation/Gait Ambulation/Gait assistance: Min assist Ambulation Distance (Feet): 7 Feet Assistive device: Rolling walker (2 wheeled) Gait Pattern/deviations: Step-to pattern;Decreased stance time - right;Decreased step length - left;Trunk flexed   Gait velocity interpretation: Below normal speed for age/gender General Gait Details: Pt with left knee instability and knee buckling noted. Unsteady. Min A for balance/safety.  Stairs            Wheelchair  Mobility    Modified Rankin (Stroke Patients Only)       Balance Overall balance assessment: Needs assistance Sitting-balance support: Feet supported;No upper extremity supported Sitting balance-Leahy Scale: Good     Standing balance support: During functional activity Standing balance-Leahy Scale: Poor Standing balance comment: Relient on RW for support.                              Pertinent Vitals/Pain Pain Assessment: 0-10 Pain Score: 7  Pain Location: left knee Pain Descriptors / Indicators: Sore Pain Intervention(s): Monitored during session;Repositioned;Premedicated before session    Home Living Family/patient expects to be discharged to:: Private residence Living Arrangements: Spouse/significant other Available Help at Discharge: Family;Available PRN/intermittently Type of Home: House Home Access: Stairs to enter Entrance Stairs-Rails: None Entrance Stairs-Number of Steps: 2 + 2 Home Layout: One level Home Equipment: Crutches      Prior Function Level of Independence: Independent         Comments: Pt very active. Mowing lawn, cleaning etc.     Hand Dominance        Extremity/Trunk Assessment   Upper Extremity Assessment: Defer to OT evaluation           Lower Extremity Assessment: LLE deficits/detail   LLE Deficits / Details: Limited AROM and strength 2/2 to pain and post surgery.     Communication   Communication: No difficulties  Cognition Arousal/Alertness: Awake/alert Behavior During Therapy: WFL for tasks assessed/performed Overall Cognitive Status: Within Functional Limits for tasks assessed  General Comments General comments (skin integrity, edema, etc.): Wife present for session.    Exercises Total Joint Exercises Ankle Circles/Pumps: Both;10 reps;Seated Quad Sets: Both;10 reps;Seated Gluteal Sets: Both;10 reps;Seated Towel Squeeze: Both;10 reps;Seated Heel Slides: Left;5  reps;Seated Hip ABduction/ADduction: 10 reps;Both;Seated Goniometric ROM: 8-90 degrees knee AROM      Assessment/Plan    PT Assessment Patient needs continued PT services  PT Diagnosis Difficulty walking;Generalized weakness;Acute pain   PT Problem List Decreased strength;Pain;Decreased range of motion;Impaired sensation;Decreased balance;Decreased mobility  PT Treatment Interventions Balance training;Gait training;Functional mobility training;Therapeutic exercise;Therapeutic activities;Stair training;Patient/family education;DME instruction   PT Goals (Current goals can be found in the Care Plan section) Acute Rehab PT Goals Patient Stated Goal: to get back to independence PT Goal Formulation: With patient Time For Goal Achievement: 06/22/15 Potential to Achieve Goals: Good    Frequency 7X/week   Barriers to discharge Inaccessible home environment steps to climb to enter home.    Co-evaluation               End of Session Equipment Utilized During Treatment: Gait belt;Left knee immobilizer Activity Tolerance: Patient tolerated treatment well Patient left: with family/visitor present;in chair;with call bell/phone within reach Nurse Communication: Mobility status         Time: 1829-9371 PT Time Calculation (min) (ACUTE ONLY): 38 min   Charges:   PT Evaluation $Initial PT Evaluation Tier I: 1 Procedure PT Treatments $Therapeutic Activity: 8-22 mins $Self Care/Home Management: 8-22   PT G Codes:        Roizy Harold A Mahlet Jergens 06/08/2015, 11:13 AM Wray Kearns, PT, DPT [phone]

## 2015-06-08 NOTE — Progress Notes (Addendum)
Patient ID: Gordon Madden, male   DOB: 04-07-1951, 64 y.o.   MRN: [id number]     Subjective:  Patient reports pain as mild to moderate.  Patient states that he is doing good and is sitting on the side of the bed and in no acute distress.  He denies any CP or SOB  Objective:   VITALS:   Filed Vitals:   06/07/15 1833 06/07/15 2043 06/08/15 0041 06/08/15 0631  BP: 101/59 107/56 114/55 105/49  Pulse: 62 67 77 67  Temp: 97.7 F (36.5 C) 98.5 F (36.9 C) 98.2 F (36.8 C) 98.7 F (37.1 C)  TempSrc: Oral Oral Oral Oral  Resp: 16 18 16 16   Weight:      SpO2: 98% 96% 95% 92%    ABD soft Sensation intact distally Dorsiflexion/Plantar flexion intact Incision: dressing C/D/I and no drainage Good hip foot and ankle motion  Lab Results  Component Value Date   WBC 7.0 06/08/2015   HGB 12.9* 06/08/2015   HCT 36.7* 06/08/2015   MCV 94.8 06/08/2015   PLT 81* 06/08/2015   BMET    Component Value Date/Time   NA 135 06/08/2015 0406   NA 137 05/09/2015 1111   K 5.4* 06/08/2015 0406   K 4.7 05/09/2015 1111   CL 101 06/08/2015 0406   CO2 28 06/08/2015 0406   CO2 22 05/09/2015 1111   GLUCOSE 154* 06/08/2015 0406   GLUCOSE 180* 05/09/2015 1111   BUN 21* 06/08/2015 0406   BUN 15.2 05/09/2015 1111   CREATININE 0.95 06/08/2015 0406   CREATININE 0.8 05/09/2015 1111   CALCIUM 8.4* 06/08/2015 0406   CALCIUM 8.7 05/09/2015 1111   GFRNONAA >60 06/08/2015 0406   GFRAA >60 06/08/2015 0406     Assessment/Plan: 1 Day Post-Op   Principal Problem:   Primary localized osteoarthritis of left knee Active Problems:   Knee osteoarthritis thrombocytopenia  Advance diet Up with therapy Plan for discharge tomorrow Plan for Dressing change tomorrow WBAT    Gordon Madden 06/08/2015, 8:51 AM  Discussed and agree with above.  Monitor platelets given xarelto.  [name], MD Cell [phone]

## 2015-06-08 NOTE — Progress Notes (Addendum)
Physical Therapy Treatment Patient Details Name: Gordon Madden MRN: [id number] DOB: [birth year] Today's Date: 06/08/2015    History of Present Illness Patient is a 64 y/o male s/p L TKA. PMH includes DM, OSA, CVA, HTN, gout, HLD.    PT Comments    Patient progressing slowly with mobility. Very unsteady on feet. Concerned about going home as he is not sure wife will be able to assist. Pt continues to have left knee instability and knee buckling during gait. Rec wearing KI until able to perform SLR. Discharge recommendation updated to [facility] as pt high falls risk. Will continue to follow to maximize independence and mobility.   Follow Up Recommendations  SNF;Supervision/Assistance - 24 hour     Equipment Recommendations  Rolling walker with 5" wheels    Recommendations for Other Services       Precautions / Restrictions Precautions Precautions: Fall Required Braces or Orthoses: Knee Immobilizer - Left Restrictions Weight Bearing Restrictions: Yes LLE Weight Bearing: Weight bearing as tolerated    Mobility  Bed Mobility Overal bed mobility: Needs Assistance Bed Mobility: Sit to Supine     Sit to supine: Min assist   General bed mobility comments: Min A to bring LLE into bed.   Transfers Overall transfer level: Needs assistance Equipment used: Rolling walker (2 wheeled) (bari RW) Transfers: Sit to/from Stand Sit to Stand: Min assist        General transfer comment: Min A to boost from chair with cues for hand placement and technique. Stood from chair x2. 1 seated rest break.  Ambulation/Gait Ambulation/Gait assistance: Min assist Ambulation Distance (Feet): 20 Feet (+ 40') Assistive device: Rolling walker (2 wheeled) (bari RW) Gait Pattern/deviations: Step-to pattern;Decreased stance time - left;Decreased step length - right;Trunk flexed   Gait velocity interpretation: Below normal speed for age/gender General Gait Details: Pt with left knee instability with knee  buckling noted on multiple occasions. Unsteady. Min A for balance/safety. LOB posteriorly on 2 occasions.    Stairs            Wheelchair Mobility    Modified Rankin (Stroke Patients Only)       Balance Overall balance assessment: Needs assistance Sitting-balance support: Feet supported;No upper extremity supported Sitting balance-Leahy Scale: Good     Standing balance support: During functional activity Standing balance-Leahy Scale: Poor Standing balance comment: Heavy reliance on RW.    Pt's weight is wrong in chart. Pt weighs ~325 lbs per his report.                Cognition Arousal/Alertness: Awake/alert Behavior During Therapy: WFL for tasks assessed/performed Overall Cognitive Status: Within Functional Limits for tasks assessed                      Exercises      General Comments        Pertinent Vitals/Pain Pain Assessment: 0-10 Pain Score: 5  Pain Location: left knee Pain Descriptors / Indicators: Sore;Aching Pain Intervention(s): Limited activity within patient's tolerance;Monitored during session;Repositioned;Ice applied    Home Living Family/patient expects to be discharged to:: Private residence Living Arrangements: Spouse/significant other Available Help at Discharge: Family;Available PRN/intermittently Type of Home: House Home Access: Stairs to enter Entrance Stairs-Rails: None Home Layout: One level Home Equipment: Crutches      Prior Function Level of Independence: Independent      Comments: Pt very active. Mowing lawn, cleaning etc.   PT Goals (current goals can now be found in the care  plan section) Acute Rehab PT Goals Patient Stated Goal: to not fall Progress towards PT goals: Progressing toward goals    Frequency  7X/week    PT Plan Current plan remains appropriate    Co-evaluation             End of Session Equipment Utilized During Treatment: Gait belt;Left knee immobilizer Activity Tolerance:  Patient tolerated treatment well Patient left: in bed;with call bell/phone within reach     Time: 2103-1281 PT Time Calculation (min) (ACUTE ONLY): 37 min  Charges:  $Gait Training: 8-22 mins $Therapeutic Activity: 8-22 mins                    G Codes:      Ismahan Lippman A Lillie Bollig 06/08/2015, 3:56 PM Wray Kearns, [place], DPT [phone]

## 2015-06-08 NOTE — Discharge Instructions (Signed)
INSTRUCTIONS AFTER JOINT REPLACEMENT  ° °o Remove items at home which could result in a fall. This includes throw rugs or furniture in walking pathways °o ICE to the affected joint every three hours while awake for 30 minutes at a time, for at least the first 3-5 days, and then as needed for pain and swelling.  Continue to use ice for pain and swelling. You may notice swelling that will progress down to the foot and ankle.  This is normal after surgery.  Elevate your leg when you are not up walking on it.   °o Continue to use the breathing machine you got in the hospital (incentive spirometer) which will help keep your temperature down.  It is common for your temperature to cycle up and down following surgery, especially at night when you are not up moving around and exerting yourself.  The breathing machine keeps your lungs expanded and your temperature down. ° ° °DIET:  As you were doing prior to hospitalization, we recommend a well-balanced diet. ° °DRESSING / WOUND CARE / SHOWERING ° °You may change your dressing 3-5 days after surgery.  Then change the dressing every day with sterile gauze.  Please use good hand washing techniques before changing the dressing.  Do not use any lotions or creams on the incision until instructed by your surgeon. ° °ACTIVITY ° °o Increase activity slowly as tolerated, but follow the weight bearing instructions below.   °o No driving for 6 weeks or until further direction given by your physician.  You cannot drive while taking narcotics.  °o No lifting or carrying greater than 10 lbs. until further directed by your surgeon. °o Avoid periods of inactivity such as sitting longer than an hour when not asleep. This helps prevent blood clots.  °o You may return to work once you are authorized by your doctor.  ° ° ° °WEIGHT BEARING  ° °Weight bearing as tolerated with assist device (walker, cane, etc) as directed, use it as long as suggested by your surgeon or therapist, typically at  least 4-6 weeks. ° ° °EXERCISES ° °Results after joint replacement surgery are often greatly improved when you follow the exercise, range of motion and muscle strengthening exercises prescribed by your doctor. Safety measures are also important to protect the joint from further injury. Any time any of these exercises cause you to have increased pain or swelling, decrease what you are doing until you are comfortable again and then slowly increase them. If you have problems or questions, call your caregiver or physical therapist for advice.  ° °Rehabilitation is important following a joint replacement. After just a few days of immobilization, the muscles of the leg can become weakened and shrink (atrophy).  These exercises are designed to build up the tone and strength of the thigh and leg muscles and to improve motion. Often times heat used for twenty to thirty minutes before working out will loosen up your tissues and help with improving the range of motion but do not use heat for the first two weeks following surgery (sometimes heat can increase post-operative swelling).  ° °These exercises can be done on a training (exercise) mat, on the floor, on a table or on a bed. Use whatever works the best and is most comfortable for you.    Use music or television while you are exercising so that the exercises are a pleasant break in your day. This will make your life better with the exercises acting as a break   in your routine that you can look forward to.   Perform all exercises about fifteen times, three times per day or as directed.  You should exercise both the operative leg and the other leg as well. ° °Exercises include: °  °• Quad Sets - Tighten up the muscle on the front of the thigh (Quad) and hold for 5-10 seconds.   °• Straight Leg Raises - With your knee straight (if you were given a brace, keep it on), lift the leg to 60 degrees, hold for 3 seconds, and slowly lower the leg.  Perform this exercise against  resistance later as your leg gets stronger.  °• Leg Slides: Lying on your back, slowly slide your foot toward your buttocks, bending your knee up off the floor (only go as far as is comfortable). Then slowly slide your foot back down until your leg is flat on the floor again.  °• Angel Wings: Lying on your back spread your legs to the side as far apart as you can without causing discomfort.  °• Hamstring Strength:  Lying on your back, push your heel against the floor with your leg straight by tightening up the muscles of your buttocks.  Repeat, but this time bend your knee to a comfortable angle, and push your heel against the floor.  You may put a pillow under the heel to make it more comfortable if necessary.  ° °A rehabilitation program following joint replacement surgery can speed recovery and prevent re-injury in the future due to weakened muscles. Contact your doctor or a physical therapist for more information on knee rehabilitation.  ° ° °CONSTIPATION ° °Constipation is defined medically as fewer than three stools per week and severe constipation as less than one stool per week.  Even if you have a regular bowel pattern at home, your normal regimen is likely to be disrupted due to multiple reasons following surgery.  Combination of anesthesia, postoperative narcotics, change in appetite and fluid intake all can affect your bowels.  ° °YOU MUST use at least one of the following options; they are listed in order of increasing strength to get the job done.  They are all available over the counter, and you may need to use some, POSSIBLY even all of these options:   ° °Drink plenty of fluids (prune juice may be helpful) and high fiber foods °Colace 100 mg by mouth twice a day  °Senokot for constipation as directed and as needed Dulcolax (bisacodyl), take with full glass of water  °Miralax (polyethylene glycol) once or twice a day as needed. ° °If you have tried all these things and are unable to have a bowel  movement in the first 3-4 days after surgery call either your surgeon or your primary doctor.   ° °If you experience loose stools or diarrhea, hold the medications until you stool forms back up.  If your symptoms do not get better within 1 week or if they get worse, check with your doctor.  If you experience "the worst abdominal pain ever" or develop nausea or vomiting, please contact the office immediately for further recommendations for treatment. ° ° °ITCHING:  If you experience itching with your medications, try taking only a single pain pill, or even half a pain pill at a time.  You can also use Benadryl over the counter for itching or also to help with sleep.  ° °TED HOSE STOCKINGS:  Use stockings on both legs until for at least 2 weeks or as   directed by physician office. They may be removed at night for sleeping.  MEDICATIONS:  See your medication summary on the After Visit Summary that nursing will review with you.  You may have some home medications which will be placed on hold until you complete the course of blood thinner medication.  It is important for you to complete the blood thinner medication as prescribed.  PRECAUTIONS:  If you experience chest pain or shortness of breath - call 911 immediately for transfer to the hospital emergency department.   If you develop a fever greater that 101 F, purulent drainage from wound, increased redness or drainage from wound, foul odor from the wound/dressing, or calf pain - CONTACT YOUR SURGEON.                                                   FOLLOW-UP APPOINTMENTS:  If you do not already have a post-op appointment, please call the office for an appointment to be seen by your surgeon.  Guidelines for how soon to be seen are listed in your After Visit Summary, but are typically between 1-4 weeks after surgery.  OTHER INSTRUCTIONS:   Knee Replacement:  Do not place pillow under knee, focus on keeping the knee straight while resting. CPM  instructions: 0-90 degrees, 2 hours in the morning, 2 hours in the afternoon, and 2 hours in the evening. Place foam block, curve side up under heel at all times except when in CPM or when walking.  DO NOT modify, tear, cut, or change the foam block in any way.  MAKE SURE YOU:   Understand these instructions.   Get help right away if you are not doing well or get worse.    Thank you for letting us be a part of your medical care team.  It is a privilege we respect greatly.  We hope these instructions will help you stay on track for a fast and full recovery!   _________________________________________________________________________________________________________________________________________________  Information on my medicine - XARELTO (Rivaroxaban)  This medication education was reviewed with me or my healthcare representative as part of my discharge preparation.  The pharmacist that spoke with me during my hospital stay was:  Myrene Galas, [facility]  Why was Xarelto prescribed for you? Xarelto was prescribed for you to reduce the risk of blood clots forming after orthopedic surgery. The medical term for these abnormal blood clots is venous thromboembolism (VTE).  What do you need to know about xarelto ? Take your Xarelto ONCE DAILY at the same time every day. You may take it either with or without food.  If you have difficulty swallowing the tablet whole, you may crush it and mix in applesauce just prior to taking your dose.  Take Xarelto exactly as prescribed by your doctor and DO NOT stop taking Xarelto without talking to the doctor who prescribed the medication.  Stopping without other VTE prevention medication to take the place of Xarelto may increase your risk of developing a clot.  After discharge, you should have regular check-up appointments with your healthcare provider that is prescribing your Xarelto.    What do you do if you miss a dose? If you miss a dose,  take it as soon as you remember on the same day then continue your regularly scheduled once daily regimen the next day. Do not take  two doses of Xarelto on the same day.   Important Safety Information A possible side effect of Xarelto is bleeding. You should call your healthcare provider right away if you experience any of the following: ? Bleeding from an injury or your nose that does not stop. ? Unusual colored urine (red or dark brown) or unusual colored stools (red or black). ? Unusual bruising for unknown reasons. ? A serious fall or if you hit your head (even if there is no bleeding).  Some medicines may interact with Xarelto and might increase your risk of bleeding while on Xarelto. To help avoid this, consult your healthcare provider or pharmacist prior to using any new prescription or non-prescription medications, including herbals, vitamins, non-steroidal anti-inflammatory drugs (NSAIDs) and supplements.  This website has more information on Xarelto: [email or web address]

## 2015-06-08 NOTE — Progress Notes (Signed)
Occupational Therapy Evaluation Patient Details Name: AJAHNI NAY MRN: [id number] DOB: [birth year] Today's Date: 06/08/2015    History of Present Illness Patient is a 64 y/o male s/p L TKA. PMH includes DM, OSA, CVA, HTN, gout, HLD.   Clinical Impression   PTA, pt independent with ADL and mobility. Pt initially planned to DC home, however, pt expressed desire to rehab at SNF due to limited physical support of wife due to her medical condition. Feel pt would benefit from rehab at SNF to facilitate safe D/C home. Will follow acutely. Notified SW of pt's desire to D/C to SNF.     Follow Up Recommendations  SNF;Supervision/Assistance - 24 hour    Equipment Recommendations  3 in 1 bedside comode;Other (comment) (wide)    Recommendations for Other Services       Precautions / Restrictions Precautions Precautions: Fall Required Braces or Orthoses: Knee Immobilizer - Left Restrictions LLE Weight Bearing: Weight bearing as tolerated      Mobility Bed Mobility Overal bed mobility: Needs Assistance Bed Mobility: Supine to Sit     Supine to sit: Supervision;HOB elevated        Transfers Overall transfer level: Needs assistance Equipment used: Standard walker Transfers: Sit to/from Stand;Stand Pivot Transfers Sit to Stand: Min assist Stand pivot transfers: Min assist - noted buckling L knee in immobilizer.            Balance Overall balance assessment: No apparent balance deficits (not formally assessed)         Standing balance support: During functional activity Standing balance-Leahy Scale: Poor Standing balance comment: heavy reliance on RW                            ADL Overall ADL's : Needs assistance/impaired         Upper Body Bathing: Set up   Lower Body Bathing: Moderate assistance   Upper Body Dressing : Set up   Lower Body Dressing: Moderate assistance;Sit to/from stand   Toilet Transfer: Minimal assistance;Stand-pivot    Toileting- Clothing Manipulation and Hygiene: Mod assistance - pt with difficulty releasing RW with 1 hand and maintaining safe balance       Functional mobility during ADLs: Minimal assistance;Rolling walker;Cueing for safety;Cueing for sequencing General ADL Comments: Pt reports a history of "balance" problems. States his wife can help when not working but that she has arthritis and her ability to help is limited. Only able to take @ 10 steps this pm with L knee buckling in KI. Unable to do straight leg raise. Practice stepping backwards into shower. Pt unable to clear RLE and support weight through RLE at this time. REutrned to give pt water and he discussed desire to explore option of D/C to SNF for rehab.                     Pertinent Vitals/Pain Pain Assessment: 0-10 Pain Score: 5  Pain Location: L knee Pain Descriptors / Indicators: Aching;Sore Pain Intervention(s): Limited activity within patient's tolerance;Monitored during session     Hand Dominance     Extremity/Trunk Assessment Upper Extremity Assessment Upper Extremity Assessment: LUE deficits/detail LUE Deficits / Details: c/o LUE weakness PTA   Lower Extremity Assessment Lower Extremity Assessment: Defer to PT evaluation   Cervical / Trunk Assessment Cervical / Trunk Assessment: Normal   Communication Communication Communication: No difficulties   Cognition Arousal/Alertness: Awake/alert Behavior During Therapy: WFL for tasks assessed/performed Overall Cognitive  Status: Within Functional Limits for tasks assessed                     General Comments       Exercises       Shoulder Instructions      Home Living Family/patient expects to be discharged to:: Private residence Living Arrangements: Spouse/significant other Available Help at Discharge: Family;Available PRN/intermittently Type of Home: House Home Access: Stairs to enter [employer] of Steps: 2 + 2 Entrance  Stairs-Rails: None Home Layout: One level     Bathroom Shower/Tub: Walk-in shower;Door   [employer] Toilet: Standard Bathroom Accessibility: Yes How Accessible: Accessible via walker Home Equipment: Crutches          Prior Functioning/Environment Level of Independence: Independent        Comments: Pt very active. Mowing lawn, cleaning etc.    OT Diagnosis: Generalized weakness;Acute pain   OT Problem List: Decreased strength;Decreased range of motion;Decreased activity tolerance;Impaired balance (sitting and/or standing);Decreased safety awareness;Decreased knowledge of use of DME or AE;Obesity;Pain   OT Treatment/Interventions: Self-care/ADL training;DME and/or AE instruction;Therapeutic activities;Patient/family education;Balance training    OT Goals(Current goals can be found in the care plan section) Acute Rehab OT Goals Patient Stated Goal: to not fall OT Goal Formulation: With patient Time For Goal Achievement: 06/22/15 Potential to Achieve Goals: Good  OT Frequency: Min 2X/week   Barriers to D/C:            Co-evaluation              End of Session Equipment Utilized During Treatment: Gait belt;Rolling walker;Left knee immobilizer Nurse Communication: Mobility status  Activity Tolerance: Patient tolerated treatment well Patient left: in chair;with call bell/phone within reach   Time: 1330-1400 OT Time Calculation (min): 30 min Charges:  OT General Charges $OT Visit: 1 Procedure OT Evaluation $Initial OT Evaluation Tier I: 1 Procedure OT Treatments $Self Care/Home Management : 8-22 mins G-Codes:    Gearline Spilman,HILLARY Jun 10, 2015, 3:21 PM   [facility], OTR/L  [phone] 10-Jun-2015

## 2015-06-09 ENCOUNTER — Encounter (HOSPITAL_COMMUNITY): Payer: Self-pay | Admitting: General Practice

## 2015-06-09 LAB — CBC
HCT: 32.4 % — ABNORMAL LOW (ref 39.0–52.0)
HEMATOCRIT: 34.6 % — AB (ref 39.0–52.0)
Hemoglobin: 11.8 g/dL — ABNORMAL LOW (ref 13.0–17.0)
Hemoglobin: 11.5 g/dL — ABNORMAL LOW (ref 13.0–17.0)
MCH: 32.2 pg (ref 26.0–34.0)
MCH: 33 pg (ref 26.0–34.0)
MCHC: 34.1 g/dL (ref 30.0–36.0)
MCHC: 35.5 g/dL (ref 30.0–36.0)
MCV: 94.5 fL (ref 78.0–100.0)
MCV: 93.1 fL (ref 78.0–100.0)
PLATELETS: 77 10*3/uL — AB (ref 150–400)
Platelets: 77 10*3/uL — ABNORMAL LOW (ref 150–400)
RBC: 3.66 MIL/uL — ABNORMAL LOW (ref 4.22–5.81)
RBC: 3.48 MIL/uL — ABNORMAL LOW (ref 4.22–5.81)
RDW: 12.9 % (ref 11.5–15.5)
RDW: 12.9 % (ref 11.5–15.5)
WBC: 6.6 10*3/uL (ref 4.0–10.5)
WBC: 6.3 10*3/uL (ref 4.0–10.5)

## 2015-06-09 LAB — GLUCOSE, CAPILLARY
GLUCOSE-CAPILLARY: 153 mg/dL — AB (ref 65–99)
Glucose-Capillary: 193 mg/dL — ABNORMAL HIGH (ref 65–99)
Glucose-Capillary: 182 mg/dL — ABNORMAL HIGH (ref 65–99)

## 2015-06-09 MED ORDER — PNEUMOCOCCAL VAC POLYVALENT 25 MCG/0.5ML IJ INJ
0.5000 mL | INJECTION | INTRAMUSCULAR | Status: AC
  Administered 2015-06-10: 0.5 mL via INTRAMUSCULAR
  Filled 2015-06-09: qty 0.5

## 2015-06-09 NOTE — Clinical Social Work Note (Signed)
Clinical Social Work Assessment  Patient Details  Name: Gordon Madden MRN: [id number] Date of Birth: 04-04-1951  Date of referral:  06/09/15               Reason for consult:  Facility Placement                Permission sought to share information with:  Chartered certified accountant granted to share information::  Yes, Verbal Permission Granted  Name::        Agency::   [facility] SNFs)  Relationship::     Contact Information:     Housing/Transportation Living arrangements for the past 2 months:  [place] of Information:  Patient Patient Interpreter Needed:  None Criminal Activity/Legal Involvement Pertinent to Current Situation/Hospitalization:  No - Comment as needed Significant Relationships:  Adult Children Lives with:  Self Do you feel safe going back to the place where you live?  Yes Need for family participation in patient care:  No (Coment)  Care giving concerns:  No caregiver present at time of this assessment.   Social Worker assessment / plan:  CSW assessed patient at beside.  Patient states he is doing well with therapies and would like to entertain the option of going home with home health if he progresses well enough within these next two therapy sessions.  Employment status:  Retired Nurse, adult PT Recommendations:  [place] / Referral to community resources:     Patient/Family's Response to care:  agreeable  Patient/Family's Understanding of and Emotional Response to Diagnosis, Current Treatment, and Prognosis:  Patient is very receptive to recommendations and realistic regarding his level of care at time of discharge. Emotional Assessment Appearance:  Appears stated age Attitude/Demeanor/Rapport:   (appropriate) Affect (typically observed):  Accepting, Adaptable Orientation:  Oriented to Self, Oriented to Place, Oriented to  Time, Oriented to Situation Alcohol /  Substance use:  Not Applicable Psych involvement (Current and /or in the community):  No (Comment)  Discharge Needs  Concerns to be addressed:  No discharge needs identified Readmission within the last 30 days:    Current discharge risk:  None Barriers to Discharge:  No Barriers Identified   Dulcy Fanny, LCSW 06/09/2015, 12:28 PM

## 2015-06-09 NOTE — Clinical Social Work Note (Signed)
CSW attempted assessment.  Pt was working with PT.  Will re-attempt at a later time.  Nonnie Done, LCSW [phone]  Psychiatric [facility] Orthoped[facility] (5N 1-8) Clinical Social Worker

## 2015-06-09 NOTE — Progress Notes (Signed)
Physical Therapy Treatment Patient Details Name: Gordon Madden MRN: [id number] DOB: [birth year] Today's Date: 06/09/2015    History of Present Illness Patient is a 64 y/o male s/p L TKA. PMH includes DM, OSA, CVA, HTN, gout, HLD.    PT Comments    Patient progressing slowly towards PT goals. Pt with returned sensation to LLE as nerve block completely worn off today. Ambulation distance improved with no knee buckling. Pt more confident about going home however still may need ST SNF pending improvement in mobility while in hospital. Still needs to be able to negotiate steps to enter home. Will focus on bed mobility and gait training this PM. Will continue to follow per current POC.   Follow Up Recommendations  Home health PT;Supervision/Assistance - 24 hour     Equipment Recommendations  Rolling walker with 5" wheels (bari)    Recommendations for Other Services       Precautions / Restrictions Precautions Precautions: Fall;Knee Precaution Booklet Issued: No Precaution Comments: Reviewed no pillow under knee. Restrictions Weight Bearing Restrictions: Yes LLE Weight Bearing: Weight bearing as tolerated    Mobility  Bed Mobility Overal bed mobility: Needs Assistance Bed Mobility: Supine to Sit     Supine to sit: Supervision;HOB elevated     General bed mobility comments: Supervision for safety. Use of rails.  Transfers Overall transfer level: Needs assistance Equipment used: Rolling walker (2 wheeled) Transfers: Sit to/from Stand Sit to Stand: Min guard         General transfer comment: Min guard for safety. Good demo of hand placement. No knee buckling.  Ambulation/Gait Ambulation/Gait assistance: Min guard Ambulation Distance (Feet): 120 Feet Assistive device: Rolling walker (2 wheeled) Gait Pattern/deviations: Step-to pattern;Step-through pattern;Decreased stance time - left;Decreased step length - right;Trunk flexed   Gait velocity interpretation: Below  normal speed for age/gender General Gait Details: Cues for heel strike and knee extension during stance phase for quad activation. No knee buckling.   Stairs            Wheelchair Mobility    Modified Rankin (Stroke Patients Only)       Balance Overall balance assessment: Needs assistance Sitting-balance support: Feet supported;No upper extremity supported Sitting balance-Leahy Scale: Good     Standing balance support: During functional activity Standing balance-Leahy Scale: Poor                      Cognition Arousal/Alertness: Awake/alert Behavior During Therapy: WFL for tasks assessed/performed Overall Cognitive Status: Within Functional Limits for tasks assessed                      Exercises Total Joint Exercises Quad Sets: Both;10 reps;Seated Short Arc Quad: AAROM;Left;10 reps;Seated Heel Slides: Left;10 reps;Seated Hip ABduction/ADduction: Left;10 reps;Seated Long Arc Quad: Left;10 reps;Seated Goniometric ROM: 11-89 degrees knee AROM.    General Comments        Pertinent Vitals/Pain Pain Assessment: 0-10 Pain Score: 4  Pain Location: left knee Pain Descriptors / Indicators: Sore Pain Intervention(s): Patient requesting pain meds-RN notified;Monitored during session;Repositioned    Home Living                      Prior Function            PT Goals (current goals can now be found in the care plan section) Progress towards PT goals: Progressing toward goals    Frequency  7X/week    PT Plan Current plan remains  appropriate    Co-evaluation             End of Session Equipment Utilized During Treatment: Gait belt Activity Tolerance: Patient tolerated treatment well Patient left: in chair;with call bell/phone within reach     Time: 1142-1205 PT Time Calculation (min) (ACUTE ONLY): 23 min  Charges:  $Gait Training: 8-22 mins $Therapeutic Exercise: 8-22 mins                    G Codes:      Selah Zelman A  Marletta Bousquet 06/09/2015, 1:18 PM Wray Kearns, [place], DPT [phone]

## 2015-06-09 NOTE — Clinical Social Work Placement (Addendum)
   CLINICAL SOCIAL WORK PLACEMENT  NOTE  Date:  06/09/2015  Patient Details  Name: Gordon Madden MRN: [id number] Date of Birth: [birth year]  Clinical Social Work is seeking post-discharge placement for this patient at the [place] level of care (*CSW will initial, date and re-position this form in  chart as items are completed):  Yes   Patient/family provided with [facility] Work Department's list of facilities offering this level of care within the geographic area requested by the patient (or if unable, by the patient's family).  Yes   Patient/family informed of their freedom to choose among providers that offer the needed level of care, that participate in Medicare, Medicaid or managed care program needed by the patient, have an available bed and are willing to accept the patient.  Yes   Patient/family informed of [place]'s ownership interest in [facility] and [facility], as well as of the fact that they are under no obligation to receive care at these facilities.  PASRR submitted to EDS on 06/09/15     PASRR number received on 06/09/15     Existing PASRR number confirmed on       FL2 transmitted to all facilities in geographic area requested by pt/family on 06/09/15     FL2 transmitted to all facilities within larger geographic area on       Patient informed that his/her managed care company has contracts with or will negotiate with certain facilities, including the following:        Yes   Patient/family informed of bed offers received.  Patient chooses bed at [facility]     Physician recommends and patient chooses bed at  (none)    Patient to be transferred to  [facility] on  06/10/15. (Updated Evette Cristal, MSW, LCSWA, 06/10/15)  Patient to be transferred to facility by  [facility] (Updated Evette Cristal, MSW, LCSWA, 06/10/15)     Patient family notified on  7/15/16of transfer. (Updated Evette Cristal, MSW, LCSWA,  06/10/15) Name of family member notified:   Patient will notify his family (Updated Evette Cristal, MSW, LCSWA, 06/10/15)    PHYSICIAN Please prepare priority discharge summary, including medications     Additional Comment:    _______________________________________________ Dulcy Fanny, LCSW 06/09/2015, 12:30 PM  Jones Broom. Norval Morton, MSW, [place] 06/10/2015 1:07 PM (Updated Evette Cristal, MSW, LCSWA, 06/10/15)

## 2015-06-09 NOTE — Progress Notes (Signed)
Patient advised RT that he didn't need any assistance in applying the CPAP. RT will continue to monitor.

## 2015-06-09 NOTE — Care Management Important Message (Signed)
Important Message  Patient Details  Name: Gordon Madden MRN: [id number] Date of Birth: May 06, 1951   Medicare Important Message Given:  Yes-second notification given    Delorse Lek 06/09/2015, 1:53 PM

## 2015-06-09 NOTE — Progress Notes (Signed)
Physical Therapy Treatment Patient Details Name: Gordon Madden MRN: [id number] DOB: May 29, 1951 Today's Date: 06/09/2015    History of Present Illness Patient is a 64 y/o male s/p L TKA. PMH includes DM, OSA, CVA, HTN, gout, HLD.    PT Comments    Patient progressing slowly with mobility. Unsteady on feet requiring close min guard for safety due to left knee instability. Not sure pt will be able to safely negotiate steps to enter home. Discharge recommendation updated to [facility] SNF after discussion with pt about safety at home and ability of wife to assist. Will continue to follow to maximize independence and mobility.   Follow Up Recommendations  SNF;Supervision/Assistance - 24 hour     Equipment Recommendations  Rolling walker with 5" wheels    Recommendations for Other Services       Precautions / Restrictions Precautions Precautions: Fall;Knee Precaution Booklet Issued: Yes (comment) Precaution Comments: Reviewed no pillow under knee. Restrictions Weight Bearing Restrictions: Yes LLE Weight Bearing: Weight bearing as tolerated    Mobility  Bed Mobility Overal bed mobility: Needs Assistance Bed Mobility: Supine to Sit;Sit to Supine     Supine to sit: Min guard Sit to supine: Min guard   General bed mobility comments: HOB flat, no use of rails. Supervision for safety.  Transfers Overall transfer level: Needs assistance Equipment used: Rolling walker (2 wheeled) (bari RW) Transfers: Sit to/from Stand Sit to Stand: Min guard         General transfer comment: Min guard for safety.   Ambulation/Gait Ambulation/Gait assistance: Min guard Ambulation Distance (Feet): 100 Feet Assistive device: Rolling walker (2 wheeled) Gait Pattern/deviations: Step-through pattern;Step-to pattern;Decreased stance time - left;Decreased step length - right;Trunk flexed   Gait velocity interpretation: Below normal speed for age/gender General Gait Details: Cues for heel strike and  knee extension during stance phase for quad activation. 2 occasiona of left knee instability. Dyspnea present. Cues for short standing rest break.   Stairs            Wheelchair Mobility    Modified Rankin (Stroke Patients Only)       Balance Overall balance assessment: Needs assistance Sitting-balance support: No upper extremity supported;Feet supported Sitting balance-Leahy Scale: Good     Standing balance support: During functional activity Standing balance-Leahy Scale: Poor Standing balance comment: Heavy reliance on RW.                    Cognition Arousal/Alertness: Awake/alert Behavior During Therapy: WFL for tasks assessed/performed Overall Cognitive Status: Within Functional Limits for tasks assessed                      Exercises Total Joint Exercises Quad Sets: Both;10 reps;Supine Heel Slides: Left;10 reps;Supine Hip ABduction/ADduction: Left;10 reps;Supine Long Arc Quad: Left;10 reps;Seated    General Comments        Pertinent Vitals/Pain Pain Assessment: Faces Pain Score: 4  Faces Pain Scale: Hurts little more Pain Location: left knee with mobility Pain Descriptors / Indicators: Sore;Aching Pain Intervention(s): Monitored during session;Repositioned;Premedicated before session    Home Living                      Prior Function            PT Goals (current goals can now be found in the care plan section) Progress towards PT goals: Progressing toward goals    Frequency  7X/week    PT Plan Discharge plan needs  to be updated    Co-evaluation             End of Session Equipment Utilized During Treatment: Gait belt Activity Tolerance: Patient tolerated treatment well Patient left: in bed;with call bell/phone within reach     Time: 6384-5364 PT Time Calculation (min) (ACUTE ONLY): 21 min  Charges:  $Gait Training: 8-22 mins                    G Codes:      Gordon Madden 06/09/2015, 3:02  PM Wray Kearns, [place], DPT [phone]

## 2015-06-09 NOTE — Progress Notes (Signed)
Pt. Stated he doesn't think he will wear cpap tonight due to the congestion he is experiencing. RT informed pt. To notify if he changes his mind and needs assistance.

## 2015-06-09 NOTE — Care Management Note (Signed)
Case Management Note  Patient Details  Name: JAKOBEE BRACKINS MRN: [id number] Date of Birth: [birth year]  Subjective/Objective: 64 yr old male s/p left total knee arthroplasty                  Action/Plan:  Case manager is following. Patient may need SNF.   Expected Discharge Date:                  Expected Discharge Plan:     In-House Referral:  Clinical Social Work  Discharge planning Services  CM Consult  Post Acute Care Choice:    Choice offered to:  Patient  DME Arranged:  Walker rolling, 3-N-1, CPM DME Agency:  [employer]  HH Arranged:  PT [place] Agency:  [facility]  Status of Service:  In process, will continue to follow  Medicare Important Message Given:  Yes-second notification given Date Medicare IM Given:    Medicare IM give by:    Date Additional Medicare IM Given:    Additional Medicare Important Message give by:     If discussed at [employer] of Stay Meetings, dates discussed:    Additional Comments:  Ninfa Meeker, RN 06/09/2015, 3:50 PM

## 2015-06-09 NOTE — Progress Notes (Signed)
OT Cancellation Note  Patient Details Name: Gordon Madden MRN: [id number] DOB: 27-Jun-1951   Cancelled Treatment:    Reason Eval/Treat Not Completed: Patient at procedure or test/ unavailable Pt walking with PT. Will return later if able. [place], OTR/L  209-1980 06/09/2015 06/09/2015, 2:39 PM

## 2015-06-09 NOTE — Progress Notes (Signed)
Patient ID: Gordon Madden, male   DOB: Aug 26, 1951, 64 y.o.   MRN: [id number]     Subjective:  Patient reports pain as mild to moderate.  Patient sitting up in bed and in no acute distress.  Denies any CP or SOB.  Objective:   VITALS:   Filed Vitals:   06/08/15 0631 06/08/15 1400 06/08/15 2300 06/09/15 0629  BP: 105/49 97/51 118/67 116/64  Pulse: 67 67 85 83  Temp: 98.7 F (37.1 C) 98.6 F (37 C) 99.9 F (37.7 C) 98.9 F (37.2 C)  TempSrc: Oral  Oral Oral  Resp: 16 18 16 18   Weight:      SpO2: 92% 93% 94% 96%    ABD soft Sensation intact distally Dorsiflexion/Plantar flexion intact Incision: dressing C/D/I and no drainage Dressing removed and wound good  Lab Results  Component Value Date   WBC 6.6 06/09/2015   HGB 11.8* 06/09/2015   HCT 34.6* 06/09/2015   MCV 94.5 06/09/2015   PLT 77* 06/09/2015   BMET    Component Value Date/Time   NA 135 06/08/2015 0406   NA 137 05/09/2015 1111   K 5.4* 06/08/2015 0406   K 4.7 05/09/2015 1111   CL 101 06/08/2015 0406   CO2 28 06/08/2015 0406   CO2 22 05/09/2015 1111   GLUCOSE 154* 06/08/2015 0406   GLUCOSE 180* 05/09/2015 1111   BUN 21* 06/08/2015 0406   BUN 15.2 05/09/2015 1111   CREATININE 0.95 06/08/2015 0406   CREATININE 0.8 05/09/2015 1111   CALCIUM 8.4* 06/08/2015 0406   CALCIUM 8.7 05/09/2015 1111   GFRNONAA >60 06/08/2015 0406   GFRAA >60 06/08/2015 0406     Assessment/Plan: 2 Days Post-Op   Principal Problem:   Primary localized osteoarthritis of left knee Active Problems:   Thrombocytopenia   Knee osteoarthritis   Advance diet Up with therapy Plan for discharge tomorrow  Thrombocytopenia will hold xerlto until discuss with Dr Gaye Alken Dry dressing PRN   Gordon Madden 06/09/2015, 7:07 AM  Seen and agree.  Spoke with pharmacy, will continue with xarelto, as it is the least impactful on the platelets, and recheck cbc tomorrow, and plan bid cbc after dc for 3 weeks while on xarelto.     Possible snf fri.   [name], MD Cell [phone]

## 2015-06-09 NOTE — Plan of Care (Signed)
Problem: Consults Goal: Diagnosis- Total Joint Replacement Primary Total Knee     

## 2015-06-10 DIAGNOSIS — M1712 Unilateral primary osteoarthritis, left knee: Secondary | ICD-10-CM | POA: Diagnosis not present

## 2015-06-10 LAB — CBC
HCT: 30.7 % — ABNORMAL LOW (ref 39.0–52.0)
Hemoglobin: 10.8 g/dL — ABNORMAL LOW (ref 13.0–17.0)
MCH: 32.9 pg (ref 26.0–34.0)
MCHC: 35.2 g/dL (ref 30.0–36.0)
MCV: 93.6 fL (ref 78.0–100.0)
Platelets: 73 10*3/uL — ABNORMAL LOW (ref 150–400)
RBC: 3.28 MIL/uL — ABNORMAL LOW (ref 4.22–5.81)
RDW: 12.7 % (ref 11.5–15.5)
WBC: 6 10*3/uL (ref 4.0–10.5)

## 2015-06-10 LAB — GLUCOSE, CAPILLARY
GLUCOSE-CAPILLARY: 164 mg/dL — AB (ref 65–99)
Glucose-Capillary: 161 mg/dL — ABNORMAL HIGH (ref 65–99)
Glucose-Capillary: 163 mg/dL — ABNORMAL HIGH (ref 65–99)

## 2015-06-10 LAB — BASIC METABOLIC PANEL
ANION GAP: 7 (ref 5–15)
BUN: 10 mg/dL (ref 6–20)
CO2: 25 mmol/L (ref 22–32)
CREATININE: 0.77 mg/dL (ref 0.61–1.24)
Calcium: 8.3 mg/dL — ABNORMAL LOW (ref 8.9–10.3)
Chloride: 101 mmol/L (ref 101–111)
Glucose, Bld: 179 mg/dL — ABNORMAL HIGH (ref 65–99)
POTASSIUM: 4.1 mmol/L (ref 3.5–5.1)
Sodium: 133 mmol/L — ABNORMAL LOW (ref 135–145)

## 2015-06-10 NOTE — Progress Notes (Signed)
Physical Therapy Treatment Patient Details Name: BLAYDE BACIGALUPI MRN: [id number] DOB: Feb 05, 1951 Today's Date: 12-Jun-2015    History of Present Illness Patient is a 64 y/o male s/p L TKA. PMH includes DM, OSA, CVA, HTN, gout, HLD.    PT Comments    Pt motivated to progress with therapy. Lacks confidence in his physical abilities and is worried about falling. Slight increase in pain after exercise. Continue with current POC.  Follow Up Recommendations  SNF;Supervision/Assistance - 24 hour     Equipment Recommendations  Rolling walker with 5" wheels    Recommendations for Other Services       Precautions / Restrictions Precautions Precautions: Fall;Knee Precaution Comments: Reviewed no pillow under knee. Restrictions Weight Bearing Restrictions: Yes LLE Weight Bearing: Weight bearing as tolerated    Mobility  Bed Mobility               General bed mobility comments: Pt found in chair.  Transfers Overall transfer level: Needs assistance Equipment used: Rolling walker (2 wheeled) Transfers: Sit to/from Stand Sit to Stand: Min guard         General transfer comment: Min guard for safety.   Ambulation/Gait Ambulation/Gait assistance: Min guard Ambulation Distance (Feet): 100 Feet Assistive device: Rolling walker (2 wheeled) Gait Pattern/deviations: Step-to pattern;Decreased step length - right;Decreased stance time - left   Gait velocity interpretation: Below normal speed for age/gender General Gait Details: Putting a lot of weight through UE onto RW. Cues to try to put more weight through LE. Frequent standing rest breaks 2/2 fatigue and heavy breathing.   Stairs            Wheelchair Mobility    Modified Rankin (Stroke Patients Only)       Balance                                    Cognition Arousal/Alertness: Awake/alert Behavior During Therapy: WFL for tasks assessed/performed Overall Cognitive Status: Within Functional  Limits for tasks assessed                      Exercises Total Joint Exercises Quad Sets: AROM;10 reps;Seated;Left Heel Slides: Left;10 reps;Seated;AAROM Hip ABduction/ADduction: AROM;Left;10 reps;Seated Straight Leg Raises: AAROM;Left;10 reps;Seated Long Arc Quad: AAROM;Left;5 reps;Seated    General Comments        Pertinent Vitals/Pain Pain Assessment: 0-10 Pain Score: 5  Pain Location: L knee Pain Descriptors / Indicators: Sore Pain Intervention(s): Monitored during session    Home Living                      Prior Function            PT Goals (current goals can now be found in the care plan section) Progress towards PT goals: Progressing toward goals    Frequency  7X/week    PT Plan Current plan remains appropriate    Co-evaluation             End of Session   Activity Tolerance: Patient tolerated treatment well Patient left: in bed;with call bell/phone within reach     Time: 1100-1129 PT Time Calculation (min) (ACUTE ONLY): 29 min  Charges:                       G CodesAllyn Kenner, SPTA 12-Jun-2015, 11:38 AM

## 2015-06-10 NOTE — Discharge Summary (Signed)
Physician Discharge Summary  Patient ID: Gordon Madden MRN: [id number] DOB/AGE: 66-Oct-1952 66 y.o.  Admit date: 06/07/2015 Discharge date: 06/10/2015  Admission Diagnoses:  Primary localized osteoarthritis of left knee  Discharge Diagnoses:  Principal Problem:   Primary localized osteoarthritis of left knee Active Problems:   Thrombocytopenia   Past Medical History  Diagnosis Date  . BPH (benign prostatic hyperplasia)   . CVA (cerebral infarction)     Renal artery branch occlusion  . Myocardial infarction   . Cardiomyopathy     EF 40%  . Rhinosinusitis   . Diabetes mellitus, type 2   . Colon polyps   . Thrombocytopenia   . OSA (obstructive sleep apnea)     07/25/10 AHI 14.9/hr  . PONV (postoperative nausea and vomiting)   . Hypertension   . Myocardial infarction     pt. states diagnosed in 2005  . Stroke   . Vision decreased   . Hyperlipidemia   . Sleep apnea   . Shortness of breath dyspnea   . History of pneumonia   . History of bronchitis   . Diabetes mellitus without complication     type II  . History of gout   . Arthritis   . Idiopathic thrombocytopenia   . Primary localized osteoarthritis of left knee 06/07/2015    Surgeries: Procedure(s): TOTAL KNEE ARTHROPLASTY on 06/07/2015   Consultants (if any):    Discharged Condition: Improved  Hospital Course: Gordon Madden is an 64 y.o. male who was admitted 06/07/2015 with a diagnosis of Primary localized osteoarthritis of left knee and went to the operating room on 06/07/2015 and underwent the above named procedures.    He was given perioperative antibiotics:  Anti-infectives    Start     Dose/Rate Route Frequency Ordered Stop   06/07/15 2000  ceFAZolin (ANCEF) IVPB 2 g/50 mL premix     2 g 100 mL/hr over 30 Minutes Intravenous Every 6 hours 06/07/15 1947 06/08/15 0605   06/07/15 0600  ceFAZolin (ANCEF) 3 g in dextrose 5 % 50 mL IVPB     3 g 160 mL/hr over 30 Minutes Intravenous On call to O.R. 06/06/15  1354 06/07/15 1047    .  He was given sequential compression devices, early ambulation, and xarelto for DVT prophylaxis.  He has known thrombocytopenia and was monitored closes for this, platelets remained above 70k.  Will recheck platelets 2x/wk after discharge for the 3 weeks he is on xarelto.    He benefited maximally from the hospital stay and there were no complications.    Recent vital signs:  Filed Vitals:   06/09/15 2134  BP: 123/57  Pulse: 83  Temp: 100.7 F (38.2 C)  Resp: 16    Recent laboratory studies:  Lab Results  Component Value Date   HGB 10.8* 06/10/2015   HGB 11.5* 06/09/2015   HGB 11.8* 06/09/2015   Lab Results  Component Value Date   WBC 6.0 06/10/2015   PLT 73* 06/10/2015   Lab Results  Component Value Date   INR 1.04 01/30/2010   Lab Results  Component Value Date   NA 135 06/08/2015   K 5.4* 06/08/2015   CL 101 06/08/2015   CO2 28 06/08/2015   BUN 21* 06/08/2015   CREATININE 0.95 06/08/2015   GLUCOSE 154* 06/08/2015    Discharge Medications:     Medication List    STOP taking these medications        acetaminophen 325 MG tablet  Commonly known  as:  TYLENOL     clopidogrel 75 MG tablet  Commonly known as:  PLAVIX      TAKE these medications        aspirin EC 81 MG tablet  Take 81 mg by mouth daily.     baclofen 10 MG tablet  Commonly known as:  LIORESAL  Take 1 tablet (10 mg total) by mouth 3 (three) times daily. As needed for muscle spasm     carvedilol 6.25 MG tablet  Commonly known as:  COREG  Take 6.25 mg by mouth daily.     CO Q 10 PO  Take 1 tablet by mouth daily.     fexofenadine 180 MG tablet  Commonly known as:  ALLEGRA  Take 180 mg by mouth daily.     finasteride 5 MG tablet  Commonly known as:  PROSCAR  Take 5 mg by mouth daily.     FISH OIL PO  Take 1 tablet by mouth daily.     folic acid 150 MCG tablet  Commonly known as:  FOLVITE  Take 400 mcg by mouth daily.     lisinopril 10 MG tablet   Commonly known as:  PRINIVIL,ZESTRIL  Take 10 mg by mouth daily.     metFORMIN 500 MG tablet  Commonly known as:  GLUCOPHAGE  Take 500 mg by mouth daily with breakfast.     ondansetron 4 MG tablet  Commonly known as:  ZOFRAN  Take 1 tablet (4 mg total) by mouth every 8 (eight) hours as needed for nausea or vomiting.     oxyCODONE-acetaminophen 10-325 MG per tablet  Commonly known as:  PERCOCET  Take 1-2 tablets by mouth every 6 (six) hours as needed for pain. MAXIMUM TOTAL ACETAMINOPHEN DOSE IS 4000 MG PER DAY     pravastatin 40 MG tablet  Commonly known as:  PRAVACHOL  Take 40 mg by mouth daily.     rivaroxaban 10 MG Tabs tablet  Commonly known as:  XARELTO  Take 1 tablet (10 mg total) by mouth daily.     sennosides-docusate sodium 8.6-50 MG tablet  Commonly known as:  SENOKOT-S  Take 2 tablets by mouth daily.     tamsulosin 0.4 MG Caps capsule  Commonly known as:  FLOMAX  Take 0.4 mg by mouth daily.     temazepam 15 MG capsule  Commonly known as:  RESTORIL  Take 1 capsule (15 mg total) by mouth at bedtime as needed for sleep.        Diagnostic Studies: Dg Knee Left Port  06/07/2015   CLINICAL DATA:  Post left total knee arthroplasty for DJD.  EXAM: PORTABLE LEFT KNEE - 1-2 VIEW  COMPARISON:  None.  FINDINGS: Patient has undergone left total knee arthroplasty. Hardware appears well seated. No acute fracture or subluxation. Soft tissue in joint space gas noted following surgery.  IMPRESSION: Status post total knee arthroplasty.  No adverse features.   Electronically Signed   By: Nolon Nations M.D.   On: 06/07/2015 14:59    Disposition: SNF        Follow-up Information    Follow up with Johnny Bridge, MD. Schedule an appointment as soon as possible for a visit in 2 weeks.   Specialty:  Orthopedic Surgery   Contact information:   [place] [address] [phone]        Signed: Johnny Bridge 06/10/2015, 5:51  AM

## 2015-06-10 NOTE — Progress Notes (Signed)
Physical Therapy Treatment Patient Details Name: Gordon Madden MRN: [id number] DOB: 21-Jul-1951 Today's Date: 06/10/2015    History of Present Illness Patient is a 64 y/o male s/p L TKA. PMH includes DM, OSA, CVA, HTN, gout, HLD.    PT Comments    Pt reports pain with initial movement but subsides after repetive motions. Pt is motivated to progress with rehab. Plan to discharge this afternoon to SNF for continued rehab to increase functional independence.  Follow Up Recommendations  SNF;Supervision/Assistance - 24 hour     Equipment Recommendations  Rolling walker with 5" wheels    Recommendations for Other Services       Precautions / Restrictions Precautions Precautions: Fall;Knee Precaution Comments: Reviewed no pillow under knee. Restrictions LLE Weight Bearing: Weight bearing as tolerated    Mobility  Bed Mobility               General bed mobility comments: Pt found in chair.  Transfers Overall transfer level: Needs assistance Equipment used: Rolling walker (2 wheeled) Transfers: Sit to/from Stand Sit to Stand: Min guard         General transfer comment: Min guard for safety.   Ambulation/Gait Ambulation/Gait assistance: Min guard Ambulation Distance (Feet): 110 Feet Assistive device: Rolling walker (2 wheeled) Gait Pattern/deviations: Step-to pattern;Decreased step length - right;Decreased stance time - left   Gait velocity interpretation: Below normal speed for age/gender General Gait Details: Still putting an excessive amount of weight through UE onto RW. But patient has progressed by putting a little more weight onto LLE. Several standing rest breaks needed during ambulation 2/2 fatigue. Dyspnea present.   Stairs            Wheelchair Mobility    Modified Rankin (Stroke Patients Only)       Balance                                    Cognition Arousal/Alertness: Awake/alert Behavior During Therapy: WFL for tasks  assessed/performed Overall Cognitive Status: Within Functional Limits for tasks assessed                      Exercises Total Joint Exercises Quad Sets: AROM;10 reps;Seated;Left Heel Slides: Left;10 reps;Seated;AAROM Hip ABduction/ADduction: AROM;Left;10 reps;Seated Straight Leg Raises: AAROM;Left;10 reps;Seated Long Arc Quad: AAROM;Left;5 reps;Seated    General Comments        Pertinent Vitals/Pain Pain Assessment: 0-10 Pain Score: 5  Pain Location: L knee Pain Descriptors / Indicators: Sore Pain Intervention(s): Monitored during session    Home Living                      Prior Function            PT Goals (current goals can now be found in the care plan section) Progress towards PT goals: Progressing toward goals    Frequency  7X/week    PT Plan Current plan remains appropriate    Co-evaluation             End of Session   Activity Tolerance: Patient tolerated treatment well Patient left: in bed;with call bell/phone within reach     Time: 1352-1436 PT Time Calculation (min) (ACUTE ONLY): 44 min  Charges:  $Gait Training: 23-37 mins $Therapeutic Exercise: 8-22 mins                    G  Codes:      Allyn Kenner, SPTA 06/10/2015, 2:54 PM

## 2015-06-10 NOTE — Clinical Social Work Note (Signed)
Patient to be d/c'ed today to [facility].   Patient and family agreeable to plans will transport via ems RN to call report.  Evette Cristal, MSW, [place]

## 2015-06-10 NOTE — Progress Notes (Signed)
Pt's CBG was 164 at 2140.

## 2015-06-14 ENCOUNTER — Non-Acute Institutional Stay: Payer: Medicare PPO | Admitting: Internal Medicine

## 2015-06-14 DIAGNOSIS — M1712 Unilateral primary osteoarthritis, left knee: Secondary | ICD-10-CM | POA: Diagnosis not present

## 2015-06-14 DIAGNOSIS — K5900 Constipation, unspecified: Secondary | ICD-10-CM | POA: Diagnosis not present

## 2015-06-14 DIAGNOSIS — D696 Thrombocytopenia, unspecified: Secondary | ICD-10-CM | POA: Diagnosis not present

## 2015-06-14 DIAGNOSIS — N400 Enlarged prostate without lower urinary tract symptoms: Secondary | ICD-10-CM

## 2015-06-14 DIAGNOSIS — I2510 Atherosclerotic heart disease of native coronary artery without angina pectoris: Secondary | ICD-10-CM

## 2015-06-14 DIAGNOSIS — E785 Hyperlipidemia, unspecified: Secondary | ICD-10-CM

## 2015-06-14 DIAGNOSIS — I10 Essential (primary) hypertension: Secondary | ICD-10-CM | POA: Diagnosis not present

## 2015-06-14 DIAGNOSIS — I6789 Other cerebrovascular disease: Secondary | ICD-10-CM | POA: Diagnosis not present

## 2015-06-14 DIAGNOSIS — D62 Acute posthemorrhagic anemia: Secondary | ICD-10-CM | POA: Diagnosis not present

## 2015-06-14 NOTE — Progress Notes (Signed)
Patient ID: Gordon Madden, male   DOB: 1951/03/22, 64 y.o.   MRN: [id number]     Facility: [facility]    PCP: Osborne Casco, MD  Code Status: full code  Allergies  Allergen Reactions  . Plavix [Clopidogrel]     thrombocytopenia    Chief Complaint  Patient presents with  . New Admit To SNF     HPI:  64 y.o. patient is here for short term rehabilitation post hospital admission from 06/07/15-06/10/15 with left knee OA. He underwent left total knee arthroplasty. He is seen in his room today. His pain is under control with current pain regimen. He has been constipated. He denies any other concerns. He has PMH of HTN, CVA, CAD, DM and ITP among others. He has been working with therapy team.  Review of Systems:  Constitutional: Negative for fever, chills, diaphoresis.  HENT: Negative for headache, congestion, nasal discharge Eyes: Negative for eye pain, blurred vision, double vision and discharge.  Respiratory: Negative for cough, shortness of breath and wheezing.   Cardiovascular: Negative for chest pain, palpitations, leg swelling.  Gastrointestinal: Negative for heartburn, nausea, vomiting, abdominal pain Genitourinary: Negative for dysuria and flank pain.  Musculoskeletal: Negative for back pain, falls Skin: Negative for itching, rash.  Neurological: Negative for dizziness, tingling, focal weakness Psychiatric/Behavioral: Negative for depression.    Past Medical History  Diagnosis Date  . BPH (benign prostatic hyperplasia)   . CVA (cerebral infarction)     Renal artery branch occlusion  . Myocardial infarction   . Cardiomyopathy     EF 40%  . Rhinosinusitis   . Diabetes mellitus, type 2   . Colon polyps   . Thrombocytopenia   . OSA (obstructive sleep apnea)     07/25/10 AHI 14.9/hr  . PONV (postoperative nausea and vomiting)   . Hypertension   . Myocardial infarction     pt. states diagnosed in 2005  . Stroke   . Vision decreased    . Hyperlipidemia   . Sleep apnea   . Shortness of breath dyspnea   . History of pneumonia   . History of bronchitis   . Diabetes mellitus without complication     type II  . History of gout   . Arthritis   . Idiopathic thrombocytopenia   . Primary localized osteoarthritis of left knee 06/07/2015   Past Surgical History  Procedure Laterality Date  . Knee cartilage surgery      left  . Umbilical hernia repair    . Sinus irrigation    . Meniscus repair Left   . Nasal sinus surgery    . Cardiac catheterization  2005    no stents  . Cyst excision      chest and back  . Hernia repair      umbilical  . Total knee arthroplasty Left 06/07/2015    Procedure: TOTAL KNEE ARTHROPLASTY;  Surgeon: [name], MD;  Location: [place];  Service: Orthopedics;  Laterality: Left;   Social History:   reports that he has been smoking Cigars.  He has never used smokeless tobacco. He reports that he drinks alcohol. He reports that he does not use illicit drugs.  Family History  Problem Relation Age of Onset  . Colon cancer Neg Hx   . Hypertension Father     Medications:   Medication List       This list is accurate as of: 06/14/15  3:50 PM.  Always use your most recent  med list.               aspirin EC 81 MG tablet  Take 81 mg by mouth daily.     baclofen 10 MG tablet  Commonly known as:  LIORESAL  Take 1 tablet (10 mg total) by mouth 3 (three) times daily. As needed for muscle spasm     carvedilol 6.25 MG tablet  Commonly known as:  COREG  Take 6.25 mg by mouth daily.     CO Q 10 PO  Take 1 tablet by mouth daily.     fexofenadine 180 MG tablet  Commonly known as:  ALLEGRA  Take 180 mg by mouth daily.     finasteride 5 MG tablet  Commonly known as:  PROSCAR  Take 5 mg by mouth daily.     FISH OIL PO  Take 1 tablet by mouth daily.     folic acid 280 MCG tablet  Commonly known as:  FOLVITE  Take 400 mcg by mouth daily.     lisinopril 10 MG tablet  Commonly known  as:  PRINIVIL,ZESTRIL  Take 10 mg by mouth daily.     metFORMIN 500 MG tablet  Commonly known as:  GLUCOPHAGE  Take 500 mg by mouth daily with breakfast.     ondansetron 4 MG tablet  Commonly known as:  ZOFRAN  Take 1 tablet (4 mg total) by mouth every 8 (eight) hours as needed for nausea or vomiting.     oxyCODONE-acetaminophen 10-325 MG per tablet  Commonly known as:  PERCOCET  Take 1-2 tablets by mouth every 6 (six) hours as needed for pain. MAXIMUM TOTAL ACETAMINOPHEN DOSE IS 4000 MG PER DAY     pravastatin 40 MG tablet  Commonly known as:  PRAVACHOL  Take 40 mg by mouth daily.     rivaroxaban 10 MG Tabs tablet  Commonly known as:  XARELTO  Take 1 tablet (10 mg total) by mouth daily.     sennosides-docusate sodium 8.6-50 MG tablet  Commonly known as:  SENOKOT-S  Take 2 tablets by mouth daily.     tamsulosin 0.4 MG Caps capsule  Commonly known as:  FLOMAX  Take 0.4 mg by mouth daily.     temazepam 15 MG capsule  Commonly known as:  RESTORIL  Take 1 capsule (15 mg total) by mouth at bedtime as needed for sleep.         Physical Exam:  Filed Vitals:   06/14/15 1549  BP: 114/72  Pulse: 84  Temp: 97.2 F (36.2 C)  Resp: 18  SpO2: 95%    General- elderly obese male in no acute distress Head- normocephalic, atraumatic Throat- moist mucus membrane Eyes- PERRLA, EOMI, no pallor, no icterus, no discharge, normal conjunctiva, normal sclera Neck- no cervical lymphadenopathy Cardiovascular- normal s1,s2, no murmurs, palpable dorsalis pedis, 1+ left leg edema Respiratory- bilateral clear to auscultation, no wheeze, no rhonchi, no crackles, no use of accessory muscles Abdomen- bowel sounds present, soft, non tender Musculoskeletal- able to move all 4 extremities, left knee ROM restricted with pain, using walker Neurological- no focal deficit, alert and oriented to person, place and time Skin- warm and dry, aquacel dressing to left knee incision clean and dry,  bruising around the thigh area and foot on left side Psychiatry- normal mood and affect    Labs reviewed: Basic Metabolic Panel:  Recent Labs  05/25/15 0956 06/08/15 0406 06/10/15 0451  NA 137 135 133*  K 4.9 5.4* 4.1  CL  101 101 101  CO2 28 28 25   GLUCOSE 156* 154* 179*  BUN 16 21* 10  CREATININE 0.81 0.95 0.77  CALCIUM 9.0 8.4* 8.3*   Liver Function Tests:  Recent Labs  05/09/15 1111  AST 16  ALT 17  ALKPHOS 46  BILITOT 0.82  PROT 6.1*  ALBUMIN 3.7   No results for input(s): LIPASE, AMYLASE in the last 8760 hours. No results for input(s): AMMONIA in the last 8760 hours. CBC:  Recent Labs  05/09/15 1111  06/09/15 0406 06/09/15 1108 06/10/15 0451  WBC 5.9  < > 6.6 6.3 6.0  NEUTROABS 3.8  --   --   --   --   HGB 16.3  < > 11.8* 11.5* 10.8*  HCT 47.3  < > 34.6* 32.4* 30.7*  MCV 95.3  < > 94.5 93.1 93.6  PLT 96 Large platelets present*  < > 77* 77* 73*  < > = values in this interval not displayed.  CBG:  Recent Labs  06/09/15 2137 06/10/15 0632 06/10/15 1214  GLUCAP 164* 161* 163*    Assessment/Plan  Left knee OA S/p left TKA. Will have him work with physical therapy and occupational therapy team to help with gait training and muscle strengthening exercises.fall precautions. Skin care. Encourage to be out of bed. Continue oxycodone 10/325 mg 1-2 tab q6h prn pain with baclofen 10 mg tid prn muscle spasm. Continue xarelto for dvt prophylaxis. Has f/u with orthopedics  CAD Remains chest pain free. 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 Currently on xarelto, pt concerned about further drop in platelet count. Pending cbc from today. If platelet count < 70,000, will hold xarelto and consult with his hematologist. No report of bleeding at present  Constipation Currently on senokot s 2 tab daily. Add miralax 17 g daily for now and reassess. Hydration encouraged  Blood loss anemia Post op, monitor  h&h  Stroke Has old cva. Continue aspirin 81 mg daily  HTN Stable bp reading, continue coreg and lisinopril as above and monitor bp  BPH  Nocturia present, no worsening of symptom. continue proscar and flomax for now, monitor clinically.   HLD Continue pravachol 40 mg daily with fish oil and coq supplement  DM type 2 Monitor cbg. Continue glucophage 500 mg daily with aspirin, statin and lisinopril.   Goals of care: short term rehabilitation   Labs/tests ordered: pending cbc  Family/ staff Communication: reviewed care plan with patient and nursing supervisor    Blanchie Serve, MD  [facility] [phone] (Monday-Friday 8 am - 5 pm) [phone] (afterhours)

## 2015-06-15 ENCOUNTER — Non-Acute Institutional Stay: Payer: Medicare PPO | Admitting: Nurse Practitioner

## 2015-06-15 DIAGNOSIS — K5900 Constipation, unspecified: Secondary | ICD-10-CM | POA: Diagnosis not present

## 2015-06-15 DIAGNOSIS — D62 Acute posthemorrhagic anemia: Secondary | ICD-10-CM | POA: Diagnosis not present

## 2015-06-15 DIAGNOSIS — I2510 Atherosclerotic heart disease of native coronary artery without angina pectoris: Secondary | ICD-10-CM

## 2015-06-15 DIAGNOSIS — M1712 Unilateral primary osteoarthritis, left knee: Secondary | ICD-10-CM

## 2015-06-15 DIAGNOSIS — I10 Essential (primary) hypertension: Secondary | ICD-10-CM

## 2015-06-15 DIAGNOSIS — N400 Enlarged prostate without lower urinary tract symptoms: Secondary | ICD-10-CM

## 2015-06-15 DIAGNOSIS — D696 Thrombocytopenia, unspecified: Secondary | ICD-10-CM | POA: Diagnosis not present

## 2015-06-15 DIAGNOSIS — G4733 Obstructive sleep apnea (adult) (pediatric): Secondary | ICD-10-CM

## 2015-06-15 NOTE — Progress Notes (Signed)
Patient ID: Gordon Madden, male   DOB: [birth year], 64 y.o.   MRN: [id number]    Nursing Home Location:  [place] of Service: SNF (69)  PCP: Osborne Casco, MD  Allergies  Allergen Reactions  . Plavix [Clopidogrel]     thrombocytopenia    Chief Complaint  Patient presents with  . Discharge Note    HPI:  Patient is a 64 y.o. male seen today at [facility] for discharge home. Pt with a pmh of HTN, CVA, CAD, DM, OA, and ITP. Pt is here for short term rehabilitation post hospital admission from 06/07/15-06/10/15 with left knee OA. He underwent left total knee arthroplasty. Pain controlled with current medications. Has had some issues with constipation. Started on miralax.  Patient currently doing well with therapy, now stable to discharge home with home health.   Review of Systems:  Review of Systems  Constitutional: Negative for activity change, appetite change, fatigue and unexpected weight change.  HENT: Negative for congestion and hearing loss.   Eyes: Negative.   Respiratory: Negative for cough and shortness of breath.   Cardiovascular: Negative for chest pain, palpitations and leg swelling.  Gastrointestinal: Positive for constipation. Negative for abdominal pain and diarrhea.  Genitourinary: Negative for dysuria and difficulty urinating.  Musculoskeletal:       Pain controlled with medications  Skin: Negative for color change and wound.  Neurological: Negative for dizziness and weakness.  Psychiatric/Behavioral: Negative for behavioral problems, confusion and agitation.    Past Medical History  Diagnosis Date  . BPH (benign prostatic hyperplasia)   . CVA (cerebral infarction)     Renal artery branch occlusion  . Myocardial infarction   . Cardiomyopathy     EF 40%  . Rhinosinusitis   . Diabetes mellitus, type 2   . Colon polyps   . Thrombocytopenia   . OSA (obstructive sleep apnea)     07/25/10 AHI 14.9/hr  .  PONV (postoperative nausea and vomiting)   . Hypertension   . Myocardial infarction     pt. states diagnosed in 2005  . Stroke   . Vision decreased   . Hyperlipidemia   . Sleep apnea   . Shortness of breath dyspnea   . History of pneumonia   . History of bronchitis   . Diabetes mellitus without complication     type II  . History of gout   . Arthritis   . Idiopathic thrombocytopenia   . Primary localized osteoarthritis of left knee 06/07/2015   Past Surgical History  Procedure Laterality Date  . Knee cartilage surgery      left  . Umbilical hernia repair    . Sinus irrigation    . Meniscus repair Left   . Nasal sinus surgery    . Cardiac catheterization  2005    no stents  . Cyst excision      chest and back  . Hernia repair      umbilical  . Total knee arthroplasty Left 06/07/2015    Procedure: TOTAL KNEE ARTHROPLASTY;  Surgeon: [name], MD;  Location: [place];  Service: Orthopedics;  Laterality: Left;   Social History:   reports that he has been smoking Cigars.  He has never used smokeless tobacco. He reports that he drinks alcohol. He reports that he does not use illicit drugs.  Family History  Problem Relation Age of Onset  . Colon cancer Neg Hx   . Hypertension Father  Medications: Patient's Medications  New Prescriptions   No medications on file  Previous Medications   ASPIRIN EC 81 MG TABLET    Take 81 mg by mouth daily.   BACLOFEN (LIORESAL) 10 MG TABLET    Take 1 tablet (10 mg total) by mouth 3 (three) times daily. As needed for muscle spasm   CARVEDILOL (COREG) 6.25 MG TABLET    Take 6.25 mg by mouth daily.   COENZYME Q10 (CO Q 10 PO)    Take 1 tablet by mouth daily.   FEXOFENADINE (ALLEGRA) 180 MG TABLET    Take 180 mg by mouth daily.   FINASTERIDE (PROSCAR) 5 MG TABLET    Take 5 mg by mouth daily.   FOLIC ACID (FOLVITE) 765 MCG TABLET    Take 400 mcg by mouth daily.   LISINOPRIL (PRINIVIL,ZESTRIL) 10 MG TABLET    Take 10 mg by mouth daily.    METFORMIN (GLUCOPHAGE) 500 MG TABLET    Take 500 mg by mouth daily with breakfast.   OMEGA-3 FATTY ACIDS (FISH OIL PO)    Take 1 tablet by mouth daily.   ONDANSETRON (ZOFRAN) 4 MG TABLET    Take 1 tablet (4 mg total) by mouth every 8 (eight) hours as needed for nausea or vomiting.   OXYCODONE-ACETAMINOPHEN (PERCOCET) 10-325 MG PER TABLET    Take 1-2 tablets by mouth every 6 (six) hours as needed for pain. MAXIMUM TOTAL ACETAMINOPHEN DOSE IS 4000 MG PER DAY   PRAVASTATIN (PRAVACHOL) 40 MG TABLET    Take 40 mg by mouth daily.   RIVAROXABAN (XARELTO) 10 MG TABS TABLET    Take 1 tablet (10 mg total) by mouth daily.   SENNOSIDES-DOCUSATE SODIUM (SENOKOT-S) 8.6-50 MG TABLET    Take 2 tablets by mouth daily.   TAMSULOSIN (FLOMAX) 0.4 MG CAPS CAPSULE    Take 0.4 mg by mouth daily.   TEMAZEPAM (RESTORIL) 15 MG CAPSULE    Take 1 capsule (15 mg total) by mouth at bedtime as needed for sleep.  Modified Medications   No medications on file  Discontinued Medications   No medications on file     Physical Exam: Filed Vitals:   06/15/15 1537  BP: 131/84  Pulse: 84  Temp: 97.1 F (36.2 C)  Resp: 20    Physical Exam  Constitutional: He is oriented to person, place, and time. He appears well-developed and well-nourished. No distress.  HENT:  Head: Normocephalic and atraumatic.  Mouth/Throat: Oropharynx is clear and moist. No oropharyngeal exudate.  Eyes: Conjunctivae and EOM are normal. Pupils are equal, round, and reactive to light.  Neck: Normal range of motion. Neck supple.  Cardiovascular: Normal rate, regular rhythm and normal heart sounds.   Pulmonary/Chest: Effort normal and breath sounds normal.  Abdominal: Soft. Bowel sounds are normal.  Musculoskeletal: He exhibits no edema or tenderness.  Left knee with duoderm dressing from ortho, no erythema to surrounding skin    Neurological: He is alert and oriented to person, place, and time.  Skin: Skin is warm and dry. He is not diaphoretic.    Psychiatric: He has a normal mood and affect.    Labs reviewed: Basic Metabolic Panel:  Recent Labs  05/25/15 0956 06/08/15 0406 06/10/15 0451  NA 137 135 133*  K 4.9 5.4* 4.1  CL 101 101 101  CO2 28 28 25   GLUCOSE 156* 154* 179*  BUN 16 21* 10  CREATININE 0.81 0.95 0.77  CALCIUM 9.0 8.4* 8.3*   Liver Function Tests:  Recent Labs  05/09/15 1111  AST 16  ALT 17  ALKPHOS 46  BILITOT 0.82  PROT 6.1*  ALBUMIN 3.7   No results for input(s): LIPASE, AMYLASE in the last 8760 hours. No results for input(s): AMMONIA in the last 8760 hours. CBC:  Recent Labs  05/09/15 1111  06/09/15 0406 06/09/15 1108 06/10/15 0451  WBC 5.9  < > 6.6 6.3 6.0  NEUTROABS 3.8  --   --   --   --   HGB 16.3  < > 11.8* 11.5* 10.8*  HCT 47.3  < > 34.6* 32.4* 30.7*  MCV 95.3  < > 94.5 93.1 93.6  PLT 96 Large platelets present*  < > 77* 77* 73*  < > = values in this interval not displayed. TSH: No results for input(s): TSH in the last 8760 hours. A1C: Lab Results  Component Value Date   HGBA1C 7.1* 05/25/2015   Lipid Panel: No results for input(s): CHOL, HDL, LDLCALC, TRIG, CHOLHDL, LDLDIRECT in the last 8760 hours.  Assessment/Plan 1. Essential hypertension Stable, conts on coreg and lisinopril   2. Obstructive sleep apnea conts with CPAP  3. Primary localized osteoarthritis of left knee S/p left TKA. Pain controlled on current regimen; Continue oxycodone 10/325 mg 1-2 tab q6h prn pain with baclofen 10 mg tid prn muscle spasm. Continue xarelto for dvt prophylaxis.  Keep follow up with orthopedics  4. Thrombocytopenia Stable, followed by hematology   5. Acute blood loss anemia Noted on post op- stable, will need going outpt monitoring of hgb  6. Constipation, unspecified constipation type Noted to have constipation, no abdominal pain or distension. positive for flatulence and bowel sounds. miralax daily recently added and will need to continue   7. Atherosclerosis of  native coronary artery of native heart without angina pectoris Stable, without chest pains. Conts on coreg 6.25 mg daily, lisinopril 10 mg daily, statin and aspirin   8. BPH (benign prostatic hyperplasia) Remains stable, conts on proscar and flomax   pt is stable for discharge-will need PT/OT per home health. DME needed FWW and 3N1. Rx written.  will need to follow up with PCP within 2 weeks.    Carlos American. Harle Battiest  [facility] [phone] 8 am - 5 pm) [phone] (after hours)

## 2015-06-18 DIAGNOSIS — D649 Anemia, unspecified: Secondary | ICD-10-CM | POA: Diagnosis not present

## 2015-06-18 DIAGNOSIS — Z96652 Presence of left artificial knee joint: Secondary | ICD-10-CM | POA: Diagnosis not present

## 2015-06-18 DIAGNOSIS — E119 Type 2 diabetes mellitus without complications: Secondary | ICD-10-CM | POA: Diagnosis not present

## 2015-06-18 DIAGNOSIS — Z471 Aftercare following joint replacement surgery: Secondary | ICD-10-CM | POA: Diagnosis not present

## 2015-06-18 DIAGNOSIS — I4891 Unspecified atrial fibrillation: Secondary | ICD-10-CM | POA: Diagnosis not present

## 2015-08-17 ENCOUNTER — Encounter: Payer: Self-pay | Admitting: Interventional Cardiology

## 2015-08-17 ENCOUNTER — Ambulatory Visit (INDEPENDENT_AMBULATORY_CARE_PROVIDER_SITE_OTHER): Payer: Medicare PPO | Admitting: Interventional Cardiology

## 2015-08-17 VITALS — BP 106/74 | HR 68 | Ht 71.5 in | Wt 317.4 lb

## 2015-08-17 DIAGNOSIS — I252 Old myocardial infarction: Secondary | ICD-10-CM | POA: Diagnosis not present

## 2015-08-17 DIAGNOSIS — I059 Rheumatic mitral valve disease, unspecified: Secondary | ICD-10-CM

## 2015-08-17 DIAGNOSIS — Z720 Tobacco use: Secondary | ICD-10-CM | POA: Diagnosis not present

## 2015-08-17 DIAGNOSIS — E669 Obesity, unspecified: Secondary | ICD-10-CM

## 2015-08-17 DIAGNOSIS — I10 Essential (primary) hypertension: Secondary | ICD-10-CM

## 2015-08-17 DIAGNOSIS — I2510 Atherosclerotic heart disease of native coronary artery without angina pectoris: Secondary | ICD-10-CM

## 2015-08-17 DIAGNOSIS — F17200 Nicotine dependence, unspecified, uncomplicated: Secondary | ICD-10-CM

## 2015-08-17 NOTE — Patient Instructions (Signed)
Medication Instructions:   Your physician recommends that you continue on your current medications as directed. Please refer to the Current Medication list given to you today.  Labwork:  NONE ORDER TODAY   Testing/Procedures:  NONE ORDER TODAY   Follow-Up:  Your physician wants you to follow-up in: [place] will receive a reminder letter in the mail two months in advance. If you don't receive a letter, please call our office to schedule the follow-up appointment.    Any Other Special Instructions Will Be Listed Below (If Applicable).

## 2015-08-17 NOTE — Progress Notes (Signed)
Patient ID: Gordon Madden, male   DOB: 22-Apr-1951, 64 y.o.   MRN: [id number]     Cardiology Office Note   Date:  08/17/2015   ID:  Gordon Madden, DOB Sep 05, 1951, MRN [id number]  PCP:  Osborne Casco, MD    Chief Complaint  Patient presents with  . Follow-up   CAD, mitral regurgitation  Wt Readings from Last 3 Encounters:  08/17/15 317 lb 6.4 oz (143.972 kg)  06/09/15 330 lb 3.2 oz (149.778 kg)  05/25/15 242 lb 4.8 oz (109.907 kg)       History of Present Illness: Gordon Madden is a 64 y.o. male   with CAD. He takes CoQ10 and restarted pravastatin. Chronic joint pains. 25 minute walk daily after recent left knee replacement in July 2016, had previously stopped this recently do to knee pain. Started on metformin with increase in HbA1C.  Most recent A1C is < 7. CAD/ASCVD:  Denies : Chest pain.  Dizziness.  Dyspnea on exertion.  Leg edema.  Nitroglycerin.  Palpitations resolved.  Syncope.    Had 2 episodes of right upper quadrant pain in 2015. THought to be gall bladder attacks. Avoiding fatty foods ECG was negative. He had a stress test which did not show any new ischemia.  No repeat symptoms.   Went on to [employer].   Platelets have decreased- chronic thrombocytopenia, better after TKR. This will be recheked by PMD.  No cardiac issues with recent TKR. Looking ahead, he may ned a right TKR as well.      Past Medical History  Diagnosis Date  . BPH (benign prostatic hyperplasia)   . CVA (cerebral infarction)     Renal artery branch occlusion  . Myocardial infarction   . Cardiomyopathy     EF 40%  . Rhinosinusitis   . Diabetes mellitus, type 2   . Colon polyps   . Thrombocytopenia   . OSA (obstructive sleep apnea)     07/25/10 AHI 14.9/hr  . PONV (postoperative nausea and vomiting)   . Hypertension   . Myocardial infarction     pt. states diagnosed in 2005  . Stroke   . Vision decreased   . Hyperlipidemia   . Sleep apnea   . Shortness of breath  dyspnea   . History of pneumonia   . History of bronchitis   . Diabetes mellitus without complication     type II  . History of gout   . Arthritis   . Idiopathic thrombocytopenia   . Primary localized osteoarthritis of left knee 06/07/2015    Past Surgical History  Procedure Laterality Date  . Knee cartilage surgery      left  . Umbilical hernia repair    . Sinus irrigation    . Meniscus repair Left   . Nasal sinus surgery    . Cardiac catheterization  2005    no stents  . Cyst excision      chest and back  . Hernia repair      umbilical  . Total knee arthroplasty Left 06/07/2015    Procedure: TOTAL KNEE ARTHROPLASTY;  Surgeon: [name], MD;  Location: [place];  Service: Orthopedics;  Laterality: Left;     Current Outpatient Prescriptions  Medication Sig Dispense Refill  . aspirin EC 81 MG tablet Take 81 mg by mouth daily.    . carvedilol (COREG) 6.25 MG tablet Take 6.25 mg by mouth daily.    . clopidogrel (PLAVIX) 75 MG tablet Take 75 mg by  mouth daily.    . Coenzyme Q10 (CO Q 10 PO) Take 1 tablet by mouth daily.    . fexofenadine (ALLEGRA) 180 MG tablet Take 180 mg by mouth daily.    . finasteride (PROSCAR) 5 MG tablet Take 5 mg by mouth daily.    . folic acid (FOLVITE) 427 MCG tablet Take 400 mcg by mouth daily.    [name] lisinopril (PRINIVIL,ZESTRIL) 10 MG tablet Take 10 mg by mouth daily.    . metFORMIN (GLUCOPHAGE) 500 MG tablet Take 500 mg by mouth daily with breakfast.    . Omega-3 Fatty Acids (FISH OIL PO) Take 1 tablet by mouth daily.    . pravastatin (PRAVACHOL) 40 MG tablet Take 40 mg by mouth daily.    . tamsulosin (FLOMAX) 0.4 MG CAPS capsule Take 0.4 mg by mouth daily.    . temazepam (RESTORIL) 15 MG capsule Take 15 mg by mouth at bedtime as needed for sleep.     No current facility-administered medications for this visit.    Allergies:   Plavix    Social History:  The patient  reports that he has been smoking Cigars.  He has never used smokeless  tobacco. He reports that he drinks alcohol. He reports that he does not use illicit drugs.   Family History:  The patient's family history includes Hypertension in his father. There is no history of Colon cancer.    ROS:  Please see the history of present illness.   Otherwise, review of systems are positive for right knee pain.   All other systems are reviewed and negative.    PHYSICAL EXAM: VS:  BP 106/74 mmHg  Pulse 68  Ht 5' 11.5" (1.816 m)  Wt 317 lb 6.4 oz (143.972 kg)  BMI 43.66 kg/m2  SpO2 95% , BMI Body mass index is 43.66 kg/(m^2). GEN: Well nourished, well developed, in no acute distress HEENT: normal Neck: no JVD, carotid bruits, or masses Cardiac: RRR; no murmurs, rubs, or gallops,no edema  Respiratory:  clear to auscultation bilaterally, normal work of breathing GI: soft, nontender, nondistended, + BS, obese MS: no deformity or atrophy Skin: warm and dry, no rash Neuro:  Strength and sensation are intact Psych: euthymic mood, full affect    Recent Labs: 05/09/2015: ALT 17 06/10/2015: BUN 10; Creatinine, Ser 0.77; Hemoglobin 10.8*; Platelets 73*; Potassium 4.1; Sodium 133*   Lipid Panel No results found for: CHOL, TRIG, HDL, CHOLHDL, VLDL, LDLCALC, LDLDIRECT   Other studies Reviewed: Additional studies/ records that were reviewed today with results demonstrating: LDL 79 in 7/16.  HbA1C 6.8 in 7/16.  Labs personally reviewed. .   ASSESSMENT AND PLAN:  1. CAD: Patient has a known occluded RCA and cath in [place] in 2004. Stress Myoview in 05/2014 was low risk. No recent angina.  Continue aggressive secondary prevention. 2. Obesity: he has lost some weight.  He should continue regular exercise to try to lose weight.  He should watch his diet carefully as well.   3. HTN: BP controlled.  Continue current meds. 4. Tobacco use.  Still smoking cigars, not trying to give them up. 5. Mitral regurgitation noted on prior echo.  No CHF sx at this time.  6. Hyperlipidemia:  well controlled.  Continue current meds.   Current medicines are reviewed at length with the patient today.  The patient concerns regarding his medicines were addressed.  The following changes have been made:  No change  Labs/ tests ordered today include:  No orders of  the defined types were placed in this encounter.    Recommend 150 minutes/week of aerobic exercise Low fat, low carb, high fiber diet recommended  Disposition:   FU in 1 year   Teresita Madura., MD  08/17/2015 11:08 AM    [facility] [address], [place], [place]  [address] Phone: [phone]; Fax: [phone]

## 2015-11-23 ENCOUNTER — Telehealth: Payer: Self-pay | Admitting: *Deleted

## 2016-05-03 ENCOUNTER — Ambulatory Visit: Payer: Medicare PPO | Admitting: Internal Medicine

## 2016-05-15 ENCOUNTER — Encounter: Payer: Self-pay | Admitting: Internal Medicine

## 2016-05-15 ENCOUNTER — Ambulatory Visit (INDEPENDENT_AMBULATORY_CARE_PROVIDER_SITE_OTHER): Payer: Medicare PPO | Admitting: Internal Medicine

## 2016-05-15 VITALS — BP 128/62 | HR 65 | Ht 72.0 in | Wt 344.6 lb

## 2016-05-15 DIAGNOSIS — F17200 Nicotine dependence, unspecified, uncomplicated: Secondary | ICD-10-CM

## 2016-05-15 DIAGNOSIS — E669 Obesity, unspecified: Secondary | ICD-10-CM

## 2016-05-15 DIAGNOSIS — G4733 Obstructive sleep apnea (adult) (pediatric): Secondary | ICD-10-CM | POA: Diagnosis not present

## 2016-05-15 NOTE — Assessment & Plan Note (Signed)
Morbid obesity. He is getting mobility back after successful left knee replacement last year and hopefully will be able to do a little better at getting some weight off. Impact on sleep apnea control discussed.

## 2016-05-15 NOTE — Progress Notes (Signed)
Patient ID: Gordon Madden, male    DOB: 1951-07-10, 64 y.o.   MRN: [id number]  HPI 08/01/11 59 yoM smoker followed for hypersomnia with sleep apnea, rhinitis, complicated by DM, HBP, CAD/MI, CVA/stroke    Dr Kelton Pillar PCP Last here 12/14/2010 OSA -CPAP 10 Advanced-using all night every night. New visual has helped for breakthrough daytime sleepiness. Scheduled for sinus surgery a week from today with Dr Simeon Craft ENT. Sinus pain began two months ago. CT positive after several antibiotics. Dx'd sinusitis with septal deviation. He had concerns about use of CPAP after the surgery and we discussed - he will probably have to skip CPAP briefly. He felt comfortably leaving off CPAP for a few days while travelling, otherwise he has used it regularly.  Denies chest tightness, cough or increased shortness of breath. Newly dx'd DMII by his PCP. He is walking 40 minutes daily.  Still smoking 1 cigar daily- not inhaling. Quit his 3ppd cigarette habit 20 years ago.   12/05/11- 60 yoM smoker followed for hypersomnia with sleep apnea, rhinitis, complicated by DM, HBP, CAD/MI, CVA/stroke    Dr Kelton Pillar PCP He had sinus surgery by Dr. Simeon Craft. 5 says he is not snoring without his CPAP but he still feels better, sleeps better, wearing CPAP. No sinus infections or colds since the surgery and otherwise doing well. He does admit he has gained 8 pounds with the holidays. CPAP pressure is set at 10 and has used all night every night.  03/31/12-  39 yoM  Cigar smoker followed for hypersomnia with sleep apnea, rhinitis, complicated by DM, HBP, CAD/MI, CVA/stroke     Dr Kelton Pillar PCP  Uses CPAP every night for approx 7 hours; pressure working well for [facility] is DME company. 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 Taking Allegra for seasonal rhinitis.  03/31/13- 79 yoM Cigar smoker followed for hypersomnia with sleep apnea, rhinitis, complicated by DM, HBP, CAD/MI, CVA/stroke   FOLLOWS FOR: wears CPAP 10/ Advanced every night for about 7 hours and pressure working well for patient;    We discussed usual guidelines for replacement of machine and mask. Rare use Nuvigil, and usually then will take part of a pill. Little pollen allergy this spring.   03/31/14- 40 yoM Cigar smoker followed for hypersomnia with sleep apnea, rhinitis, complicated by DM, HBP, CAD/MI, CVA/stroke  FOLLOWS FOR:  Wearing CPAP 10/ Advanced 6-7 hours per night.  Pressure doing well New pain right anterolateral chest for the past 4 or 5 weeks, radiating around to the back on right side and scored 5/10 "like indigestion" with nausea. Constant aching for the last several hours finally resolve spontaneously. Primary physician questions gallbladder. Breathing has been okay. Pain is not pleuritic it is not hurting now. Insomnia-asks about getting sleep medicine for "busy brain"-discussed.  05/04/15- 32 yoM Cigar smoker followed for hypersomnia with sleep apnea, insomnia, rhinitis, complicated by DM, HBP, CAD/MI, CVA/stroke  Reports: Pt. wears CPAP 10/ Advanced for about 6-7 hours nightly, no complaints about mask and tubing, pt. feels fairly well Still smokes a cigar a day and says he gained weight when he tried to stop. Pending left TKR Insomnia-rare need for temazepam but helpful when used.  05/15/2016-65 year old male cigar smoker followed for OSA, insomnia, rhinitis, complicated by DM, HBP, CAD/MI, CVA/stroke CPAP 10/Advanced FOLLOWS FOR:DME:[facility]. Pt states he wears CPAP nightly about 7 hours; no SD card in CPAP and not in Airview-will need order to [facility] for SD card and DL.  Admits weight gain on recent trip to [place]. He change to nasal pillows mask. Pressure is quite comfortable. He has skipped using machine for a week or 200 time on a couple of trips-discussed.  Review of Systems-see HPI Constitutional:   No-   weight loss, night sweats, fevers, chills, fatigue, lassitude. HEENT:   No-  headaches,  difficulty swallowing, tooth/dental problems, sore throat,      No-sneezing, itching, ear ache, nasal congestion, post nasal drip,  CV:  +chest pain, orthopnea, PND, swelling in lower extremities, anasarca, dizziness, palpitations Resp: No- acute  shortness of breath with exertion or at rest.              No-   productive cough,  No non-productive cough,  No-  coughing up of blood.              No-   change in color of mucus.  No- wheezing.   Skin: No-   rash or lesions. GI:  No-   heartburn, indigestion, abdominal pain, +nausea,  GU:  MS:  +joint pain or swelling. . Neuro- nothing unusual  Psych:  No- change in mood or affect. No depression or anxiety.  No memory loss.  Objective:   Physical Exam General- Alert, Oriented, Affect-appropriate, Distress- none acute;  + Morbidly obese Skin- + cool, clammy Lymphadenopathy- none Head- atraumatic            Eyes- Gross vision intact, PERRLA, conjunctivae clear secretions            Ears- Hearing, canals normal            Nose- Clear, Septal dev- not apparent from anterior., mucus, polyps, erosion, perforation             Throat- Mallampati III , mucosa clear , drainage- none, tonsils- atrophic Neck- flexible , trachea midline, no stridor , thyroid nl, carotid no bruit Chest - symmetrical excursion , unlabored           Heart/CV- RRR , no murmur , no gallop  , no rub, nl s1 s2                           - JVD- none , edema- none, stasis changes+, varices- none           Lung- clear to P&A, wheeze- none, cough- none , dullness-none, rub- none           Chest wall-  Abd-  Br/ Gen/ Rectal- Not done, not indicated Extrem- cyanosis- none, clubbing, none, atrophy- none, strength- nl, + surgical scar left knee Neuro- grossly intact to observation

## 2016-05-15 NOTE — Assessment & Plan Note (Signed)
He has set a quit date at the first of next month and is encouraged to stick with this.

## 2016-05-15 NOTE — Assessment & Plan Note (Signed)
We discussed compliance goals, especially with his history of MI and CVA. Plan-DME company can discuss eligibility for replacement of old machine and also needs to replace his ST card or put him on [employer] for documentation

## 2016-05-15 NOTE — Patient Instructions (Signed)
Order- DME [facility]  We can continue CPAP 10, mask of choice, humidifier, supplies, AirView  Dx OSA                      Please advise eligibility for replacement of older machine                      Please provide card or add AirView for compliance control  Please call if we can help

## 2016-08-03 ENCOUNTER — Encounter: Payer: Self-pay | Admitting: Interventional Cardiology

## 2016-08-08 IMAGING — CR DG KNEE 1-2V PORT*L*
2 series · 2 of 2 positions shown · non-contrast
Comparison: None.

CLINICAL DATA: Post left total knee arthroplasty for DJD.

EXAM:
PORTABLE LEFT KNEE - 1-2 VIEW

[AP]
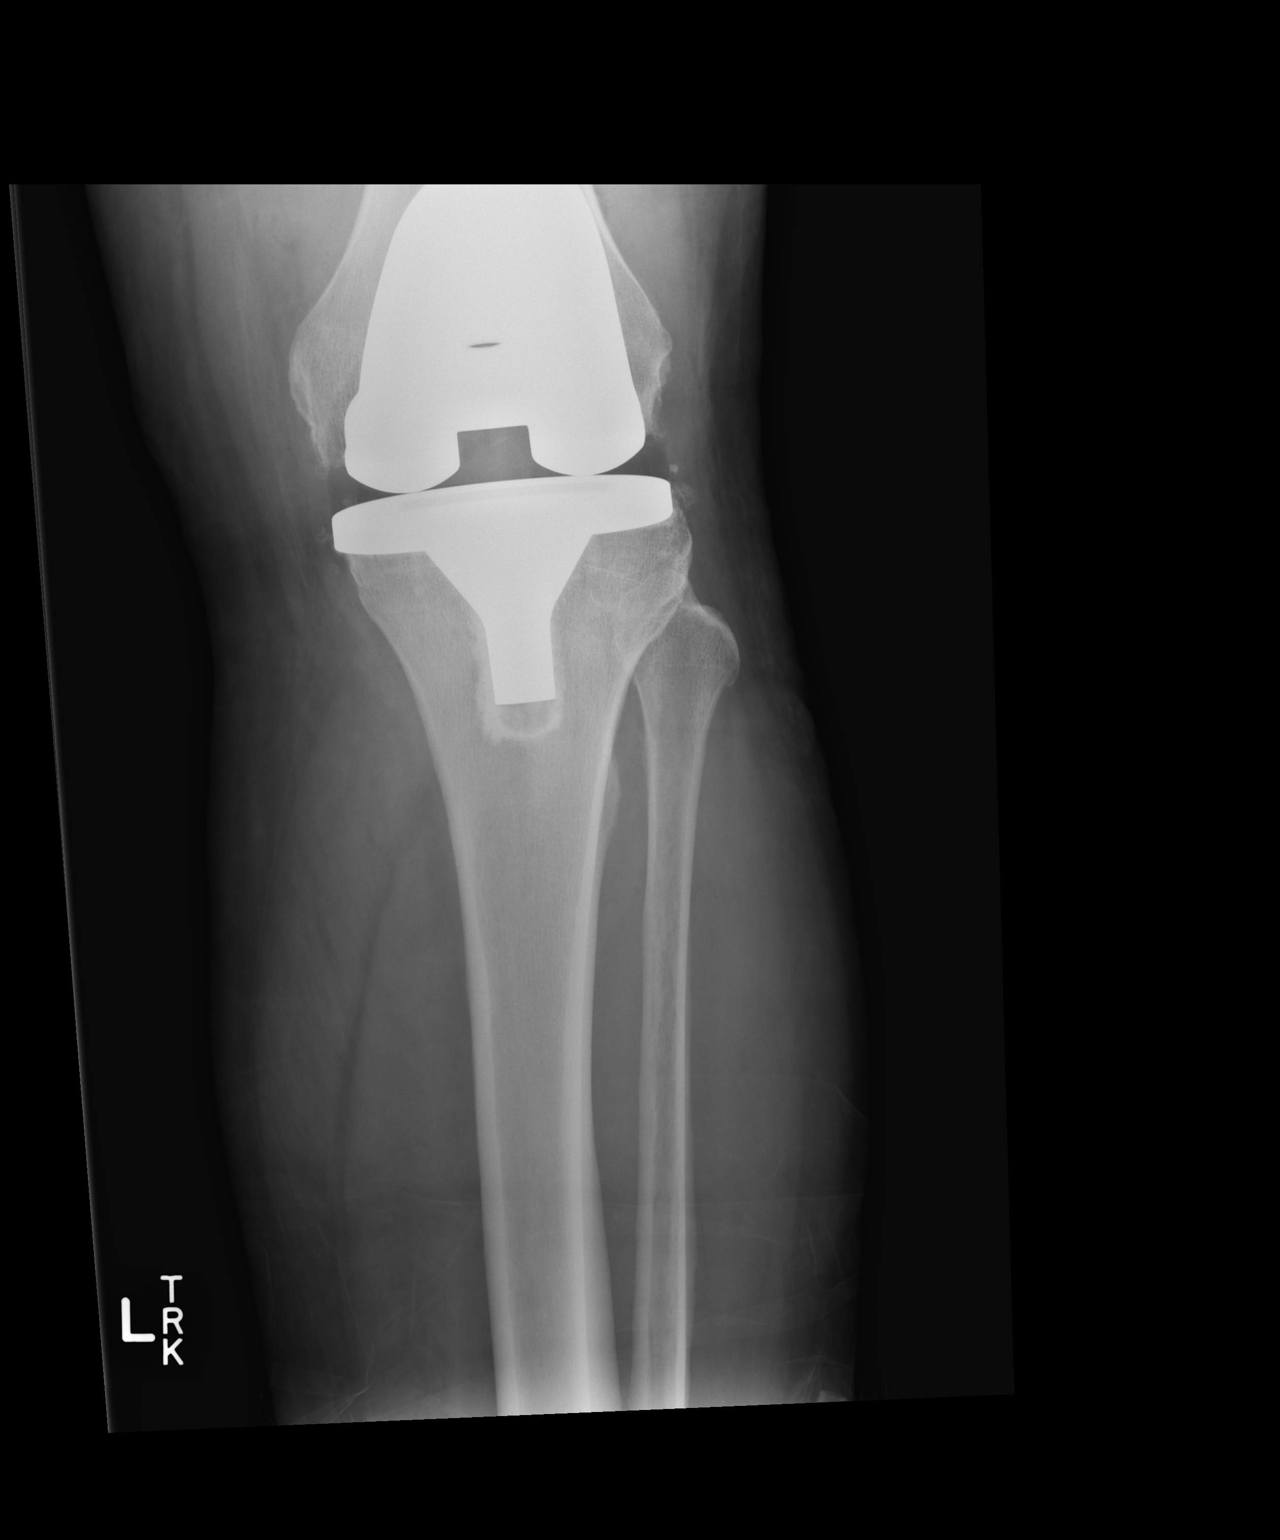

[xtable lateral]
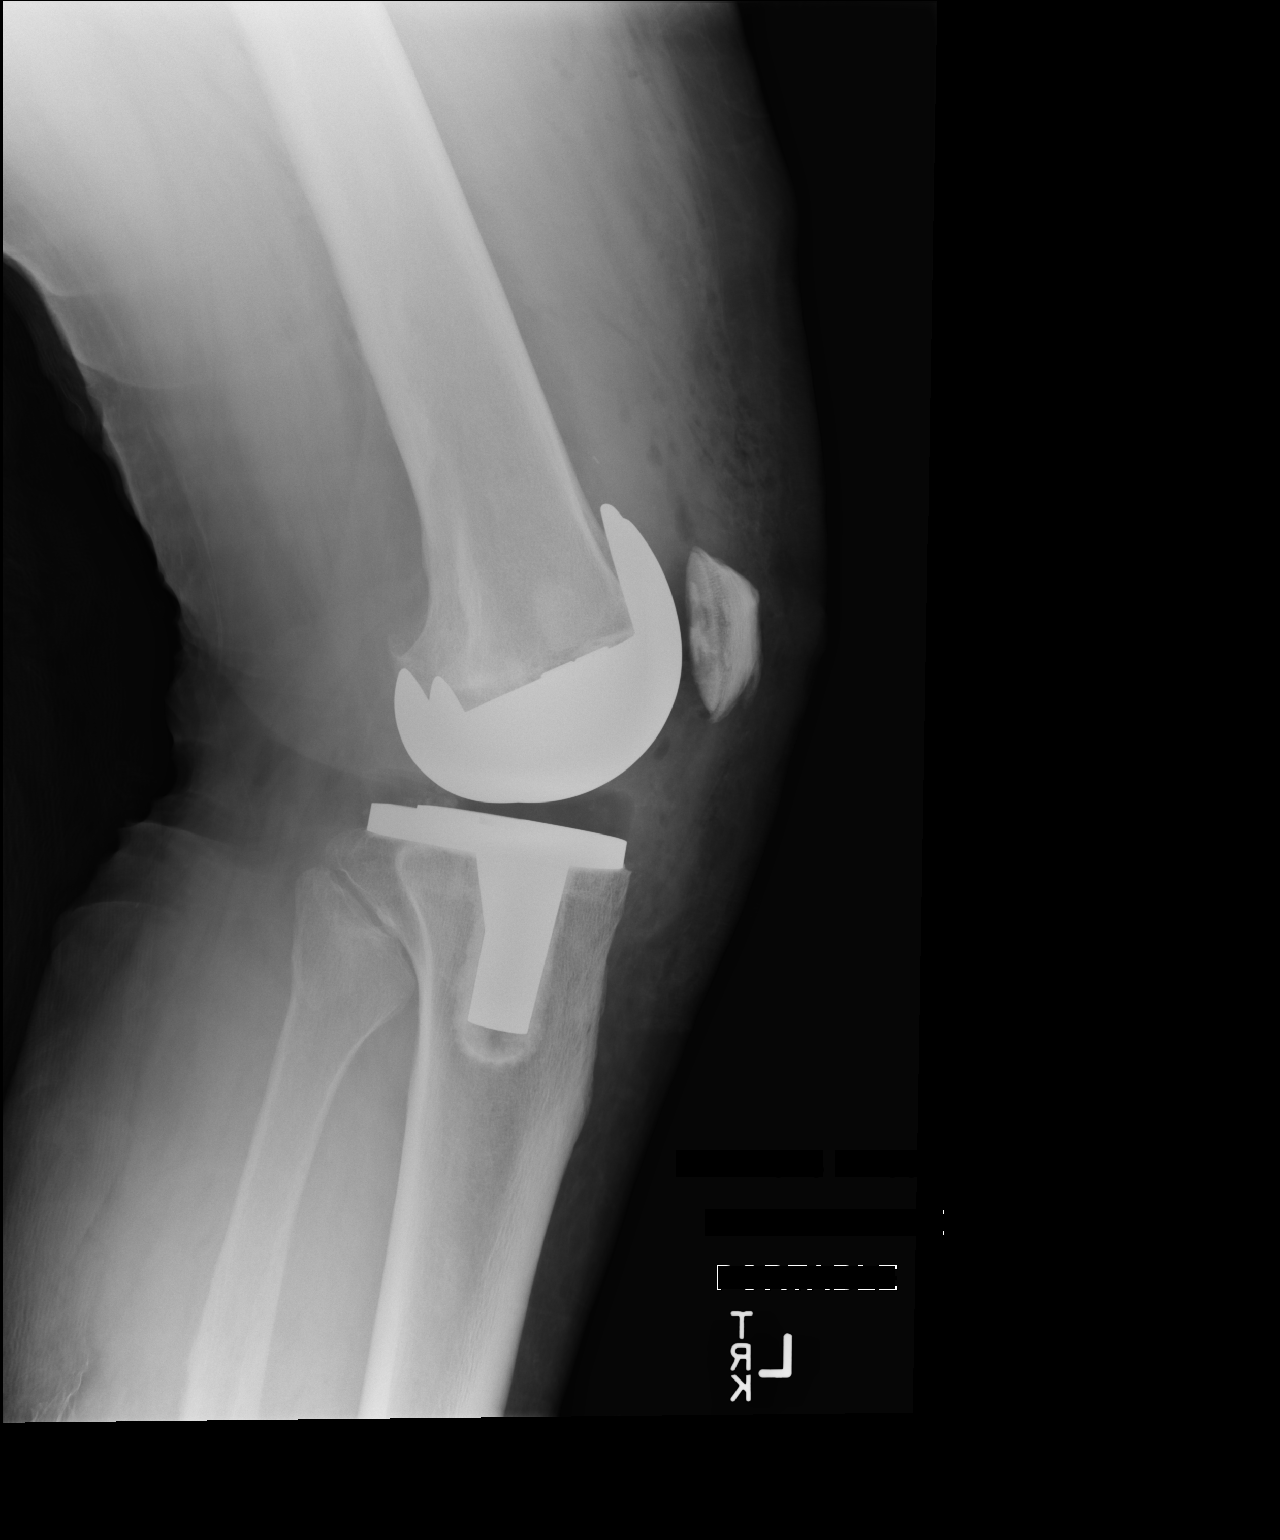

[2 of 2 positions shown; findings below may reference images not displayed]

FINDINGS: Patient has undergone left total knee arthroplasty. Hardware appears
well seated. No acute fracture or subluxation. Soft tissue in joint
space gas noted following surgery.
IMPRESSION: Status post total knee arthroplasty.  No adverse features.

## 2016-08-16 NOTE — Progress Notes (Signed)
Patient ID: Gordon Madden, male   DOB: 20-Apr-1951, 65 y.o.   MRN: [id number]     Cardiology Office Note   Date:  08/17/2016   ID:  SAHIL SUM, DOB [birth year], MRN [id number]  PCP:  Osborne Casco, MD    No chief complaint on file.  CAD, mitral regurgitation  Wt Readings from Last 3 Encounters:  08/17/16 (!) 343 lb (155.6 kg)  05/15/16 (!) 344 lb 9.6 oz (156.3 kg)  08/17/15 (!) 317 lb 6.4 oz (144 kg)       History of Present Illness: Gordon Madden is a 65 y.o. male   with CAD. He takes CoQ10 and better tolerates pravastatin. Chronic joint pains. Decreased 25 minute walk daily after left knee replacement in July 2016.  Now has right knee pain.   Blood sugar increased.  He is sweating more with the weight gain.  THis happened before when his weight was higher in the past.    Had 2 episodes of right upper quadrant pain in 2015. THought to be gall bladder attacks. Avoiding fatty foods. ECG was negative. He had a stress test which did not show any new ischemia.  Several flares have been relieved by apple cider vinegar.  Increased metformin with increase in HbA1C.  Most recent A1C increased to 7.4.  Weight increased after a cruise, family stress and travel.   CAD/ASCVD:  Denies : Chest pain.  Dizziness.  Leg edema.  Nitroglycerin.  Palpitations resolved.  Syncope.   DOE with walking after the weight gain.  Still able to complete the walk described above.  No plan for knee surgery at this point.  He is starting to have vertigo sx as well.   Platelets have decreased- chronic thrombocytopenia, better after TKR. It then increased but dropped again back to baseline.  No cardiac issues with left TKR. Looking ahead, he may ned a right TKR as well.  Portion control was key to weight loss in the past.      Past Medical History:  Diagnosis Date  . Arthritis   . BPH (benign prostatic hyperplasia)   . Cardiomyopathy    EF 40%  . Colon polyps   . CVA (cerebral  infarction)    Renal artery branch occlusion  . Diabetes mellitus without complication ([place])    type II  . Diabetes mellitus, type 2 ([place])   . History of bronchitis   . History of gout   . History of pneumonia   . Hyperlipidemia   . Hypertension   . Idiopathic thrombocytopenia ([place])   . Myocardial infarction ([place])   . Myocardial infarction [facility])    pt. states diagnosed in 2005  . OSA (obstructive sleep apnea)    07/25/10 AHI 14.9/hr  . PONV (postoperative nausea and vomiting)   . Primary localized osteoarthritis of left knee 06/07/2015  . Rhinosinusitis   . Shortness of breath dyspnea   . Sleep apnea   . Stroke ([employer])   . Thrombocytopenia ([place])   . Vision decreased     Past Surgical History:  Procedure Laterality Date  . CARDIAC CATHETERIZATION  2005   no stents  . CYST EXCISION     chest and back  . HERNIA REPAIR     umbilical  . KNEE CARTILAGE SURGERY     left  . MENISCUS REPAIR Left   . NASAL SINUS SURGERY    . SINUS IRRIGATION    . TOTAL KNEE ARTHROPLASTY Left 06/07/2015   Procedure: TOTAL KNEE  ARTHROPLASTY;  Surgeon: [name], MD;  Location: [facility];  Service: Orthopedics;  Laterality: Left;  . UMBILICAL HERNIA REPAIR       Current Outpatient Prescriptions  Medication Sig Dispense Refill  . aspirin EC 81 MG tablet Take 81 mg by mouth daily.    . carvedilol (COREG) 6.25 MG tablet Take 6.25 mg by mouth daily.    . clopidogrel (PLAVIX) 75 MG tablet Take 75 mg by mouth daily.    . Coenzyme Q10 (CO Q 10 PO) Take 1 tablet by mouth daily.    . fexofenadine (ALLEGRA) 180 MG tablet Take 180 mg by mouth daily.    . folic acid (FOLVITE) A999333 MCG tablet Take 400 mcg by mouth daily.    [name] lisinopril (PRINIVIL,ZESTRIL) 10 MG tablet Take 10 mg by mouth daily.    . metFORMIN (GLUCOPHAGE) 500 MG tablet Take 1,000 mg by mouth daily with breakfast.     . Omega-3 Fatty Acids (FISH OIL PO) Take 1 tablet by mouth daily.    . pravastatin (PRAVACHOL) 40 MG tablet Take 40 mg by mouth  daily.    . temazepam (RESTORIL) 15 MG capsule Take 15 mg by mouth at bedtime as needed for sleep.     No current facility-administered medications for this visit.     Allergies:   Plavix [clopidogrel]    Social History:  The patient  reports that he has been smoking Cigars.  He has never used smokeless tobacco. He reports that he drinks alcohol. He reports that he does not use drugs.   Family History:  The patient's family history includes Hypertension in his father.    ROS:  Please see the history of present illness.   Otherwise, review of systems are positive for right knee pain.   All other systems are reviewed and negative.    PHYSICAL EXAM: VS:  BP 137/90   Pulse 63   Ht 6' (1.829 m)   Wt (!) 343 lb (155.6 kg)   BMI 46.52 kg/m  , BMI Body mass index is 46.52 kg/m. GEN: Well nourished, well developed, in no acute distress  HEENT: normal  Neck: no JVD, carotid bruits, or masses Cardiac: RRR; no murmurs, rubs, or gallops,no edema  Respiratory:  clear to auscultation bilaterally, normal work of breathing GI: soft, nontender, nondistended, + BS, obese MS: no deformity or atrophy  Skin: warm and dry, no rash Neuro:  Strength and sensation are intact Psych: euthymic mood, full affect  ECG: NSR, PVC  Recent Labs: No results found for requested labs within last 8760 hours.   Lipid Panel No results found for: CHOL, TRIG, HDL, CHOLHDL, VLDL, LDLCALC, LDLDIRECT   Other studies Reviewed: Additional studies/ records that were reviewed today with results demonstrating: LDL 79 in 7/16.  HbA1C 6.8 in 7/16.  Labs personally reviewed. .   ASSESSMENT AND PLAN:  1. CAD: Patient has a known occluded RCA and cath in [place] in 2004. Stress Myoview in 05/2014 was low risk. No recent angina.  Continue aggressive secondary prevention. 2. Obesity: he has gained some weight.  I thnk this is responsible for his shortness of breath.  He should continue regular exercise to try to lose  weight.  He should watch his diet carefully as well.  Decrease portion sizes. 3. HTN: BP controlled.  Continue current meds. 4. Tobacco use.  Still smoking cigars, not trying to give them up.  Still thinking about it. 5. Mitral regurgitation noted on prior echo.  No  CHF sx at this time.  6. Hyperlipidemia: well controlled.  Continue current meds.   Current medicines are reviewed at length with the patient today.  The patient concerns regarding his medicines were addressed.  The following changes have been made:  No change  Labs/ tests ordered today include:  No orders of the defined types were placed in this encounter.   Recommend 150 minutes/week of aerobic exercise Low fat, low carb, high fiber diet recommended  Disposition:   FU in 1 year   Signed, Larae Grooms, MD  08/17/2016 8:46 AM    [facility] [place], [place],   [address] Phone: [phone]; Fax: [phone]

## 2016-08-17 ENCOUNTER — Ambulatory Visit (INDEPENDENT_AMBULATORY_CARE_PROVIDER_SITE_OTHER): Payer: Medicare PPO | Admitting: Interventional Cardiology

## 2016-08-17 ENCOUNTER — Encounter: Payer: Self-pay | Admitting: Interventional Cardiology

## 2016-08-17 VITALS — BP 137/90 | HR 63 | Ht 72.0 in | Wt 343.0 lb

## 2016-08-17 DIAGNOSIS — I252 Old myocardial infarction: Secondary | ICD-10-CM

## 2016-08-17 DIAGNOSIS — F17200 Nicotine dependence, unspecified, uncomplicated: Secondary | ICD-10-CM

## 2016-08-17 DIAGNOSIS — I10 Essential (primary) hypertension: Secondary | ICD-10-CM | POA: Diagnosis not present

## 2016-08-17 DIAGNOSIS — I2510 Atherosclerotic heart disease of native coronary artery without angina pectoris: Secondary | ICD-10-CM

## 2016-08-17 DIAGNOSIS — E669 Obesity, unspecified: Secondary | ICD-10-CM | POA: Diagnosis not present

## 2016-08-17 DIAGNOSIS — E1159 Type 2 diabetes mellitus with other circulatory complications: Secondary | ICD-10-CM

## 2016-08-17 NOTE — Patient Instructions (Signed)
**Note De-identified Derryck Shahan Obfuscation** Medication Instructions:  Same-no changes  Labwork: None  Testing/Procedures: None  Follow-Up: Your physician wants you to follow-up in: 1 year. You will receive a reminder letter in the mail two months in advance. If you don't receive a letter, please call our office to schedule the follow-up appointment.      If you need a refill on your cardiac medications before your next appointment, please call your pharmacy.   

## 2016-12-06 DIAGNOSIS — E1121 Type 2 diabetes mellitus with diabetic nephropathy: Secondary | ICD-10-CM | POA: Diagnosis not present

## 2016-12-06 DIAGNOSIS — D696 Thrombocytopenia, unspecified: Secondary | ICD-10-CM | POA: Diagnosis not present

## 2016-12-06 DIAGNOSIS — G4733 Obstructive sleep apnea (adult) (pediatric): Secondary | ICD-10-CM | POA: Diagnosis not present

## 2016-12-06 DIAGNOSIS — N181 Chronic kidney disease, stage 1: Secondary | ICD-10-CM | POA: Diagnosis not present

## 2016-12-06 DIAGNOSIS — I429 Cardiomyopathy, unspecified: Secondary | ICD-10-CM | POA: Diagnosis not present

## 2016-12-06 DIAGNOSIS — I2510 Atherosclerotic heart disease of native coronary artery without angina pectoris: Secondary | ICD-10-CM | POA: Diagnosis not present

## 2016-12-06 DIAGNOSIS — Z7984 Long term (current) use of oral hypoglycemic drugs: Secondary | ICD-10-CM | POA: Diagnosis not present

## 2016-12-06 DIAGNOSIS — Z79899 Other long term (current) drug therapy: Secondary | ICD-10-CM | POA: Diagnosis not present

## 2016-12-06 DIAGNOSIS — E782 Mixed hyperlipidemia: Secondary | ICD-10-CM | POA: Diagnosis not present

## 2016-12-06 DIAGNOSIS — E1165 Type 2 diabetes mellitus with hyperglycemia: Secondary | ICD-10-CM | POA: Diagnosis not present

## 2017-01-24 ENCOUNTER — Telehealth: Payer: Self-pay | Admitting: Internal Medicine

## 2017-01-24 DIAGNOSIS — G4733 Obstructive sleep apnea (adult) (pediatric): Secondary | ICD-10-CM

## 2017-01-24 NOTE — Telephone Encounter (Signed)
CY  Please Advise-  Pt is eligible for a new cpap machine verified by [facility]. Pt wanted to know if we could place an order for him to obtain a new machine.

## 2017-01-24 NOTE — Telephone Encounter (Signed)
Pt aware that order has been placed to [facility] for replacement CPAP. Nothing further needed.

## 2017-01-24 NOTE — Telephone Encounter (Signed)
Order- DME [facility], replacement for old CPAP machine auto 5-15, mask of choice, humidifier, supplies, AirView                            Dx OSA

## 2017-01-25 ENCOUNTER — Telehealth: Payer: Self-pay | Admitting: Internal Medicine

## 2017-01-25 NOTE — Telephone Encounter (Signed)
Spoke with pt. He is aware that he will need an OV before [facility] will supply him with a new machine. ROV has been scheduled with TP on 02/04/17 at 10:15am. Nothing further was needed.

## 2017-01-28 DIAGNOSIS — M1711 Unilateral primary osteoarthritis, right knee: Secondary | ICD-10-CM | POA: Diagnosis not present

## 2017-02-04 ENCOUNTER — Encounter: Payer: Self-pay | Admitting: Adult Health

## 2017-02-04 ENCOUNTER — Ambulatory Visit (INDEPENDENT_AMBULATORY_CARE_PROVIDER_SITE_OTHER): Payer: Medicare HMO | Admitting: Adult Health

## 2017-02-04 DIAGNOSIS — G4710 Hypersomnia, unspecified: Secondary | ICD-10-CM | POA: Diagnosis not present

## 2017-02-04 DIAGNOSIS — G4733 Obstructive sleep apnea (adult) (pediatric): Secondary | ICD-10-CM

## 2017-02-04 MED ORDER — ARMODAFINIL 150 MG PO TABS: 150.0000 mg | ORAL_TABLET | Freq: Every day | ORAL | 0 refills | Status: DC | PRN

## 2017-02-04 NOTE — Assessment & Plan Note (Signed)
Well controlled on CPAP  Machine is broken and needs new machine , order sent to DME   Plan  Patient Instructions  Order for new CPAP machine .  Restart CPAP At bedtime  When available .  Work on weight loss Do not drive if sleepy.  May use Nuvigil daily for daytime sleepiness.  Follow up Dr. Annamaria Boots  In 3 months as planned and As needed

## 2017-02-04 NOTE — Assessment & Plan Note (Signed)
Mild hypersomnia despite controlled OSA , uses Nuvigil for last several years sparely which helps.  Refill x 1 month given.

## 2017-02-04 NOTE — Patient Instructions (Addendum)
Order for new CPAP machine .  Restart CPAP At bedtime  When available .  Work on weight loss Do not drive if sleepy.  May use Nuvigil daily for daytime sleepiness.  Follow up Dr. Annamaria Boots  In 3 months as planned and As needed

## 2017-02-04 NOTE — Progress Notes (Signed)
@Patient  ID: Gordon Madden, male    DOB: 08/30/1951, 66 y.o.   MRN: [id number]  Chief Complaint  Patient presents with  . Follow-up    OSA    Referring provider: Kelton Pillar, MD  HPI: 66 yo Male followed for obstructive sleep apnea. Hx of CVA   02/04/2017 Follow up : OSA /Hypersomnia  Pt  presents for a follow-up for sleep apnea. Patient is controlled on C Pap at bedtime. Patient says that his C Pap machine is old and stopped working about a week ago. Patient says that he uses it every single night and cannot live without it. His insurance required an office visit today to get approved for another C Pap machine. Download shows excellent compliance with average usage at hours. He is on a set pressure of 9 cm H2O. AHI 0.1. Minimum leaks. He says that he feels rested while using his C Pap machine. Patient does use Nuvigil As needed  For daytime sleepiness. Mainly uses it when he drives . Does not take It very often.  Has used 1 bottle for last 3 years that he was given. Needs refill.        Allergies  Allergen Reactions  . Plavix [Clopidogrel] Other (See Comments)    thrombocytopenia    Immunization History  Administered Date(s) Administered  . Influenza Split 09/27/2011, 08/26/2012, 08/26/2013, 08/26/2016  . Influenza-Unspecified 08/26/2014  . Pneumococcal Polysaccharide-23 11/26/2008, 06/10/2015  . Zoster 08/26/2012    Past Medical History:  Diagnosis Date  . Arthritis   . BPH (benign prostatic hyperplasia)   . Cardiomyopathy    EF 40%  . Colon polyps   . CVA (cerebral infarction)    Renal artery branch occlusion  . Diabetes mellitus without complication ([place])    type II  . Diabetes mellitus, type 2 ([place])   . History of bronchitis   . History of gout   . History of pneumonia   . Hyperlipidemia   . Hypertension   . Idiopathic thrombocytopenia ([place])   . Myocardial infarction   . Myocardial infarction    pt. states diagnosed in 2005  . OSA (obstructive  sleep apnea)    07/25/10 AHI 14.9/hr  . PONV (postoperative nausea and vomiting)   . Primary localized osteoarthritis of left knee 06/07/2015  . Rhinosinusitis   . Shortness of breath dyspnea   . Sleep apnea   . Stroke ([place])   . Thrombocytopenia ([place])   . Vision decreased     Tobacco History: History  Smoking Status  . Former Smoker  . Packs/day: 3.00  . Years: 20.00  . Types: Cigars, Cigarettes  Smokeless Tobacco  . Never Used    Comment: stopped smoking cigarettes in 1991, smoked cigars from 2000 to Jan 2018, 1 cigar per day   Counseling given: Not Answered   Outpatient Encounter Prescriptions as of 02/04/2017  Medication Sig  . Armodafinil 150 MG tablet Take 1 tablet (150 mg total) by mouth daily as needed.  [name] aspirin EC 81 MG tablet Take 81 mg by mouth daily.  . carvedilol (COREG) 6.25 MG tablet Take 6.25 mg by mouth daily.  . clopidogrel (PLAVIX) 75 MG tablet Take 75 mg by mouth daily.  . Coenzyme Q10 (CO Q 10 PO) Take 1 tablet by mouth daily.  . fexofenadine (ALLEGRA) 180 MG tablet Take 180 mg by mouth daily.  . folic acid (FOLVITE) 409 MCG tablet Take 400 mcg by mouth daily.  [name] lisinopril (PRINIVIL,ZESTRIL) 10 MG tablet Take  10 mg by mouth daily.  . metFORMIN (GLUCOPHAGE) 500 MG tablet Take 1,000 mg by mouth daily with breakfast.   . Omega-3 Fatty Acids (FISH OIL PO) Take 1 tablet by mouth daily.  . pravastatin (PRAVACHOL) 40 MG tablet Take 40 mg by mouth daily.  . [DISCONTINUED] Armodafinil 150 MG tablet Take 150 mg by mouth daily as needed.  . [DISCONTINUED] temazepam (RESTORIL) 15 MG capsule Take 15 mg by mouth at bedtime as needed for sleep.   No facility-administered encounter medications on file as of 02/04/2017.      Review of Systems  Constitutional:   No  weight loss, night sweats,  Fevers, chills,  +fatigue, or  lassitude.  HEENT:   No headaches,  Difficulty swallowing,  Tooth/dental problems, or  Sore throat,                No sneezing, itching, ear  ache, nasal congestion, post nasal drip,   CV:  No chest pain,  Orthopnea, PND, swelling in lower extremities, anasarca, dizziness, palpitations, syncope.   GI  No heartburn, indigestion, abdominal pain, nausea, vomiting, diarrhea, change in bowel habits, loss of appetite, bloody stools.   Resp: No shortness of breath with exertion or at rest.  No excess mucus, no productive cough,  No non-productive cough,  No coughing up of blood.  No change in color of mucus.  No wheezing.  No chest wall deformity  Skin: no rash or lesions.  GU: no dysuria, change in color of urine, no urgency or frequency.  No flank pain, no hematuria   MS:  No joint pain or swelling.  No decreased range of motion.  No back pain.    Physical Exam  BP 114/72 (BP Location: Left Arm, Cuff Size: Large)   Pulse 71   Ht 5' 11.5" (1.816 m)   Wt (!) 338 lb 9.6 oz (153.6 kg)   SpO2 95%   BMI 46.57 kg/m   GEN: A/Ox3; pleasant , NAD, obese    HEENT:  /AT,  EACs-clear, TMs-wnl, NOSE-clear, THROAT-clear, no lesions, no postnasal drip or exudate noted. Class 2-3 MP airway   NECK:  Supple w/ fair ROM; no JVD; normal carotid impulses w/o bruits; no thyromegaly or nodules palpated; no lymphadenopathy.    RESP  Clear  P & A; w/o, wheezes/ rales/ or rhonchi. no accessory muscle use, no dullness to percussion  CARD:  RRR, no m/r/g, no peripheral edema, pulses intact, no cyanosis or clubbing.  GI:   Soft & nt; nml bowel sounds; no organomegaly or masses detected.   Musco: Warm bil, no deformities or joint swelling noted.   Neuro: alert, no focal deficits noted.    Skin: Warm, no lesions or rashes    Lab Results:  BMET  BNP No results found for: BNP   ProBNP Imaging: No results found.   Assessment & Plan:   Obstructive sleep apnea Well controlled on CPAP  Machine is broken and needs new machine , order sent to DME   Plan  Patient Instructions  Order for new CPAP machine .  Restart CPAP At bedtime   When available .  Work on weight loss Do not drive if sleepy.  May use Nuvigil daily for daytime sleepiness.  Follow up Dr. Annamaria Boots  In 3 months as planned and As needed       Hypersomnia Mild hypersomnia despite controlled OSA , uses Nuvigil for last several years sparely which helps.  Refill x 1 month given.  Rexene Edison, NP 02/04/2017

## 2017-02-04 NOTE — Addendum Note (Signed)
Addended by: Parke Poisson E on: 02/04/2017 11:48 AM   Modules accepted: Orders

## 2017-02-07 DIAGNOSIS — H903 Sensorineural hearing loss, bilateral: Secondary | ICD-10-CM | POA: Diagnosis not present

## 2017-02-08 ENCOUNTER — Telehealth: Payer: Self-pay | Admitting: Internal Medicine

## 2017-02-08 NOTE — Telephone Encounter (Signed)
PA initiated for Nuvigil 150mg  on [facility] Approved - PA Case # [id number]  Patient aware of approval. Nothing further needed.

## 2017-02-14 DIAGNOSIS — G4710 Hypersomnia, unspecified: Secondary | ICD-10-CM | POA: Diagnosis not present

## 2017-02-14 DIAGNOSIS — M6281 Muscle weakness (generalized): Secondary | ICD-10-CM | POA: Diagnosis not present

## 2017-02-14 DIAGNOSIS — Z96651 Presence of right artificial knee joint: Secondary | ICD-10-CM | POA: Diagnosis not present

## 2017-02-14 DIAGNOSIS — G4733 Obstructive sleep apnea (adult) (pediatric): Secondary | ICD-10-CM | POA: Diagnosis not present

## 2017-02-27 ENCOUNTER — Telehealth: Payer: Self-pay

## 2017-03-17 DIAGNOSIS — M6281 Muscle weakness (generalized): Secondary | ICD-10-CM | POA: Diagnosis not present

## 2017-03-17 DIAGNOSIS — G4733 Obstructive sleep apnea (adult) (pediatric): Secondary | ICD-10-CM | POA: Diagnosis not present

## 2017-03-17 DIAGNOSIS — G4710 Hypersomnia, unspecified: Secondary | ICD-10-CM | POA: Diagnosis not present

## 2017-03-17 DIAGNOSIS — Z96651 Presence of right artificial knee joint: Secondary | ICD-10-CM | POA: Diagnosis not present

## 2017-04-16 DIAGNOSIS — G4710 Hypersomnia, unspecified: Secondary | ICD-10-CM | POA: Diagnosis not present

## 2017-04-16 DIAGNOSIS — M6281 Muscle weakness (generalized): Secondary | ICD-10-CM | POA: Diagnosis not present

## 2017-04-16 DIAGNOSIS — G4733 Obstructive sleep apnea (adult) (pediatric): Secondary | ICD-10-CM | POA: Diagnosis not present

## 2017-04-16 DIAGNOSIS — Z96651 Presence of right artificial knee joint: Secondary | ICD-10-CM | POA: Diagnosis not present

## 2017-05-15 ENCOUNTER — Ambulatory Visit (INDEPENDENT_AMBULATORY_CARE_PROVIDER_SITE_OTHER): Payer: Medicare HMO | Admitting: Internal Medicine

## 2017-05-15 ENCOUNTER — Telehealth: Payer: Self-pay | Admitting: Interventional Cardiology

## 2017-05-15 ENCOUNTER — Encounter: Payer: Self-pay | Admitting: Internal Medicine

## 2017-05-15 ENCOUNTER — Ambulatory Visit (INDEPENDENT_AMBULATORY_CARE_PROVIDER_SITE_OTHER)
Admission: RE | Admit: 2017-05-15 | Discharge: 2017-05-15 | Disposition: A | Discharge status: Home / Self Care | Payer: Medicare HMO | Source: Ambulatory Visit | Attending: Internal Medicine | Admitting: Internal Medicine

## 2017-05-15 VITALS — BP 124/72 | HR 77 | Ht 71.5 in | Wt 344.4 lb

## 2017-05-15 DIAGNOSIS — F5101 Primary insomnia: Secondary | ICD-10-CM

## 2017-05-15 DIAGNOSIS — G4733 Obstructive sleep apnea (adult) (pediatric): Secondary | ICD-10-CM

## 2017-05-15 DIAGNOSIS — F17200 Nicotine dependence, unspecified, uncomplicated: Secondary | ICD-10-CM

## 2017-05-15 DIAGNOSIS — R0609 Other forms of dyspnea: Secondary | ICD-10-CM | POA: Diagnosis not present

## 2017-05-15 DIAGNOSIS — R0602 Shortness of breath: Secondary | ICD-10-CM

## 2017-05-15 NOTE — Telephone Encounter (Signed)
Patient calling and states that he has had increasing SOB over the last few months. Patient states that he was seen by his pulmonologist today for his yearly checkup and a chest xray was ordered. Patient states that the CXR results stated he had  "Cardiomegaly with mild pulmonary interstitial prominence. Mild CHF cannot be excluded". Patient calling to schedule his yearly follow up appointment due in September and wants to know if he needs to be seen sooner.

## 2017-05-15 NOTE — Telephone Encounter (Signed)
Patient made aware that we will draw BMET and BNP. Labs ordered and appointment made for Friday 05/17/17. Patient verbalized understanding.

## 2017-05-15 NOTE — Telephone Encounter (Signed)
New message    Pt is calling to talk to RN about a chest xray he had done today. He would like a call back.

## 2017-05-15 NOTE — Assessment & Plan Note (Signed)
He has noted more dyspnea on first lying down recently. I suspect this is orthopnea rather than inadequate CPAP pressure. Plan-elevate head of bed with bricks, increase CPAP starting pressure: 10-15, weight loss.

## 2017-05-15 NOTE — Assessment & Plan Note (Signed)
Conservative use occasionally-OTC antihistamine based sleep med to start if needed. Sleep hygiene discussed.

## 2017-05-15 NOTE — Assessment & Plan Note (Signed)
He reports he has finally stop cigar smoking as of 6 months ago and is strongly encouraged to stay off.

## 2017-05-15 NOTE — Telephone Encounter (Signed)
Lets order a BMet and BNP for shortness of breath.

## 2017-05-15 NOTE — Patient Instructions (Addendum)
Order- office spirometry     Dx dyspnea on exertion  Order- DME Advanced- please change auto pap range to 10-15, continue mask of choice, humidifier, supplies, AirView   Dx OSA  Order- CXR    Dx dyspnea on exertion  We suggested you try elevating the head end of your bed frame by putting a brick under each of the head legs of the bed. See if this helps with breathing comfort as you lie down. Getting your weight down will also help your breathing.

## 2017-05-15 NOTE — Assessment & Plan Note (Addendum)
Most obvious problem is deconditioning with morbid obesity and obesity hypoventilation. PFT indicates restrictive component again likely from his obesity. There may be a small airway obstruction which would require formal PFT for clarification. His complaint is primarily at bedtime with orthopnea. Plan-weight loss. Elevate head of bed with bricks. See how he feels with tightened auto Pap range to 10-15.

## 2017-05-15 NOTE — Progress Notes (Signed)
Patient ID: Gordon Madden, male    DOB: [birth year], 66 y.o.   MRN: [id number]  HPI male cigar smoker followed for OSA, insomnia, rhinitis, complicated by DM, HBP, CAD/MI, CVA/stroke NPSG 07/25/2010 AHI 14.9 per hour Office Spirometry 05/15/17-moderate restriction of exhaled volume and at least mild obstruction. FVC 2.66/55%, FEV1 2.07/58%, ratio 0.78, FEF 25-75% 1.91/69% ------------------------------------------------------------------------------------------------  05/04/15- 62 yoM Cigar smoker followed for hypersomnia with sleep apnea, insomnia, rhinitis, complicated by DM, HBP, CAD/MI, CVA/stroke  Reports: Pt. wears CPAP 10/ Advanced for about 6-7 hours nightly, no complaints about mask and tubing, pt. feels fairly well Still smokes a cigar a day and says he gained weight when he tried to stop. Pending left TKR Insomnia-rare need for temazepam but helpful when used.  05/15/2016-66 year old male cigar smoker followed for OSA, insomnia, rhinitis, complicated by DM, HBP, CAD/MI, CVA/stroke CPAP 10/Advanced FOLLOWS FOR:DME:[facility]. Pt states he wears CPAP nightly about 7 hours; no SD card in CPAP and not in Airview-will need order to [facility] for SD card and DL. Admits weight gain on recent trip to [place]. He change to nasal pillows mask. Pressure is quite comfortable. He has skipped using machine for a week or 2 at a time on a couple of trips-discussed.  05/15/17- 66 year old male former smoker followed for OSA, insomnia, rhinitis, complicated by DM, HBP, CAD/MI, CVA/stroke CPAP  Auto 5-15/Advanced 1 yr for OSA. States that he has some SOB right after putting the machine on but it goes away. Uses [facility] as DME Body weight today 344 pounds      finally quit cigar smoking in January Download 77% 4 hour, AHI 0.3/hour. He had missed a couple of nights and had some short nights just in the last week. Mild dyspnea as he first lies down, whether he has mask on or not. Feels "not enough air initially". Fine later  in the night. Also more short of breath during the day without significant cough or wheeze. Office Spirometry 05/15/17-moderate restriction of exhaled volume and at least mild obstruction. FVC 2.66/55%, FEV1 2.07/58%, ratio 0.78, FEF 25-75% 1.91/69%.  Review of Systems-see HPI Constitutional:   No-   weight loss, night sweats, fevers, chills, fatigue, lassitude. HEENT:   No-  headaches, difficulty swallowing, tooth/dental problems, sore throat,      No-sneezing, itching, ear ache, nasal congestion, post nasal drip,  CV:  +chest pain, orthopnea, PND, swelling in lower extremities, anasarca, dizziness, palpitations Resp: +  shortness of breath with exertion or at rest.              No-   productive cough,  No non-productive cough,  No-  coughing up of blood.              No-   change in color of mucus.  No- wheezing.   Skin: No-   rash or lesions. GI:  No-   heartburn, indigestion, abdominal pain, +nausea,  GU:  MS:  +joint pain or swelling. . Neuro- nothing unusual  Psych:  No- change in mood or affect. No depression or anxiety.  No memory loss.  Objective:   Physical Exam General- Alert, Oriented, Affect-appropriate, Distress- none acute;  + Morbidly obese Skin- + cool, clammy Lymphadenopathy- none Head- atraumatic            Eyes- Gross vision intact, PERRLA, conjunctivae clear secretions            Ears- Hearing, canals normal            Nose-  Clear, Septal dev- not apparent from anterior., mucus, polyps, erosion, perforation             Throat- Mallampati III , mucosa clear , drainage- none, tonsils- atrophic Neck- flexible , trachea midline, no stridor , thyroid nl, carotid no bruit Chest - symmetrical excursion , unlabored           Heart/CV- RRR , no murmur , no gallop  , no rub, nl s1 s2                           - JVD- none , edema- none, stasis changes+, varices- none           Lung- clear to P&A, wheeze- none, cough- none , dullness-none, rub- none           Chest wall-   Abd-  Br/ Gen/ Rectal- Not done, not indicated Extrem- cyanosis- none, clubbing, none, atrophy- none, strength- nl, + surgical scar left knee Neuro- grossly intact to observation

## 2017-05-17 ENCOUNTER — Other Ambulatory Visit: Payer: Medicare HMO | Admitting: *Deleted

## 2017-05-17 DIAGNOSIS — R0602 Shortness of breath: Secondary | ICD-10-CM

## 2017-05-17 DIAGNOSIS — G4710 Hypersomnia, unspecified: Secondary | ICD-10-CM | POA: Diagnosis not present

## 2017-05-17 DIAGNOSIS — Z96651 Presence of right artificial knee joint: Secondary | ICD-10-CM | POA: Diagnosis not present

## 2017-05-17 DIAGNOSIS — G4733 Obstructive sleep apnea (adult) (pediatric): Secondary | ICD-10-CM | POA: Diagnosis not present

## 2017-05-17 DIAGNOSIS — M6281 Muscle weakness (generalized): Secondary | ICD-10-CM | POA: Diagnosis not present

## 2017-05-17 LAB — BASIC METABOLIC PANEL
BUN/Creatinine Ratio: 20 (ref 10–24)
BUN: 16 mg/dL (ref 8–27)
CALCIUM: 9 mg/dL (ref 8.6–10.2)
CO2: 25 mmol/L (ref 20–29)
CREATININE: 0.81 mg/dL (ref 0.76–1.27)
Chloride: 102 mmol/L (ref 96–106)
GFR calc Af Amer: 106 mL/min/{1.73_m2} (ref >59)
GFR, EST NON AFRICAN AMERICAN: 92 mL/min/{1.73_m2} (ref >59)
Glucose: 155 mg/dL — ABNORMAL HIGH (ref 65–99)
Potassium: 4.7 mmol/L (ref 3.5–5.2)
Sodium: 141 mmol/L (ref 134–144)

## 2017-05-17 LAB — PRO B NATRIURETIC PEPTIDE: NT-Pro BNP: 235 pg/mL (ref 0–376)

## 2017-05-21 ENCOUNTER — Other Ambulatory Visit: Payer: Self-pay

## 2017-05-21 MED ORDER — FUROSEMIDE 40 MG PO TABS: 40.0000 mg | ORAL_TABLET | Freq: Every day | ORAL | 3 refills | Status: DC | PRN

## 2017-05-21 MED ORDER — POTASSIUM CHLORIDE CRYS ER 20 MEQ PO TBCR: 20.0000 meq | EXTENDED_RELEASE_TABLET | Freq: Every day | ORAL | 3 refills | Status: DC | PRN

## 2017-05-28 NOTE — Telephone Encounter (Signed)
Close encounter 

## 2017-06-05 DIAGNOSIS — M1711 Unilateral primary osteoarthritis, right knee: Secondary | ICD-10-CM | POA: Diagnosis not present

## 2017-06-05 DIAGNOSIS — M25562 Pain in left knee: Secondary | ICD-10-CM | POA: Diagnosis not present

## 2017-06-16 DIAGNOSIS — Z96651 Presence of right artificial knee joint: Secondary | ICD-10-CM | POA: Diagnosis not present

## 2017-06-16 DIAGNOSIS — M6281 Muscle weakness (generalized): Secondary | ICD-10-CM | POA: Diagnosis not present

## 2017-06-16 DIAGNOSIS — G4733 Obstructive sleep apnea (adult) (pediatric): Secondary | ICD-10-CM | POA: Diagnosis not present

## 2017-06-16 DIAGNOSIS — G4710 Hypersomnia, unspecified: Secondary | ICD-10-CM | POA: Diagnosis not present

## 2017-06-27 ENCOUNTER — Telehealth: Payer: Self-pay | Admitting: Medical Oncology

## 2017-06-27 NOTE — Telephone Encounter (Signed)
Pt asking for appt for medical clearance from [facility] for knee surgery 08/06/17. Last office visit 2016. PCP DR Lady Deutscher.

## 2017-06-29 NOTE — Telephone Encounter (Signed)
OK to come and see Cyril Mourning and me for clearance because of low platelets. CBC before visit.

## 2017-07-02 ENCOUNTER — Telehealth: Payer: Self-pay | Admitting: Internal Medicine

## 2017-07-02 ENCOUNTER — Other Ambulatory Visit: Payer: Self-pay | Admitting: Medical Oncology

## 2017-07-02 DIAGNOSIS — D696 Thrombocytopenia, unspecified: Secondary | ICD-10-CM

## 2017-07-02 NOTE — Telephone Encounter (Signed)
Scheduled appt per sch message from [place] - patient is aware of appt time and date.

## 2017-07-09 ENCOUNTER — Encounter: Payer: Self-pay | Admitting: Physician Assistant

## 2017-07-09 DIAGNOSIS — I429 Cardiomyopathy, unspecified: Secondary | ICD-10-CM | POA: Diagnosis not present

## 2017-07-09 DIAGNOSIS — E782 Mixed hyperlipidemia: Secondary | ICD-10-CM | POA: Diagnosis not present

## 2017-07-09 DIAGNOSIS — I119 Hypertensive heart disease without heart failure: Secondary | ICD-10-CM | POA: Diagnosis not present

## 2017-07-09 DIAGNOSIS — Z0000 Encounter for general adult medical examination without abnormal findings: Secondary | ICD-10-CM | POA: Diagnosis not present

## 2017-07-09 DIAGNOSIS — I2510 Atherosclerotic heart disease of native coronary artery without angina pectoris: Secondary | ICD-10-CM | POA: Diagnosis not present

## 2017-07-09 DIAGNOSIS — D696 Thrombocytopenia, unspecified: Secondary | ICD-10-CM | POA: Diagnosis not present

## 2017-07-09 DIAGNOSIS — Z125 Encounter for screening for malignant neoplasm of prostate: Secondary | ICD-10-CM | POA: Diagnosis not present

## 2017-07-09 DIAGNOSIS — N181 Chronic kidney disease, stage 1: Secondary | ICD-10-CM | POA: Diagnosis not present

## 2017-07-09 DIAGNOSIS — E1121 Type 2 diabetes mellitus with diabetic nephropathy: Secondary | ICD-10-CM | POA: Diagnosis not present

## 2017-07-09 NOTE — Progress Notes (Signed)
Cardiology Office Note    Date:  07/10/2017  ID:  Gordon Madden, DOB [birth year], MRN [id number] PCP:  Kelton Pillar, MD  Cardiologist:  Dr. Irish Lack   Chief Complaint: pre-op evaluation  History of Present Illness:  Gordon Madden is a 66 y.o. male with history of CAD, chronic systolic CHF with EF 32-99%, diabetes mellitus, CVA, gout, HTN, HLD, OSA, BPH, morbid obesity, chronic DOE, chronic thrombocytopenia, arthritis, mildly dilated aorta who presents for pre-op evaluation.  Per cardiology notes, he has a history of prior MI in 2004 and was found to have a totally occluded RCA at [facility] in [place]. His wife was pursuing her doctorate at [employer] at that time. He is originally from the [place] area. We do not have these records. Last stress Myoview 06/25/14 was low risk nuclear study with large inferolateral scar and minimal peri-infarct ischemia, EF 35% with inferolateral akinesis/mild dyskinesis. This was felt to be low risk by Dr. Irish Lack. 2D Echo 05/2014 showed limited quality images but there appears to bebasal and mid inferior and inferolateral hypokinesis, EF 40-45%, no RWMA, mild AI, mildly dilated ascending aorta, no MR, mild TR. He was last seen in 2017 by Dr. Clayton Bibles. There are also phone notes from June 2018 indicating patient having SOB, with CXR showing mild pulmonary interstitial prominence; mild CHF cannot be excluded. Result came to pulmonary. Labs showed normal pBNP 235, K 4.7, CR 0.81 - Dr. Irish Lack recommended he try Lasix 40mg  daily PRN along with potassium. No recent CBC on file but the patient states he had his general wellness check yesterday by PCP with labs, which he does not think are back yet.  Overall he feels he's "maintaining" from a cardiac standpoint but does report feeling about 10% more SOB over the last year than prior. He gets dyspneic walking to the mailbox which is somewhat new from prior, but he also has really cut back significantly on  activity. His right knee arthritis has caused him to be quite sedentary. He's not been really sticking to a plan to lose weight but knows he needs to. He's not had any chest pain. He has chronic venous stasis changes. He reports compliance with CPAP. He reports he's gained about 30 lbs in 2 years. He previously reduced his carvedilol to once daily due to fatigue. His BP has been running higher recently, recheck 152/92 by me. He took AM meds today. He only had taken the Lasix above for 1 day due to inability to tolerate frequent urination. He is pending right knee surgery in 07/2017.   Past Medical History:  Diagnosis Date  . Arthritis   . Ascending aorta dilatation (HCC)   . BPH (benign prostatic hyperplasia)   . CAD (coronary artery disease)    a. MI 2004 with occ RCA.  . Cardiomyopathy    EF 40%  . Chronic systolic CHF (congestive heart failure) ([place])   . Colon polyps   . CVA (cerebral infarction)    Renal artery branch occlusion  . Diabetes mellitus, type 2 ([place])   . History of gout   . Hyperlipidemia   . Hypertension   . Idiopathic thrombocytopenia ([place])   . Morbid obesity ([place])   . Myocardial infarction ([place])   . OSA (obstructive sleep apnea)    07/25/10 AHI 14.9/hr  . PONV (postoperative nausea and vomiting)   . Primary localized osteoarthritis of left knee 06/07/2015  . Rhinosinusitis   . Shortness of breath dyspnea   .  Vision decreased     Past Surgical History:  Procedure Laterality Date  . CARDIAC CATHETERIZATION  2005   no stents  . CYST EXCISION     chest and back  . HERNIA REPAIR     umbilical  . KNEE CARTILAGE SURGERY     left  . MENISCUS REPAIR Left   . NASAL SINUS SURGERY    . SINUS IRRIGATION    . TOTAL KNEE ARTHROPLASTY Left 06/07/2015   Procedure: TOTAL KNEE ARTHROPLASTY;  Surgeon: [name], MD;  Location: [facility];  Service: Orthopedics;  Laterality: Left;  . UMBILICAL HERNIA REPAIR      Current Medications: Current Meds  Medication Sig  .  Armodafinil 150 MG tablet Take 1 tablet (150 mg total) by mouth daily as needed.  [name] aspirin EC 81 MG tablet Take 81 mg by mouth daily.  . carvedilol (COREG) 6.25 MG tablet Take 6.25 mg by mouth daily.  . clopidogrel (PLAVIX) 75 MG tablet Take 75 mg by mouth daily.  . Coenzyme Q10 (CO Q 10 PO) Take 1 tablet by mouth daily.  . fexofenadine (ALLEGRA) 180 MG tablet Take 180 mg by mouth daily.  . folic acid (FOLVITE) 824 MCG tablet Take 400 mcg by mouth daily.  [name] lisinopril (PRINIVIL,ZESTRIL) 10 MG tablet Take 10 mg by mouth daily.  . metFORMIN (GLUCOPHAGE) 500 MG tablet Take 1,000 mg by mouth daily with breakfast.   . potassium chloride SA (K-DUR,KLOR-CON) 20 MEQ tablet Take 1 tablet (20 mEq total) by mouth daily as needed (with furosemide only).  . pravastatin (PRAVACHOL) 40 MG tablet Take 40 mg by mouth daily.     Allergies:   Plavix [clopidogrel]   Social History   Social History  . Marital status: Married    Spouse name: N/A  . Number of children: N/A  . Years of education: N/A   Occupational History  . 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 Smoking status: Former Smoker    Packs/day: 3.00    Years: 20.00    Types: Cigars, Cigarettes  . Smokeless tobacco: Never Used     Comment: stopped smoking cigarettes in 1991, smoked cigars from 2000 to Jan 2018, 1 cigar per day  . Alcohol use Yes     Comment: 5 drinks a week  . Drug use: No  . Sexual activity: Not Asked   Other Topics Concern  . None   Social History Narrative   ** Merged History Encounter **         Family History:  Family History  Problem Relation Age of Onset  . Hypertension Father   . Colon cancer Neg Hx   . Heart attack Neg Hx     ROS:   Please see the history of present illness.  All other systems are reviewed and otherwise negative.    PHYSICAL EXAM:   VS:  BP (!) 148/102   Pulse 71   Ht 5' 11.5" (1.816 m)   Wt (!) 342 lb (155.1 kg)   SpO2 96%   BMI  47.03 kg/m   BMI: Body mass index is 44.56 kg/m. GEN: Well nourished, well developed morbidly obese, in no acute distress  HEENT: normocephalic, atraumatic Neck: no JVD, carotid bruits, or masses Cardiac: RRR; no murmurs, rubs, or gallops, trace BLE edema superimposed on large leg habitus Respiratory:  clear to auscultation bilaterally, normal work of breathing GI: soft, nontender, nondistended, + BS MS: no deformity or atrophy  Skin: warm and dry, no rash Neuro:  Alert and Oriented x 3, Strength and sensation are intact, follows commands Psych: euthymic mood, full affect  Wt Readings from Last 3 Encounters:  07/10/17 (!) 342 lb (155.1 kg)  05/15/17 (!) 344 lb 6.4 oz (156.2 kg)  02/04/17 (!) 338 lb 9.6 oz (153.6 kg)      Studies/Labs Reviewed:   EKG:  EKG was ordered today and personally reviewed by me and demonstrates NSR 71bpm, 1st degree AVB, low voltage QRS, LAFB, cannot rule out prior inferior infarct, possible prior anterolateral infarct. Similar to prior.  Recent Labs: 05/17/2017: BUN 16; Creatinine, Ser 0.81; NT-Pro BNP 235; Potassium 4.7; Sodium 141   Lipid Panel No results found for: CHOL, TRIG, HDL, CHOLHDL, VLDL, LDLCALC, LDLDIRECT  Additional studies/ records that were reviewed today include: Summarized above.    ASSESSMENT & PLAN:   1. Shortness of breath - suspect primarily due to sedentary lifestyle, morbid obesity, and ongoing weight gain. Recent BNP was normal and he did not tolerate Lasix. Could also be due to poorly controlled blood pressure. He is amenable to trying carvedilol BID. I do not think this will get him to goal BP 120/80 so will also increase lisinopril to 20mg  daily. Will ask nurse to request copy of labs from PCP for our review as he also has h/o anemia. He previously required updated echo/nuc prior to knee surgery in 2015. Given his progressive dyspnea I feel this is indicated to assess for any significant changes. Asked him to please call our  office if he notices BP still running >297 systolic on increased doses of meds (he has cuff at home). 2. Pre-operative evaluation - see above.  Await echo and nuc. Discussed increased risk overall due to his morbid obesity - reviewed calorie in/calorie out concept. 3. CAD - he is maintained on ASA/Plavix by primary cardiologist. Titrate BB. Continue statin. Decision re: statin titration per primary cardiologist. 4. Chronic systolic CHF - recent BNP normal; his weight has been about the same since 2017 but increased from 2016. Discussed 2g sodium, 2L fluid restriction, daily weights. If LVEF remains low, would recommend consideration of initiation of spironolactone versus switch from ACEI to [facility]. 5. Dilated aorta - f/u with echo.  Disposition: F/u with Dr. Roney Marion team APP afterabove testing.   Medication Adjustments/Labs and Tests Ordered: Current medicines are reviewed at length with the patient today.  Concerns regarding medicines are outlined above. Medication changes, Labs and Tests ordered today are summarized above and listed in the Patient Instructions accessible in Encounters.   Signed, Charlie Pitter, PA-C  07/10/2017 9:21 AM    [address], [place], [place]  [address] Phone: [phone]; Fax: [phone]

## 2017-07-10 ENCOUNTER — Encounter: Payer: Self-pay | Admitting: Physician Assistant

## 2017-07-10 ENCOUNTER — Ambulatory Visit (INDEPENDENT_AMBULATORY_CARE_PROVIDER_SITE_OTHER): Payer: Medicare HMO | Admitting: Physician Assistant

## 2017-07-10 VITALS — BP 148/102 | HR 71 | Ht 71.5 in | Wt 342.0 lb

## 2017-07-10 DIAGNOSIS — I2510 Atherosclerotic heart disease of native coronary artery without angina pectoris: Secondary | ICD-10-CM | POA: Diagnosis not present

## 2017-07-10 DIAGNOSIS — I5022 Chronic systolic (congestive) heart failure: Secondary | ICD-10-CM | POA: Diagnosis not present

## 2017-07-10 DIAGNOSIS — Z01810 Encounter for preprocedural cardiovascular examination: Secondary | ICD-10-CM

## 2017-07-10 DIAGNOSIS — R0602 Shortness of breath: Secondary | ICD-10-CM | POA: Diagnosis not present

## 2017-07-10 DIAGNOSIS — I77810 Thoracic aortic ectasia: Secondary | ICD-10-CM

## 2017-07-10 MED ORDER — CARVEDILOL 6.25 MG PO TABS: 6.2500 mg | ORAL_TABLET | Freq: Two times a day (BID) | ORAL | 11 refills | Status: DC

## 2017-07-10 MED ORDER — LISINOPRIL 20 MG PO TABS: 20.0000 mg | ORAL_TABLET | Freq: Every day | ORAL | 3 refills | Status: DC

## 2017-07-10 NOTE — Patient Instructions (Signed)
Medication Instructions:  Your physician has recommended you make the following change in your medication: 1.) START taking the Carvedilol twice a day. 2.) START taking Lisinopril 20 mg daily.   Labwork: None Ordered   Testing/Procedures: Your physician has requested that you have an echocardiogram. Echocardiography is a painless test that uses sound waves to create images of your heart. It provides your doctor with information about the size and shape of your heart and how well your heart's chambers and valves are working. This procedure takes approximately one hour. There are no restrictions for this procedure.  Your physician has requested that you have a lexiscan myoview. For further information please visit [email or web address]. Please follow instruction sheet, as given.    Follow-Up: Your physician recommends that you schedule a follow-up appointment in: follow up after testing with Dr. Irish Lack or an APP on his team.   Any Other Special Instructions Will Be Listed Below (If Applicable).  Please call us if your blood pressure continues to rise about 130 on the top number.    If you need a refill on your cardiac medications before your next appointment, please call your pharmacy.

## 2017-07-10 NOTE — Addendum Note (Signed)
Addended by: Della Goo C on: 07/10/2017 10:25 AM   Modules accepted: Orders

## 2017-07-15 ENCOUNTER — Telehealth (HOSPITAL_COMMUNITY): Payer: Self-pay | Admitting: *Deleted

## 2017-07-15 NOTE — Telephone Encounter (Signed)
Patient given detailed instructions per Myocardial Perfusion Study Information Sheet for the test on  07/17/17 Patient notified to arrive 15 minutes early and that it is imperative to arrive on time for appointment to keep from having the test rescheduled.  If you need to cancel or reschedule your appointment, please call the office within 24 hours of your appointment. . Patient verbalized understanding. Kirstie Peri

## 2017-07-17 ENCOUNTER — Ambulatory Visit (HOSPITAL_COMMUNITY): Payer: Medicare HMO | Attending: Cardiology

## 2017-07-17 DIAGNOSIS — Z96651 Presence of right artificial knee joint: Secondary | ICD-10-CM | POA: Diagnosis not present

## 2017-07-17 DIAGNOSIS — R0602 Shortness of breath: Secondary | ICD-10-CM | POA: Diagnosis not present

## 2017-07-17 DIAGNOSIS — G4733 Obstructive sleep apnea (adult) (pediatric): Secondary | ICD-10-CM | POA: Diagnosis not present

## 2017-07-17 DIAGNOSIS — I519 Heart disease, unspecified: Secondary | ICD-10-CM | POA: Insufficient documentation

## 2017-07-17 DIAGNOSIS — G4710 Hypersomnia, unspecified: Secondary | ICD-10-CM | POA: Diagnosis not present

## 2017-07-17 DIAGNOSIS — M6281 Muscle weakness (generalized): Secondary | ICD-10-CM | POA: Diagnosis not present

## 2017-07-17 MED ORDER — REGADENOSON 0.4 MG/5ML IV SOLN
0.4000 mg | Freq: Once | INTRAVENOUS | Status: AC
  Administered 2017-07-17: 0.4 mg via INTRAVENOUS

## 2017-07-17 MED ORDER — TECHNETIUM TC 99M TETROFOSMIN IV KIT
32.5000 | PACK | Freq: Once | INTRAVENOUS | Status: AC | PRN
  Administered 2017-07-17: 32.5 via INTRAVENOUS
  Filled 2017-07-17: qty 33

## 2017-07-18 ENCOUNTER — Ambulatory Visit (HOSPITAL_COMMUNITY): Payer: Medicare HMO | Attending: Physician Assistant

## 2017-07-18 ENCOUNTER — Other Ambulatory Visit: Payer: Self-pay

## 2017-07-18 ENCOUNTER — Ambulatory Visit (HOSPITAL_BASED_OUTPATIENT_CLINIC_OR_DEPARTMENT_OTHER): Payer: Medicare HMO

## 2017-07-18 DIAGNOSIS — Z01810 Encounter for preprocedural cardiovascular examination: Secondary | ICD-10-CM | POA: Diagnosis not present

## 2017-07-18 DIAGNOSIS — R0602 Shortness of breath: Secondary | ICD-10-CM | POA: Diagnosis not present

## 2017-07-18 DIAGNOSIS — I2510 Atherosclerotic heart disease of native coronary artery without angina pectoris: Secondary | ICD-10-CM | POA: Insufficient documentation

## 2017-07-18 DIAGNOSIS — I509 Heart failure, unspecified: Secondary | ICD-10-CM | POA: Insufficient documentation

## 2017-07-18 DIAGNOSIS — I5022 Chronic systolic (congestive) heart failure: Secondary | ICD-10-CM

## 2017-07-18 DIAGNOSIS — E669 Obesity, unspecified: Secondary | ICD-10-CM | POA: Insufficient documentation

## 2017-07-18 DIAGNOSIS — I77810 Thoracic aortic ectasia: Secondary | ICD-10-CM | POA: Insufficient documentation

## 2017-07-18 LAB — MYOCARDIAL PERFUSION IMAGING
CHL CUP RESTING HR STRESS: 77 {beats}/min
LHR: 0.32
LV sys vol: 119 mL
LVDIAVOL: 186 mL (ref 62–150)
NUC STRESS TID: 1.13
Peak HR: 83 {beats}/min
SDS: 3
SRS: 19
SSS: 22

## 2017-07-18 MED ORDER — TECHNETIUM TC 99M TETROFOSMIN IV KIT
33.0000 | PACK | Freq: Once | INTRAVENOUS | Status: AC | PRN
  Administered 2017-07-18: 33 via INTRAVENOUS
  Filled 2017-07-18: qty 33

## 2017-07-18 MED ORDER — PERFLUTREN LIPID MICROSPHERE
1.0000 mL | INTRAVENOUS | Status: AC | PRN
  Administered 2017-07-18: 2 mL via INTRAVENOUS

## 2017-07-25 ENCOUNTER — Other Ambulatory Visit: Payer: Self-pay | Admitting: Orthopedic Surgery

## 2017-07-25 NOTE — Pre-Procedure Instructions (Signed)
Gordon Madden  07/25/2017      [facility] [address], [place] [name] [place] [address] Phone: [phone] Fax: [phone]  [facility] [place], [place] [address] Phone: [phone] Fax: [phone]    Your procedure is scheduled on Tuesday, August 06, 2017  Report to [facility] Admitting Entrance "A" at 5:30 A.M.   Call this number if you have problems the morning of surgery:  [phone]   Remember:  Do not eat food or drink liquids after midnight on August 05, 2017  Take these medicines the morning of surgery with A SIP OF WATER: Carvedilol (COREG) and Fexofenadine (ALLEGRA).  7 days before surgery stop taking all Aspirins, Vitamins, Fish oils, and Herbal medications. Also stop all NSAIDS i.e. Advil, Motrin, Aleve, Anaprox, Naproxen, BC and Goody Powders.  How to Manage Your Diabetes Before and After Surgery  Why is it important to control my blood sugar before and after surgery? . Improving blood sugar levels before and after surgery helps healing and can limit problems. . A way of improving blood sugar control is eating a healthy diet by: o  Eating less sugar and carbohydrates o  Increasing activity/exercise o  Talking with your doctor about reaching your blood sugar goals . High blood sugars (greater than 180 mg/dL) can raise your risk of infections and slow your recovery, so you will need to focus on controlling your diabetes during the weeks before surgery. . Make sure that the doctor who takes care of your diabetes knows about your planned surgery including the date and location.  How do I manage my blood sugar before surgery? . Check your blood sugar at least 4 times a day, starting 2 days before surgery, to make sure that the level is not too high or low. o Check your blood sugar the morning of your surgery when you wake up and  every 2 hours until you get to the Short Stay unit. . If your blood sugar is less than 70 mg/dL, you will need to treat for low blood sugar: o Do not take insulin. o Treat a low blood sugar (less than 70 mg/dL) with  cup of clear juice (cranberry or apple), 4 glucose tablets, OR glucose gel. o Recheck blood sugar in 15 minutes after treatment (to make sure it is greater than 70 mg/dL). If your blood sugar is not greater than 70 mg/dL on recheck, call [phone] for further instructions. . Report your blood sugar to the short stay nurse when you get to Short Stay.  . If you are admitted to the hospital after surgery: o Your blood sugar will be checked by the staff and you will probably be given insulin after surgery (instead of oral diabetes medicines) to make sure you have good blood sugar levels. o The goal for blood sugar control after surgery is 80-180 mg/dL.  WHAT DO I DO ABOUT MY DIABETES MEDICATION?   [name] Do not take MetFORMIN (GLUCOPHAGE) the morning of surgery.   Do not wear jewelry.  Do not wear lotions, powders, or perfumes, or deodorant.  Do not shave 48 hours prior to surgery.  Men may shave face and neck.  Do not bring valuables to the hospital.  [facility] is not responsible for any belongings or valuables.  Contacts, dentures or bridgework may not be worn into surgery.  Leave your suitcase  in the car.  After surgery it may be brought to your room.  For patients admitted to the hospital, discharge time will be determined by your treatment team.  Patients discharged the day of surgery will not be allowed to drive home.   Special instructions:  [facility]- Preparing For Surgery  Before surgery, you can play an important role. Because skin is not sterile, your skin needs to be as free of germs as possible. You can reduce the number of germs on your skin by washing with CHG (chlorahexidine gluconate) Soap before surgery.  CHG is an antiseptic cleaner which kills germs and  bonds with the skin to continue killing germs even after washing.  Please do not use if you have an allergy to CHG or antibacterial soaps. If your skin becomes reddened/irritated stop using the CHG.  Do not shave (including legs and underarms) for at least 48 hours prior to first CHG shower. It is OK to shave your face.  Please follow these instructions carefully.   1. Shower the NIGHT BEFORE SURGERY and the MORNING OF SURGERY with CHG.   2. If you chose to wash your hair, wash your hair first as usual with your normal shampoo.  3. After you shampoo, rinse your hair and body thoroughly to remove the shampoo.  4. Use CHG as you would any other liquid soap. You can apply CHG directly to the skin and wash gently with a scrungie or a clean washcloth.   5. Apply the CHG Soap to your body ONLY FROM THE NECK DOWN.  Do not use on open wounds or open sores. Avoid contact with your eyes, ears, mouth and genitals (private parts). Wash genitals (private parts) with your normal soap.  6. Wash thoroughly, paying special attention to the area where your surgery will be performed.  7. Thoroughly rinse your body with warm water from the neck down.  8. DO NOT shower/wash with your normal soap after using and rinsing off the CHG Soap.  9. Pat yourself dry with a CLEAN TOWEL.   10. Wear CLEAN PAJAMAS   11. Place CLEAN SHEETS on your bed the night of your first shower and DO NOT SLEEP WITH PETS.  Day of Surgery: Do not apply any deodorants/lotions. Please wear clean clothes to the hospital/surgery center.    Please read over the following fact sheets that you were given. Pain Booklet, Coughing and Deep Breathing, MRSA Information and Surgical Site Infection Prevention

## 2017-07-26 ENCOUNTER — Encounter (HOSPITAL_COMMUNITY)
Admission: RE | Admit: 2017-07-26 | Discharge: 2017-07-26 | Disposition: A | Discharge status: Home / Self Care | Payer: Medicare HMO | Source: Ambulatory Visit | Attending: Orthopedic Surgery | Admitting: Orthopedic Surgery

## 2017-07-26 ENCOUNTER — Encounter (HOSPITAL_COMMUNITY): Payer: Self-pay

## 2017-07-26 DIAGNOSIS — E119 Type 2 diabetes mellitus without complications: Secondary | ICD-10-CM | POA: Diagnosis not present

## 2017-07-26 DIAGNOSIS — Z01818 Encounter for other preprocedural examination: Secondary | ICD-10-CM | POA: Diagnosis not present

## 2017-07-26 HISTORY — DX: Pneumonia, unspecified organism: J18.9

## 2017-07-26 LAB — CBC
HCT: 45.4 % (ref 39.0–52.0)
HEMOGLOBIN: 15.4 g/dL (ref 13.0–17.0)
MCH: 32.2 pg (ref 26.0–34.0)
MCHC: 33.9 g/dL (ref 30.0–36.0)
MCV: 95 fL (ref 78.0–100.0)
PLATELETS: 88 10*3/uL — AB (ref 150–400)
RBC: 4.78 MIL/uL (ref 4.22–5.81)
RDW: 13.3 % (ref 11.5–15.5)
WBC: 6.2 10*3/uL (ref 4.0–10.5)

## 2017-07-26 LAB — BASIC METABOLIC PANEL
ANION GAP: 8 (ref 5–15)
BUN: 14 mg/dL (ref 6–20)
CALCIUM: 9.1 mg/dL (ref 8.9–10.3)
CO2: 26 mmol/L (ref 22–32)
CREATININE: 0.86 mg/dL (ref 0.61–1.24)
Chloride: 104 mmol/L (ref 101–111)
Glucose, Bld: 160 mg/dL — ABNORMAL HIGH (ref 65–99)
Potassium: 4.6 mmol/L (ref 3.5–5.1)
SODIUM: 138 mmol/L (ref 135–145)

## 2017-07-26 LAB — SURGICAL PCR SCREEN
MRSA, PCR: NEGATIVE
Staphylococcus aureus: NEGATIVE

## 2017-07-26 LAB — GLUCOSE, CAPILLARY: GLUCOSE-CAPILLARY: 147 mg/dL — AB (ref 65–99)

## 2017-07-26 NOTE — Progress Notes (Signed)
PCP - Dr. [facility]  Cardiologist - Dr. Irish Lack  Chest x-ray - 05/15/17 (E)  EKG - 07/10/17 (E)  Stress Test - 07/17/17 (E)  ECHO - 07/18/17 (E)  Cardiac Cath - 2005- No stents  PFT- 05/15/17 (E)  Doppler- 01/23/10-Carotid (E)  Sleep Study - Yes CPAP - Yes-setting of ?10  Fasting Blood Sugar - unknown, pt does not use a meter, today 147 Checks Blood Sugar ___0__ times a day  Chart will be given for anesthesia review due to cardiac history. Recent HA1C from 07/09/17 requested from Dr. Laurann Montana.  Pt denies having chest pain, sob, or fever at this time. All instructions explained to the pt, with a verbal understanding of the material. Pt agrees to go over the instructions while at home for a better understanding. The opportunity to ask questions was provided.

## 2017-07-30 ENCOUNTER — Other Ambulatory Visit (HOSPITAL_BASED_OUTPATIENT_CLINIC_OR_DEPARTMENT_OTHER): Payer: Medicare HMO

## 2017-07-30 ENCOUNTER — Ambulatory Visit (HOSPITAL_BASED_OUTPATIENT_CLINIC_OR_DEPARTMENT_OTHER): Payer: Medicare HMO | Admitting: Oncology

## 2017-07-30 ENCOUNTER — Encounter: Payer: Self-pay | Admitting: Oncology

## 2017-07-30 ENCOUNTER — Telehealth: Payer: Self-pay | Admitting: Oncology

## 2017-07-30 VITALS — BP 84/66 | HR 66 | Temp 98.9°F | Resp 20 | Ht 71.5 in | Wt 344.2 lb

## 2017-07-30 DIAGNOSIS — D696 Thrombocytopenia, unspecified: Secondary | ICD-10-CM

## 2017-07-30 DIAGNOSIS — D693 Immune thrombocytopenic purpura: Secondary | ICD-10-CM

## 2017-07-30 LAB — CBC WITH DIFFERENTIAL/PLATELET
BASO%: 0.2 % (ref 0.0–2.0)
BASOS ABS: 0 10*3/uL (ref 0.0–0.1)
EOS ABS: 0.4 10*3/uL (ref 0.0–0.5)
EOS%: 7.2 % — AB (ref 0.0–7.0)
HCT: 45.7 % (ref 38.4–49.9)
HEMOGLOBIN: 15.5 g/dL (ref 13.0–17.1)
LYMPH%: 20 % (ref 14.0–49.0)
MCH: 32.5 pg (ref 27.2–33.4)
MCHC: 33.9 g/dL (ref 32.0–36.0)
MCV: 95.8 fL (ref 79.3–98.0)
MONO#: 0.5 10*3/uL (ref 0.1–0.9)
MONO%: 8.7 % (ref 0.0–14.0)
NEUT#: 3.4 10*3/uL (ref 1.5–6.5)
NEUT%: 63.9 % (ref 39.0–75.0)
Platelets: 87 10*3/uL — ABNORMAL LOW (ref 140–400)
RBC: 4.77 10*6/uL (ref 4.20–5.82)
RDW: 13.3 % (ref 11.0–14.6)
WBC: 5.3 10*3/uL (ref 4.0–10.3)
lymph#: 1.1 10*3/uL (ref 0.9–3.3)

## 2017-07-30 NOTE — Progress Notes (Signed)
Anesthesia Chart Review:  Pt is a 66 year old male scheduled for R total knee arthroplasty on 08/06/2017 with [name], MD  - PCP is Kelton Pillar, MD  - Pulmonologist is Baird Lyons, MD  - Cardiologist is Larae Grooms, MD who cleared pt for surgery in comment on stress test results 07/17/17 noting "Given no ischemia on stress test, I think he can proceed with surgery. Avoid excessive IV fluids to prevent CHF exacerbation in the perioperative period"  - Hematologist is Curt Bears, MD who cleared pt for surgery at last office visit 07/30/17 with Mikey Bussing, NP noting "We do think the patient will have any major issues from a hematology standpoint if he proceeds with the left knee surgery was his current platelets count 87,000 or any platelets count to over 70,000"  PMH includes:  CAD (MI, totally occluded RCA 2004 in [place]), CHF, cardiomyopathy, CVA, ascending aorta dilatation (not seen on echo 07/18/17), HTN, DM, hyperlipidemia, idiopathic thrombocytopenia.  Former smoker. BMI 47. S/p L TKA 06/07/15.   Medications include: ASA 81mg , carvedilol, plavix, lisinopril, metformin, pravastatin. Last dose plavix 07/30/17.   BP (!) 136/57   Pulse 73   Temp 36.6 C (Oral)   Resp 19   Ht 5' 11.5" (1.816 m)   Wt (!) 336 lb 14.4 oz (152.8 kg)   SpO2 97%   BMI 46.33 kg/m    Preoperative labs reviewed.   - Platelets 87. This is consistent with prior results.  - Glucose 160. HbA1c was 7.3 on 07/09/17 at PCP's office.   CXR 05/15/17: Cardiomegaly with mild pulmonary interstitial prominence. Mild CHF cannot be excluded.  EKG 07/10/17: NSR 71bpm, 1st degree AVB, low voltage QRS, LAFB, cannot rule out prior inferior infarct, possible prior anterolateral infarct.  Echo 07/18/17:  - Left ventricle: The cavity size was moderately dilated. Wall thickness was normal. Systolic function was moderately reduced. The estimated ejection fraction was in the range of 35% to 40%. Global hypokinesis with  regional variation inferiorly. Doppler parameters are consistent with abnormal left ventricular relaxation (grade 1 diastolic dysfunction). The E/e&' ratio is between 8-15, suggesting indeterminate LV filling pressure. Ejection fraction (MOD, 2-plane): 37%. - Aortic valve: Trileaflet; mildly calcified leaflets. There was no stenosis. There was no regurgitation. - Aortic root: The aortic root was normal in size. Ascending aorta: The ascending aorta was normal in size. - Mitral valve: Calcified annulus. Mildly thickened leaflets. There was trivial regurgitation. - Left atrium: Moderately dilated. The atrium was mildly dilated. - Right atrium: Moderately dilated. - Inferior vena cava: The vessel was normal in size. The respirophasic diameter changes were in the normal range (>= 50%), consistent with normal central venous pressure.  Nuclear stress test 07/17/17:   Nuclear stress EF: 36%.  There was no ST segment deviation noted during stress. 1. EF 36% with basal/mid inferolateral and basal inferior hypokinesis.  2. Fixed large, severe basal inferior and basal to mid inferolateral perfusion defect.  This suggests infarction without significant ischemia.  - Intermediate risk study due to low EF.   If no changes, I anticipate pt can proceed with surgery as scheduled.   Willeen Cass, FNP-BC [facility]/Anesthesiology Phone: [phone] 07/30/2017 11:46 AM

## 2017-07-30 NOTE — Progress Notes (Signed)
[facility] Follow up:    Gordon Pillar, MD [address] [place] [address]   DIAGNOSIS: Chronic idiopathic thrombocytopenic purpura for several years and has been asymptomatic from his thrombocytopenia.   SUMMARY OF ONCOLOGIC HISTORY:  No history exists.    CURRENT THERAPY: observation  INTERVAL HISTORY: Gordon Madden 66 y.o. male returns for routine follow-up by himself. He is here for clearance from a hematology standpoint prior to having right knee surgery.The patient was last seen by [place] in June 2016. He reports that his platelet count has been in the 70-80,000 range. He is also on aspirin and Plavix. He denies having any significant bleeding, bruises ecchymosis. He has no significant complaints except for his right knee pain. Denies having any significant weight loss or night sweats. Denies chest pain and shortness of breath at rest. He does have some dyspnea on exertion. Denies cough and hemoptysis. Denies nausea, vomiting, constipation, diarrhea. Denies fevers and chills. The patient is here for evaluation of his thrombocytopenia prior to surgery which is planned for 08/06/2017.   Patient Active Problem List   Diagnosis Date Noted  . DOE (dyspnea on exertion) 05/15/2017  . Hypersomnia 02/04/2017  . Primary localized osteoarthritis of left knee 06/07/2015  . Thrombocytopenia ([place]) 05/09/2015  . Insomnia 05/09/2014  . Coronary atherosclerosis of native coronary artery 04/26/2014  . Obesity 04/26/2014  . Tobacco use disorder 04/26/2014  . Mitral valve disorder 04/26/2014  . RHINOSINUSITIS, CHRONIC 08/08/2010  . DM (diabetes mellitus) ([place]) 07/02/2010  . Essential hypertension 07/02/2010  . Old MI (myocardial infarction) 07/02/2010  . C V A / STROKE 07/02/2010  . Obstructive sleep apnea 06/30/2010    is allergic to plavix [clopidogrel].  MEDICAL HISTORY: Past Medical History:  Diagnosis Date  . Arthritis   . Ascending aorta  dilatation (H[facility])   . BPH (benign prostatic hyperplasia)   . CAD (coronary artery disease)    a. MI 2004 with occ RCA.  . Cardiomyopathy    EF 40%  . Chronic systolic CHF (congestive heart failure) ([place])   . Colon polyps   . CVA (cerebral infarction)    Renal artery branch occlusion  . Diabetes mellitus, type 2 ([place])   . History of gout   . Hyperlipidemia   . Hypertension   . Idiopathic thrombocytopenia ([place])   . Morbid obesity ([place])   . Myocardial infarction ([place])   . OSA (obstructive sleep apnea)    07/25/10 AHI 14.9/hr  . Pneumonia   . PONV (postoperative nausea and vomiting)   . Primary localized osteoarthritis of left knee 06/07/2015  . Rhinosinusitis   . Shortness of breath dyspnea   . Stroke ([place])   . Vision decreased     SURGICAL HISTORY: Past Surgical History:  Procedure Laterality Date  . CARDIAC CATHETERIZATION  2005   no stents  . CYST EXCISION     chest and back  . HERNIA REPAIR     umbilical  . JOINT REPLACEMENT     Left 06/07/15  . KNEE CARTILAGE SURGERY     left  . MENISCUS REPAIR Left   . NASAL SINUS SURGERY    . SINUS IRRIGATION    . TOTAL KNEE ARTHROPLASTY Left 06/07/2015   Procedure: TOTAL KNEE ARTHROPLASTY;  Surgeon: [name], MD;  Location: [facility];  Service: Orthopedics;  Laterality: Left;  . UMBILICAL HERNIA REPAIR      SOCIAL HISTORY: Social History   Social History  . Marital  status: Married    Spouse name: N/A  . Number of children: N/A  . Years of education: N/A   Occupational History  . 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 Smoking status: Former Smoker    Packs/day: 3.00    Years: 20.00    Types: Cigars, Cigarettes  . Smokeless tobacco: Never Used     Comment: stopped smoking cigarettes in 1991, smoked cigars from 2000 to Jan 2018, 1 cigar per day  . Alcohol use Yes     Comment: 5 drinks a week  . Drug use: No  . Sexual activity: Not on file   Other Topics Concern  . Not on  file   Social History Narrative   ** Merged History Encounter **        FAMILY HISTORY: Family History  Problem Relation Age of Onset  . Hypertension Father   . Colon cancer Neg Hx   . Heart attack Neg Hx     Review of Systems  Constitutional: Negative.   HENT:  Negative.   Eyes: Negative.   Respiratory: Negative for cough, hemoptysis, shortness of breath and wheezing.        Dyspnea on exertion.  Cardiovascular: Negative.   Gastrointestinal: Negative.   Genitourinary: Negative.    Musculoskeletal: Negative for back pain and neck pain.       Right knee pain.  Skin: Negative.   Neurological: Negative.   Hematological: Negative.   Psychiatric/Behavioral: Negative.       PHYSICAL EXAMINATION  ECOG PERFORMANCE STATUS: 1 - Symptomatic but completely ambulatory  Vitals:   07/30/17 0928  BP: (!) 84/66  Pulse: 66  Resp: 20  Temp: 98.9 F (37.2 C)  SpO2: 96%    Physical Exam  Constitutional: He is oriented to person, place, and time and well-developed, well-nourished, and in no distress. No distress.  HENT:  Head: Normocephalic.  Mouth/Throat: Oropharynx is clear and moist. No oropharyngeal exudate.  Eyes: Conjunctivae are normal. Right eye exhibits no discharge. Left eye exhibits no discharge. No scleral icterus.  Neck: Normal range of motion. Neck supple.  Cardiovascular: Normal rate, regular rhythm, normal heart sounds and intact distal pulses.   Pulmonary/Chest: Effort normal and breath sounds normal. No respiratory distress. He has no wheezes. He has no rales.  Abdominal: Soft. Bowel sounds are normal. He exhibits no distension and no mass. There is no tenderness.  Musculoskeletal: Normal range of motion. He exhibits no edema.  Lymphadenopathy:    He has no cervical adenopathy.  Neurological: He is alert and oriented to person, place, and time. He exhibits normal muscle tone.  Skin: Skin is warm and dry. No rash noted. He is not diaphoretic. No erythema. No  pallor.  Psychiatric: Mood, memory, affect and judgment normal.    LABORATORY DATA:  CBC    Component Value Date/Time   WBC 5.3 07/30/2017 0903   WBC 6.2 07/26/2017 1322   RBC 4.77 07/30/2017 0903   RBC 4.78 07/26/2017 1322   HGB 15.5 07/30/2017 0903   HCT 45.7 07/30/2017 0903   PLT 87 (L) 07/30/2017 0903   MCV 95.8 07/30/2017 0903   MCH 32.5 07/30/2017 0903   MCH 32.2 07/26/2017 1322   MCHC 33.9 07/30/2017 0903   MCHC 33.9 07/26/2017 1322   RDW 13.3 07/30/2017 0903   LYMPHSABS 1.1 07/30/2017 0903   MONOABS 0.5 07/30/2017 0903   EOSABS 0.4 07/30/2017 0903   BASOSABS 0.0 07/30/2017 0903    CMP  Component Value Date/Time   NA 138 07/26/2017 1322   NA 141 05/17/2017 0836   NA 137 05/09/2015 1111   K 4.6 07/26/2017 1322   K 4.7 05/09/2015 1111   CL 104 07/26/2017 1322   CO2 26 07/26/2017 1322   CO2 22 05/09/2015 1111   GLUCOSE 160 (H) 07/26/2017 1322   GLUCOSE 180 (H) 05/09/2015 1111   BUN 14 07/26/2017 1322   BUN 16 05/17/2017 0836   BUN 15.2 05/09/2015 1111   CREATININE 0.86 07/26/2017 1322   CREATININE 0.8 05/09/2015 1111   CALCIUM 9.1 07/26/2017 1322   CALCIUM 8.7 05/09/2015 1111   PROT 6.1 (L) 05/09/2015 1111   ALBUMIN 3.7 05/09/2015 1111   AST 16 05/09/2015 1111   ALT 17 05/09/2015 1111   ALKPHOS 46 05/09/2015 1111   BILITOT 0.82 05/09/2015 1111   GFRNONAA >60 07/26/2017 1322   GFRAA >60 07/26/2017 1322   RADIOGRAPHIC STUDIES:  No results found.  ASSESSMENT and THERAPY PLAN:   Thrombocytopenia This is a very pleasant 66 year old white male with chronic idiopathic thrombocytopenic purpura for several years and has been asymptomatic from his thrombocytopenia. His platelet count today is 87,000.   Patient was seen with Dr. Julien Nordmann. We do think the patient will have any major issues from a hematology standpoint if he proceeds with the left knee surgery was his current platelets count 87,000 or any platelets count to over 70,000.  He we will  discontinue Plavix and aspirin before his surgery. He states that his last dose is scheduled for today.  The patient is concerned about his platelet count dropping after surgery. We have recommended to him that he have his platelets rechecked one week and then 2 weeks after surgery or if he has any increased bruising or bleeding.  The patient will also benefit from anticoagulation for 3-4 weeks after his knee surgery for DVT prophylaxis As long as his platelet count is above 50,000.  The patient will be seen back on as-needed basis. We will be happy to see him in the future if he has platelets count less than 30,000 or significant bleeding issues.   The patient was advised to call immediately if he has any concerning symptoms in the interval.   No orders of the defined types were placed in this encounter.   All questions were answered. The patient knows to call the clinic with any problems, questions or concerns. We can certainly see the patient much sooner if necessary. Gordon Bussing, NP 07/30/2017   ADDENDUM: Hematology/Oncology Attending: I had a face to face encounter with the patient today. I recommended his care plan. This is a very pleasant 66 years old white male with history of idiopathic thrombocytopenic purpura and has been observation with no active treatment. The patient has a scheduled for right knee surgery and he is here for hematologic clearance because of his low platelets count. Repeat CBC today showed platelets count of 87,000. I think the patient would be safe to proceed with his surgery as planned with this number. He would require the appropriate DVT prophylaxis after his surgery as recommended by orthopedic surgery unless his platelets count less than 50,000. The patient will need cardiac and pulmonary clearance from his primary care physician or other specialist as needed. We will continue to see him on as-needed basis. He was advised to call if he has any concerning  symptoms in the interval. Disclaimer: This note was dictated with voice recognition software. Similar sounding words can  inadvertently be transcribed and may be missed upon review. Eilleen Kempf, MD 07/30/17

## 2017-07-30 NOTE — Assessment & Plan Note (Signed)
This is a very pleasant 66 year old white male with chronic idiopathic thrombocytopenic purpura for several years and has been asymptomatic from his thrombocytopenia. His platelet count today is 87,000.   Patient was seen with Dr. Julien Nordmann. We do think the patient will have any major issues from a hematology standpoint if he proceeds with the left knee surgery was his current platelets count 87,000 or any platelets count to over 70,000.  He we will discontinue Plavix and aspirin before his surgery. He states that his last dose is scheduled for today.  The patient is concerned about his platelet count dropping after surgery. We have recommended to him that he have his platelets rechecked one week and then 2 weeks after surgery or if he has any increased bruising or bleeding.  The patient will also benefit from anticoagulation for 3-4 weeks after his knee surgery for DVT prophylaxis As long as his platelet count is above 50,000.  The patient will be seen back on as-needed basis. We will be happy to see him in the future if he has platelets count less than 30,000 or significant bleeding issues.   The patient was advised to call immediately if he has any concerning symptoms in the interval.

## 2017-07-30 NOTE — Telephone Encounter (Signed)
No 9/4 los. °

## 2017-07-31 ENCOUNTER — Telehealth: Payer: Self-pay | Admitting: Physician Assistant

## 2017-07-31 DIAGNOSIS — I255 Ischemic cardiomyopathy: Secondary | ICD-10-CM | POA: Insufficient documentation

## 2017-07-31 NOTE — Telephone Encounter (Signed)
Returned pts call and have advised him that he needed to keep his f/u appt due to maybe titrating medications for his ef%. Pt agreed and will be at the appt.

## 2017-07-31 NOTE — Telephone Encounter (Signed)
Yes, I would keep appointment to see if we are able to further titrate medications for his low EF. [name] PA-C

## 2017-07-31 NOTE — Telephone Encounter (Signed)
New message    Pt wants to know if he needs this appt ?  His bp is under control with the medication he was put on 120/81 as of yesterday.   Please call and confirm appt with him.

## 2017-07-31 NOTE — Telephone Encounter (Signed)
Dayna, Please see message below. He states his bp is under control and his appt was for med titration. Does he need to keep it?

## 2017-07-31 NOTE — Progress Notes (Signed)
Cardiology Office Note    Date:  08/01/2017   ID:  Gordon Madden, DOB March 17, 1951, MRN [id number]  PCP:  Gordon Pillar, MD  Cardiologist: Gordon Madden  Chief Complaint  Patient presents with  . Follow-up    History of Present Illness:  Gordon Madden is a 66 y.o. male  with history of CADStatus post MI in 2004 and found to have a totally occluded RCA at [facility] in [place]. Last stress Myoview 05/2014 low risk with large inferior lateral scar and minimal peri-infarct ischemia EF 35% with inferolateral akinesis/mild dyskinesis. Felt to be low risk. 2-D echo 05/2014 EF 40-40%. chronic systolic CHF with EF 93-23%, diabetes mellitus, CVA, gout, HTN, HLD, OSA, BPH, morbid obesity, chronic DOE, chronic thrombocytopenia, arthritis, mildly dilated aorta.  Patient side [facility] 07/10/17 for surgical clearance before undergoing right knee surgery next month. He was having worsening shortness of breath. 2-D echo shows LVEF 35-40% with global hypokinesis and grade 1 DD-worse than before. Nuclear stress test 07/18/17 LVEF 36% with fixed large severe basal inferior and basal to mid inferior lateral defect but no ischemia.[facility] reviewed and felt he could proceed with surgery and to avoid excessive IV fluids. Recent BNP was normal and weight has been about the same. Can consider initiation of spironolactone versus switching to Entresto.  Patient comes in today for follow-up. He has been watching his salt intake closely. He's lost a couple pounds. He still has some edema. He was given Lasix and potassium by [facility] and only took one dose because he took it in the evening and was up all night urinating. He has chronic dyspnea on exertion.     Past Medical History:  Diagnosis Date  . Arthritis   . Ascending aorta dilatation (HCC)   . BPH (benign prostatic hyperplasia)   . CAD (coronary artery disease)    a. MI 2004 with occ RCA.  . Cardiomyopathy    EF 40%  .  Chronic systolic CHF (congestive heart failure) ([place])   . Colon polyps   . CVA (cerebral infarction)    Renal artery branch occlusion  . Diabetes mellitus, type 2 ([place])   . History of gout   . Hyperlipidemia   . Hypertension   . Idiopathic thrombocytopenia ([place])   . Morbid obesity ([place])   . Myocardial infarction ([place])   . OSA (obstructive sleep apnea)    07/25/10 AHI 14.9/hr  . Pneumonia   . PONV (postoperative nausea and vomiting)   . Primary localized osteoarthritis of left knee 06/07/2015  . Rhinosinusitis   . Shortness of breath dyspnea   . Stroke ([place])   . Vision decreased     Past Surgical History:  Procedure Laterality Date  . CARDIAC CATHETERIZATION  2005   no stents  . CYST EXCISION     chest and back  . HERNIA REPAIR     umbilical  . JOINT REPLACEMENT     Left 06/07/15  . KNEE CARTILAGE SURGERY     left  . MENISCUS REPAIR Left   . NASAL SINUS SURGERY    . SINUS IRRIGATION    . TOTAL KNEE ARTHROPLASTY Left 06/07/2015   Procedure: TOTAL KNEE ARTHROPLASTY;  Surgeon: Gordon Bond, MD;  Location: [facility];  Service: Orthopedics;  Laterality: Left;  . UMBILICAL HERNIA REPAIR      Current Medications: Current Meds  Medication Sig  . aspirin EC 81 MG tablet Take 81 mg by mouth daily.  [name]  carvedilol (COREG) 6.25 MG tablet Take 1 tablet (6.25 mg total) by mouth 2 (two) times daily.  . clopidogrel (PLAVIX) 75 MG tablet Take 75 mg by mouth daily.  . Coenzyme Q10 (CO Q 10 PO) Take 1 tablet by mouth daily.  . fexofenadine (ALLEGRA) 180 MG tablet Take 180 mg by mouth daily.  . folic acid (FOLVITE) 270 MCG tablet Take 400 mcg by mouth daily.  [name] GLUCOSAMINE-CHONDROITIN PO Take 1 tablet by mouth 2 (two) times daily.  [name] lisinopril (PRINIVIL,ZESTRIL) 20 MG tablet Take 1 tablet (20 mg total) by mouth daily.  . metFORMIN (GLUCOPHAGE) 500 MG tablet Take 1,000 mg by mouth 2 (two) times daily with a meal.   . pravastatin (PRAVACHOL) 40 MG tablet Take 40 mg by mouth daily.  . vitamin  B-12 (CYANOCOBALAMIN) 1000 MCG tablet Take 1,000 mcg by mouth daily.  . [DISCONTINUED] lisinopril (PRINIVIL,ZESTRIL) 10 MG tablet Take 20 mg by mouth 2 (two) times daily.      Allergies:   Plavix [clopidogrel]   Social History   Social History  . Marital status: Married    Spouse name: N/A  . Number of children: N/A  . Years of education: N/A   Occupational History  . 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 Smoking status: Former Smoker    Packs/day: 3.00    Years: 20.00    Types: Cigars, Cigarettes  . Smokeless tobacco: Never Used     Comment: stopped smoking cigarettes in 1991, smoked cigars from 2000 to Jan 2018, 1 cigar per day  . Alcohol use Yes     Comment: 5 drinks a week  . Drug use: No  . Sexual activity: Not Asked   Other Topics Concern  . None   Social History Narrative   ** Merged History Encounter **         Family History:  The patient's family history includes Hypertension in his father.   ROS:   Please see the history of present illness.    Review of Systems  Constitution: Negative.  HENT: Negative.   Cardiovascular: Negative.   Respiratory: Negative.   Endocrine: Negative.   Hematologic/Lymphatic: Negative.   Musculoskeletal: Positive for joint pain and joint swelling.  Gastrointestinal: Negative.   Genitourinary: Positive for frequency.  Neurological: Negative.    All other systems reviewed and are negative.   PHYSICAL EXAM:   VS:  BP 138/84   Pulse 82   Ht 5' 11.5" (1.816 m)   Wt (!) 338 lb 1.9 oz (153.4 kg)   SpO2 97%   BMI 46.50 kg/m   Physical Exam  GEN: Well nourished, well developed, in no acute distress  Neck: no JVD, carotid bruits, or masses Cardiac:Distant heart sounds RRR; no murmurs, rubs, or gallops  Respiratory: Decreased breath sounds but clear to auscultation bilaterally, normal work of breathing GI: soft, nontender, nondistended, + BS Ext: +1 edema bilaterally without cyanosis,  clubbing. Good distal pulses bilaterally Neuro:  Alert and Oriented x 3 Psych: euthymic mood, full affect  Wt Readings from Last 3 Encounters:  08/01/17 (!) 338 lb 1.9 oz (153.4 kg)  07/30/17 (!) 344 lb 3.2 oz (156.1 kg)  07/26/17 (!) 336 lb 14.4 oz (152.8 kg)      Studies/Labs Reviewed:   EKG:  EKG is not ordered today.    Recent Labs: 05/17/2017: NT-Pro BNP 235 07/26/2017: BUN 14; Creatinine, Ser 0.86; Potassium 4.6; Sodium 138 07/30/2017: HGB 15.5; Platelets 87  Lipid Panel No results found for: CHOL, TRIG, HDL, CHOLHDL, VLDL, LDLCALC, LDLDIRECT  Additional studies/ records that were reviewed today include:  2-D echo 07/18/17  Study Conclusions   - Left ventricle: The cavity size was moderately dilated. Wall   thickness was normal. Systolic function was moderately reduced.   The estimated ejection fraction was in the range of 35% to 40%.   Global hypokinesis with regional variation inferiorly. Doppler   parameters are consistent with abnormal left ventricular   relaxation (grade 1 diastolic dysfunction). The E/e&' ratio is   between 8-15, suggesting indeterminate LV filling pressure.   Ejection fraction (MOD, 2-plane): 37%. - Aortic valve: Trileaflet; mildly calcified leaflets. There was no   stenosis. There was no regurgitation. - Mitral valve: Calcified annulus. Mildly thickened leaflets .   There was trivial regurgitation. - Left atrium: Moderately dilated. The atrium was mildly dilated. - Right atrium: Moderately dilated. - Inferior vena cava: The vessel was normal in size. The   respirophasic diameter changes were in the normal range (>= 50%),   consistent with normal central venous pressure.   Impressions:   - Technically difficult study. Definity contrast given. LVEF   35-40%, moderately dilated ventricle with normal wall thickness,   global hypokinesis with regional variation inferiorly, grade 1   DD, indeterminate LV filling prsesure, mild MR, moderate  biatrial   enlargement, normal IVC. Compared to a prior study in 2015, the   LVEF is reduced.    Nuclear stress test 8/23/18Nuclear stress EF: 36%.  There was no ST segment deviation noted during stress.   1. EF 36% with basal/mid inferolateral and basal inferior hypokinesis.  2. Fixed large, severe basal inferior and basal to mid inferolateral perfusion defect.  This suggests infarction without significant ischemia.    Intermediate risk study due to low EF.      ASSESSMENT:    1. Atherosclerosis of native coronary artery of native heart without angina pectoris   2. Ischemic cardiomyopathy   3. Essential hypertension      PLAN:  In order of problems listed above:  CAD without angina and recent Lexi scan showed no ischemia but was intermediate risk because of an EF of 36%. Patient was cleared for knee surgery next week by [facility]  Ischemic cardiomyopathy with worsening LV function EF 35-40% on 2-D echo. Patient has chronic dyspnea on exertion and edema. He has not been taking his Lasix her potassium. He does have some edema today. Recommend resuming Lasix and potassium to take morning. After his surgery we will stop his lisinopril for 36 hours and start Entresto 49/51 mg daily. Renal function was normal on 07/26/17. Will need follow-up bmet and appt with Gordon Madden for titration. Continue 2 g sodium diet.  Essential hypertension blood pressure is stable today.  Obesity hopefully after his knee replacement increase his exercise and weight loss program.    Medication Adjustments/Labs and Tests Ordered: Current medicines are reviewed at length with the patient today.  Concerns regarding medicines are outlined above.  Medication changes, Labs and Tests ordered today are listed in the Patient Instructions below. Patient Instructions  Medication Instructions:  Your physician has recommended you make the following change in your medication:  1. Restart lasix in the am ( 40 mg )  daily 2. Restart klor con in the am ( 20 meq) daily 3. Stop lisinopril (20 mg ) daily on September 14. 3. Start Entresto (49/51 mg ) twice daily, but first dose on the eve of  September 15.  Patients was given samples today to last till [facility] appointment.    Labwork: -None  Testing/Procedures: -None  Follow-Up: Your physician recommends that you keep your scheduled  follow-up appointment with Gordon Madden.    Any Other Special Instructions Will Be Listed Below (If Applicable).     If you need a refill on your cardiac medications before your next appointment, please call your pharmacy.      Sumner Boast, PA-C  08/01/2017 10:06 AM    [facility], [place], [place]  [address] Phone: [phone]; Fax: [phone]

## 2017-08-01 ENCOUNTER — Encounter: Payer: Self-pay | Admitting: Physician Assistant

## 2017-08-01 ENCOUNTER — Ambulatory Visit (INDEPENDENT_AMBULATORY_CARE_PROVIDER_SITE_OTHER): Payer: Medicare HMO | Admitting: Physician Assistant

## 2017-08-01 VITALS — BP 138/84 | HR 82 | Ht 71.5 in | Wt 338.1 lb

## 2017-08-01 DIAGNOSIS — I2510 Atherosclerotic heart disease of native coronary artery without angina pectoris: Secondary | ICD-10-CM | POA: Diagnosis not present

## 2017-08-01 DIAGNOSIS — I10 Essential (primary) hypertension: Secondary | ICD-10-CM

## 2017-08-01 DIAGNOSIS — I255 Ischemic cardiomyopathy: Secondary | ICD-10-CM | POA: Diagnosis not present

## 2017-08-01 MED ORDER — POTASSIUM CHLORIDE CRYS ER 20 MEQ PO TBCR: 20.0000 meq | EXTENDED_RELEASE_TABLET | Freq: Every day | ORAL | 3 refills | Status: DC

## 2017-08-01 MED ORDER — FUROSEMIDE 40 MG PO TABS: 40.0000 mg | ORAL_TABLET | Freq: Every day | ORAL | 3 refills | Status: DC

## 2017-08-01 MED ORDER — SACUBITRIL-VALSARTAN 49-51 MG PO TABS: 1.0000 | ORAL_TABLET | Freq: Two times a day (BID) | ORAL | 9 refills | Status: DC

## 2017-08-01 NOTE — Patient Instructions (Signed)
Medication Instructions:  Your physician has recommended you make the following change in your medication:  1. Restart lasix in the am ( 40 mg ) daily 2. Restart klor con in the am ( 20 meq) daily 3. Stop lisinopril (20 mg ) daily on September 14. 3. Start Entresto (49/51 mg ) twice daily, but first dose on the eve of September 15.  Patients was given samples today to last till Dr. Irish Lack appointment.    Labwork: -None  Testing/Procedures: -None  Follow-Up: Your physician recommends that you keep your scheduled  follow-up appointment with Dr. Irish Lack.    Any Other Special Instructions Will Be Listed Below (If Applicable).     If you need a refill on your cardiac medications before your next appointment, please call your pharmacy.

## 2017-08-05 MED ORDER — DEXTROSE 5 % IV SOLN
3.0000 g | INTRAVENOUS | Status: AC
  Administered 2017-08-06: 3 g via INTRAVENOUS
  Filled 2017-08-05: qty 3

## 2017-08-06 ENCOUNTER — Ambulatory Visit (HOSPITAL_COMMUNITY): Payer: Medicare HMO | Admitting: Anesthesiology

## 2017-08-06 ENCOUNTER — Observation Stay (HOSPITAL_COMMUNITY): Payer: Medicare HMO

## 2017-08-06 ENCOUNTER — Encounter (HOSPITAL_COMMUNITY): Payer: Self-pay | Admitting: *Deleted

## 2017-08-06 ENCOUNTER — Encounter (HOSPITAL_COMMUNITY)
Admission: RE | Disposition: A | Discharge status: Home / Self Care | Payer: Self-pay | Source: Ambulatory Visit | Attending: Orthopedic Surgery

## 2017-08-06 ENCOUNTER — Ambulatory Visit (HOSPITAL_COMMUNITY): Payer: Medicare HMO | Admitting: Vascular Surgery

## 2017-08-06 ENCOUNTER — Observation Stay (HOSPITAL_COMMUNITY)
Admission: RE | Admit: 2017-08-06 | Discharge: 2017-08-07 | Disposition: A | Discharge status: Home / Self Care | Payer: Medicare HMO | Source: Ambulatory Visit | Attending: Orthopedic Surgery | Admitting: Orthopedic Surgery

## 2017-08-06 DIAGNOSIS — M109 Gout, unspecified: Secondary | ICD-10-CM | POA: Insufficient documentation

## 2017-08-06 DIAGNOSIS — N400 Enlarged prostate without lower urinary tract symptoms: Secondary | ICD-10-CM | POA: Insufficient documentation

## 2017-08-06 DIAGNOSIS — Z96651 Presence of right artificial knee joint: Secondary | ICD-10-CM

## 2017-08-06 DIAGNOSIS — I110 Hypertensive heart disease with heart failure: Secondary | ICD-10-CM | POA: Insufficient documentation

## 2017-08-06 DIAGNOSIS — Z79899 Other long term (current) drug therapy: Secondary | ICD-10-CM | POA: Diagnosis not present

## 2017-08-06 DIAGNOSIS — G8918 Other acute postprocedural pain: Secondary | ICD-10-CM | POA: Diagnosis not present

## 2017-08-06 DIAGNOSIS — M1990 Unspecified osteoarthritis, unspecified site: Secondary | ICD-10-CM | POA: Diagnosis not present

## 2017-08-06 DIAGNOSIS — M1711 Unilateral primary osteoarthritis, right knee: Secondary | ICD-10-CM | POA: Diagnosis not present

## 2017-08-06 DIAGNOSIS — Z7984 Long term (current) use of oral hypoglycemic drugs: Secondary | ICD-10-CM | POA: Insufficient documentation

## 2017-08-06 DIAGNOSIS — Z6841 Body Mass Index (BMI) 40.0 and over, adult: Secondary | ICD-10-CM | POA: Diagnosis not present

## 2017-08-06 DIAGNOSIS — D693 Immune thrombocytopenic purpura: Secondary | ICD-10-CM | POA: Insufficient documentation

## 2017-08-06 DIAGNOSIS — Z96659 Presence of unspecified artificial knee joint: Secondary | ICD-10-CM

## 2017-08-06 DIAGNOSIS — Z8673 Personal history of transient ischemic attack (TIA), and cerebral infarction without residual deficits: Secondary | ICD-10-CM | POA: Diagnosis not present

## 2017-08-06 DIAGNOSIS — G4733 Obstructive sleep apnea (adult) (pediatric): Secondary | ICD-10-CM | POA: Insufficient documentation

## 2017-08-06 DIAGNOSIS — E785 Hyperlipidemia, unspecified: Secondary | ICD-10-CM | POA: Insufficient documentation

## 2017-08-06 DIAGNOSIS — I429 Cardiomyopathy, unspecified: Secondary | ICD-10-CM | POA: Insufficient documentation

## 2017-08-06 DIAGNOSIS — Z87891 Personal history of nicotine dependence: Secondary | ICD-10-CM | POA: Insufficient documentation

## 2017-08-06 DIAGNOSIS — Z7901 Long term (current) use of anticoagulants: Secondary | ICD-10-CM | POA: Diagnosis not present

## 2017-08-06 DIAGNOSIS — I2510 Atherosclerotic heart disease of native coronary artery without angina pectoris: Secondary | ICD-10-CM | POA: Diagnosis not present

## 2017-08-06 DIAGNOSIS — I252 Old myocardial infarction: Secondary | ICD-10-CM | POA: Diagnosis not present

## 2017-08-06 DIAGNOSIS — E119 Type 2 diabetes mellitus without complications: Secondary | ICD-10-CM | POA: Insufficient documentation

## 2017-08-06 DIAGNOSIS — I5022 Chronic systolic (congestive) heart failure: Secondary | ICD-10-CM | POA: Diagnosis not present

## 2017-08-06 DIAGNOSIS — I509 Heart failure, unspecified: Secondary | ICD-10-CM | POA: Diagnosis not present

## 2017-08-06 DIAGNOSIS — Z471 Aftercare following joint replacement surgery: Secondary | ICD-10-CM | POA: Diagnosis not present

## 2017-08-06 DIAGNOSIS — Z96652 Presence of left artificial knee joint: Secondary | ICD-10-CM | POA: Diagnosis not present

## 2017-08-06 HISTORY — DX: Unilateral primary osteoarthritis, right knee: M17.11

## 2017-08-06 HISTORY — PX: KNEE SURGERY: SHX244

## 2017-08-06 HISTORY — PX: PARTIAL KNEE ARTHROPLASTY: SHX2174

## 2017-08-06 LAB — GLUCOSE, CAPILLARY
GLUCOSE-CAPILLARY: 162 mg/dL — AB (ref 65–99)
GLUCOSE-CAPILLARY: 211 mg/dL — AB (ref 65–99)

## 2017-08-06 SURGERY — ARTHROPLASTY, KNEE, UNICOMPARTMENTAL
Anesthesia: Spinal | Site: Knee | Laterality: Right

## 2017-08-06 MED ORDER — POTASSIUM CHLORIDE CRYS ER 20 MEQ PO TBCR
20.0000 meq | EXTENDED_RELEASE_TABLET | Freq: Every day | ORAL | Status: DC
  Administered 2017-08-06 – 2017-08-07 (×2): 20 meq via ORAL
  Filled 2017-08-06 (×2): qty 1

## 2017-08-06 MED ORDER — OXYCODONE HCL 5 MG PO TABS
ORAL_TABLET | ORAL | Status: AC
Start: 2017-08-06 — End: 2017-08-07
  Filled 2017-08-06: qty 2

## 2017-08-06 MED ORDER — BISACODYL 10 MG RE SUPP: 10.0000 mg | Freq: Every day | RECTAL | Status: DC | PRN

## 2017-08-06 MED ORDER — PROPOFOL 10 MG/ML IV BOLUS
INTRAVENOUS | Status: DC | PRN
  Administered 2017-08-06: 150 mg via INTRAVENOUS

## 2017-08-06 MED ORDER — MENTHOL 3 MG MT LOZG: 1.0000 | LOZENGE | OROMUCOSAL | Status: DC | PRN

## 2017-08-06 MED ORDER — 0.9 % SODIUM CHLORIDE (POUR BTL) OPTIME
TOPICAL | Status: DC | PRN
  Administered 2017-08-06: 1000 mL

## 2017-08-06 MED ORDER — ALUM & MAG HYDROXIDE-SIMETH 200-200-20 MG/5ML PO SUSP: 30.0000 mL | ORAL | Status: DC | PRN

## 2017-08-06 MED ORDER — ROPIVACAINE HCL 7.5 MG/ML IJ SOLN
INTRAMUSCULAR | Status: DC | PRN
  Administered 2017-08-06: 20 mL via PERINEURAL

## 2017-08-06 MED ORDER — METHOCARBAMOL 1000 MG/10ML IJ SOLN
500.0000 mg | Freq: Four times a day (QID) | INTRAVENOUS | Status: DC | PRN
  Filled 2017-08-06: qty 5

## 2017-08-06 MED ORDER — MEPERIDINE HCL 25 MG/ML IJ SOLN: 6.2500 mg | INTRAMUSCULAR | Status: DC | PRN

## 2017-08-06 MED ORDER — CEFAZOLIN SODIUM-DEXTROSE 2-4 GM/100ML-% IV SOLN
2.0000 g | Freq: Four times a day (QID) | INTRAVENOUS | Status: AC
  Administered 2017-08-06 – 2017-08-07 (×2): 2 g via INTRAVENOUS
  Filled 2017-08-06 (×3): qty 100

## 2017-08-06 MED ORDER — HYDROMORPHONE HCL 1 MG/ML IJ SOLN
0.5000 mg | INTRAMUSCULAR | Status: DC | PRN
  Administered 2017-08-06 (×2): 0.5 mg via INTRAVENOUS
  Filled 2017-08-06 (×2): qty 1

## 2017-08-06 MED ORDER — PROMETHAZINE HCL 25 MG/ML IJ SOLN: 6.2500 mg | INTRAMUSCULAR | Status: DC | PRN

## 2017-08-06 MED ORDER — HYDROMORPHONE HCL 1 MG/ML IJ SOLN
INTRAMUSCULAR | Status: AC
  Filled 2017-08-06: qty 1

## 2017-08-06 MED ORDER — FENTANYL CITRATE (PF) 250 MCG/5ML IJ SOLN
INTRAMUSCULAR | Status: AC
  Filled 2017-08-06: qty 5

## 2017-08-06 MED ORDER — MIDAZOLAM HCL 5 MG/5ML IJ SOLN
INTRAMUSCULAR | Status: DC | PRN
  Administered 2017-08-06: 2 mg via INTRAVENOUS

## 2017-08-06 MED ORDER — MIDAZOLAM HCL 2 MG/2ML IJ SOLN
INTRAMUSCULAR | Status: AC
  Filled 2017-08-06: qty 2

## 2017-08-06 MED ORDER — PRAVASTATIN SODIUM 40 MG PO TABS
40.0000 mg | ORAL_TABLET | Freq: Every day | ORAL | Status: DC
  Administered 2017-08-06 – 2017-08-07 (×2): 40 mg via ORAL
  Filled 2017-08-06 (×2): qty 1

## 2017-08-06 MED ORDER — METOCLOPRAMIDE HCL 5 MG/ML IJ SOLN: 5.0000 mg | Freq: Three times a day (TID) | INTRAMUSCULAR | Status: DC | PRN

## 2017-08-06 MED ORDER — ONDANSETRON HCL 4 MG/2ML IJ SOLN
INTRAMUSCULAR | Status: AC
  Filled 2017-08-06: qty 4

## 2017-08-06 MED ORDER — METHOCARBAMOL 500 MG PO TABS
ORAL_TABLET | ORAL | Status: AC
  Filled 2017-08-06: qty 1

## 2017-08-06 MED ORDER — METHOCARBAMOL 500 MG PO TABS
500.0000 mg | ORAL_TABLET | Freq: Four times a day (QID) | ORAL | Status: DC | PRN
  Administered 2017-08-06 (×2): 500 mg via ORAL
  Filled 2017-08-06 (×3): qty 1

## 2017-08-06 MED ORDER — PHENOL 1.4 % MT LIQD: 1.0000 | OROMUCOSAL | Status: DC | PRN

## 2017-08-06 MED ORDER — ONDANSETRON HCL 4 MG/2ML IJ SOLN: 4.0000 mg | Freq: Four times a day (QID) | INTRAMUSCULAR | Status: DC | PRN

## 2017-08-06 MED ORDER — PROPOFOL 10 MG/ML IV BOLUS
INTRAVENOUS | Status: AC
  Filled 2017-08-06: qty 20

## 2017-08-06 MED ORDER — FOLIC ACID 0.5 MG HALF TAB
500.0000 ug | ORAL_TABLET | Freq: Every day | ORAL | Status: DC
  Administered 2017-08-06 – 2017-08-07 (×2): 0.5 mg via ORAL
  Filled 2017-08-06 (×2): qty 1

## 2017-08-06 MED ORDER — LIDOCAINE HCL (CARDIAC) 20 MG/ML IV SOLN
INTRAVENOUS | Status: DC | PRN
  Administered 2017-08-06: 50 mg via INTRAVENOUS

## 2017-08-06 MED ORDER — SENNA-DOCUSATE SODIUM 8.6-50 MG PO TABS: 2.0000 | ORAL_TABLET | Freq: Every day | ORAL | 1 refills | Status: DC

## 2017-08-06 MED ORDER — LIDOCAINE 2% (20 MG/ML) 5 ML SYRINGE
INTRAMUSCULAR | Status: AC
  Filled 2017-08-06: qty 10

## 2017-08-06 MED ORDER — LISINOPRIL 20 MG PO TABS: 20.0000 mg | ORAL_TABLET | Freq: Every day | ORAL | Status: DC

## 2017-08-06 MED ORDER — DEXAMETHASONE SODIUM PHOSPHATE 10 MG/ML IJ SOLN
INTRAMUSCULAR | Status: AC
  Filled 2017-08-06: qty 1

## 2017-08-06 MED ORDER — SUGAMMADEX SODIUM 200 MG/2ML IV SOLN
INTRAVENOUS | Status: AC
  Filled 2017-08-06: qty 4

## 2017-08-06 MED ORDER — MAGNESIUM CITRATE PO SOLN: 1.0000 | Freq: Once | ORAL | Status: DC | PRN

## 2017-08-06 MED ORDER — CARVEDILOL 6.25 MG PO TABS
6.2500 mg | ORAL_TABLET | Freq: Once | ORAL | Status: AC
  Administered 2017-08-06: 6.25 mg via ORAL
  Filled 2017-08-06: qty 1

## 2017-08-06 MED ORDER — CARVEDILOL 6.25 MG PO TABS
6.2500 mg | ORAL_TABLET | Freq: Two times a day (BID) | ORAL | Status: DC
  Administered 2017-08-07: 6.25 mg via ORAL
  Filled 2017-08-06: qty 1

## 2017-08-06 MED ORDER — METOCLOPRAMIDE HCL 5 MG PO TABS
5.0000 mg | ORAL_TABLET | Freq: Three times a day (TID) | ORAL | Status: DC | PRN
Start: 2017-08-06 — End: 2017-08-07

## 2017-08-06 MED ORDER — RIVAROXABAN 10 MG PO TABS: 10.0000 mg | ORAL_TABLET | Freq: Every day | ORAL | 0 refills | Status: DC

## 2017-08-06 MED ORDER — SUGAMMADEX SODIUM 500 MG/5ML IV SOLN
INTRAVENOUS | Status: DC | PRN
  Administered 2017-08-06: 306.6 mg via INTRAVENOUS

## 2017-08-06 MED ORDER — PROPOFOL 500 MG/50ML IV EMUL
INTRAVENOUS | Status: DC | PRN
  Administered 2017-08-06: 10 ug/kg/min via INTRAVENOUS

## 2017-08-06 MED ORDER — ROCURONIUM BROMIDE 100 MG/10ML IV SOLN
INTRAVENOUS | Status: DC | PRN
  Administered 2017-08-06: 50 mg via INTRAVENOUS

## 2017-08-06 MED ORDER — ACETAMINOPHEN 650 MG RE SUPP: 650.0000 mg | Freq: Four times a day (QID) | RECTAL | Status: DC | PRN

## 2017-08-06 MED ORDER — ONDANSETRON HCL 4 MG PO TABS: 4.0000 mg | ORAL_TABLET | Freq: Four times a day (QID) | ORAL | Status: DC | PRN

## 2017-08-06 MED ORDER — ONDANSETRON HCL 4 MG PO TABS: 4.0000 mg | ORAL_TABLET | Freq: Three times a day (TID) | ORAL | 0 refills | Status: DC | PRN

## 2017-08-06 MED ORDER — POLYETHYLENE GLYCOL 3350 17 G PO PACK: 17.0000 g | PACK | Freq: Every day | ORAL | Status: DC | PRN

## 2017-08-06 MED ORDER — ACETAMINOPHEN 325 MG PO TABS: 650.0000 mg | ORAL_TABLET | Freq: Four times a day (QID) | ORAL | Status: DC | PRN

## 2017-08-06 MED ORDER — HYDROMORPHONE HCL 1 MG/ML IJ SOLN
1.0000 mg | INTRAMUSCULAR | Status: DC | PRN
  Administered 2017-08-06 – 2017-08-07 (×3): 1 mg via INTRAVENOUS
  Filled 2017-08-06 (×3): qty 1

## 2017-08-06 MED ORDER — SENNA 8.6 MG PO TABS
1.0000 | ORAL_TABLET | Freq: Two times a day (BID) | ORAL | Status: DC
  Administered 2017-08-06 – 2017-08-07 (×2): 8.6 mg via ORAL
  Filled 2017-08-06 (×2): qty 1

## 2017-08-06 MED ORDER — HYDROMORPHONE HCL 1 MG/ML IJ SOLN
0.2500 mg | INTRAMUSCULAR | Status: DC | PRN
  Administered 2017-08-06: 0.5 mg via INTRAVENOUS
  Administered 2017-08-06 (×4): 0.25 mg via INTRAVENOUS
  Administered 2017-08-06: 0.5 mg via INTRAVENOUS

## 2017-08-06 MED ORDER — BACLOFEN 10 MG PO TABS: 10.0000 mg | ORAL_TABLET | Freq: Three times a day (TID) | ORAL | 0 refills | Status: DC

## 2017-08-06 MED ORDER — ROCURONIUM BROMIDE 10 MG/ML (PF) SYRINGE
PREFILLED_SYRINGE | INTRAVENOUS | Status: AC
  Filled 2017-08-06: qty 5

## 2017-08-06 MED ORDER — POTASSIUM CHLORIDE IN NACL 20-0.45 MEQ/L-% IV SOLN
INTRAVENOUS | Status: DC
  Administered 2017-08-06 – 2017-08-07 (×2): via INTRAVENOUS
  Filled 2017-08-06 (×2): qty 1000

## 2017-08-06 MED ORDER — FENTANYL CITRATE (PF) 100 MCG/2ML IJ SOLN
INTRAMUSCULAR | Status: DC | PRN
  Administered 2017-08-06: 100 ug via INTRAVENOUS
  Administered 2017-08-06 (×3): 50 ug via INTRAVENOUS

## 2017-08-06 MED ORDER — METFORMIN HCL 500 MG PO TABS
1000.0000 mg | ORAL_TABLET | Freq: Two times a day (BID) | ORAL | Status: DC
  Administered 2017-08-06 – 2017-08-07 (×2): 1000 mg via ORAL
  Filled 2017-08-06 (×3): qty 2

## 2017-08-06 MED ORDER — DEXAMETHASONE SODIUM PHOSPHATE 10 MG/ML IJ SOLN
10.0000 mg | Freq: Once | INTRAMUSCULAR | Status: AC
  Administered 2017-08-07: 10 mg via INTRAVENOUS
  Filled 2017-08-06: qty 1

## 2017-08-06 MED ORDER — OXYCODONE HCL 5 MG PO TABS: 5.0000 mg | ORAL_TABLET | ORAL | 0 refills | Status: DC | PRN

## 2017-08-06 MED ORDER — RIVAROXABAN 10 MG PO TABS
10.0000 mg | ORAL_TABLET | Freq: Every day | ORAL | Status: DC
  Administered 2017-08-07: 10 mg via ORAL
  Filled 2017-08-06: qty 1

## 2017-08-06 MED ORDER — DOCUSATE SODIUM 100 MG PO CAPS
100.0000 mg | ORAL_CAPSULE | Freq: Two times a day (BID) | ORAL | Status: DC
  Administered 2017-08-06 – 2017-08-07 (×2): 100 mg via ORAL
  Filled 2017-08-06 (×3): qty 1

## 2017-08-06 MED ORDER — PHENYLEPHRINE HCL 10 MG/ML IJ SOLN
INTRAVENOUS | Status: DC | PRN
  Administered 2017-08-06: 25 ug/min via INTRAVENOUS

## 2017-08-06 MED ORDER — LORATADINE 10 MG PO TABS
10.0000 mg | ORAL_TABLET | Freq: Every day | ORAL | Status: DC
  Administered 2017-08-06 – 2017-08-07 (×2): 10 mg via ORAL
  Filled 2017-08-06 (×2): qty 1

## 2017-08-06 MED ORDER — OXYCODONE HCL 5 MG PO TABS
5.0000 mg | ORAL_TABLET | ORAL | Status: DC | PRN
  Administered 2017-08-06 – 2017-08-07 (×5): 10 mg via ORAL
  Filled 2017-08-06 (×3): qty 2
  Filled 2017-08-06: qty 1
  Filled 2017-08-06: qty 2

## 2017-08-06 MED ORDER — KETOROLAC TROMETHAMINE 15 MG/ML IJ SOLN
7.5000 mg | Freq: Four times a day (QID) | INTRAMUSCULAR | Status: DC
  Administered 2017-08-06 – 2017-08-07 (×3): 7.5 mg via INTRAVENOUS
  Filled 2017-08-06 (×3): qty 1

## 2017-08-06 MED ORDER — BUPIVACAINE HCL (PF) 0.5 % IJ SOLN
INTRAMUSCULAR | Status: AC
  Filled 2017-08-06: qty 30

## 2017-08-06 MED ORDER — SODIUM CHLORIDE 0.9 % IR SOLN
Status: DC | PRN
  Administered 2017-08-06: 3000 mL

## 2017-08-06 MED ORDER — DIPHENHYDRAMINE HCL 12.5 MG/5ML PO ELIX: 12.5000 mg | ORAL_SOLUTION | ORAL | Status: DC | PRN

## 2017-08-06 MED ORDER — SACUBITRIL-VALSARTAN 49-51 MG PO TABS
1.0000 | ORAL_TABLET | Freq: Two times a day (BID) | ORAL | Status: DC
  Administered 2017-08-06 – 2017-08-07 (×2): 1 via ORAL
  Filled 2017-08-06 (×2): qty 1

## 2017-08-06 MED ORDER — ONDANSETRON HCL 4 MG/2ML IJ SOLN
INTRAMUSCULAR | Status: DC | PRN
  Administered 2017-08-06: 4 mg via INTRAVENOUS

## 2017-08-06 MED ORDER — LACTATED RINGERS IV SOLN
INTRAVENOUS | Status: DC | PRN
  Administered 2017-08-06 (×2): via INTRAVENOUS

## 2017-08-06 MED ORDER — VITAMIN B-12 1000 MCG PO TABS
1000.0000 ug | ORAL_TABLET | Freq: Every day | ORAL | Status: DC
  Administered 2017-08-07: 1000 ug via ORAL
  Filled 2017-08-06 (×2): qty 1

## 2017-08-06 MED ORDER — KETOROLAC TROMETHAMINE 30 MG/ML IJ SOLN
INTRAMUSCULAR | Status: AC
  Filled 2017-08-06: qty 1

## 2017-08-06 MED ORDER — FUROSEMIDE 40 MG PO TABS
40.0000 mg | ORAL_TABLET | Freq: Every day | ORAL | Status: DC
  Administered 2017-08-06 – 2017-08-07 (×2): 40 mg via ORAL
  Filled 2017-08-06 (×2): qty 1

## 2017-08-06 MED ORDER — HYDROMORPHONE HCL 1 MG/ML IJ SOLN
1.0000 mg | Freq: Once | INTRAMUSCULAR | Status: AC
  Administered 2017-08-06: 1 mg via INTRAVENOUS
  Filled 2017-08-06: qty 1

## 2017-08-06 MED ORDER — CARVEDILOL 3.125 MG PO TABS
ORAL_TABLET | ORAL | Status: AC
  Filled 2017-08-06: qty 2

## 2017-08-06 MED ORDER — DIAZEPAM 5 MG PO TABS
5.0000 mg | ORAL_TABLET | Freq: Four times a day (QID) | ORAL | Status: DC | PRN
  Administered 2017-08-07: 5 mg via ORAL
  Filled 2017-08-06: qty 1

## 2017-08-06 MED ORDER — EPHEDRINE SULFATE 50 MG/ML IJ SOLN
INTRAMUSCULAR | Status: DC | PRN
  Administered 2017-08-06 (×2): 10 mg via INTRAVENOUS

## 2017-08-06 SURGICAL SUPPLY — 54 items
BANDAGE ESMARK 6X9 LF (GAUZE/BANDAGES/DRESSINGS) ×1 IMPLANT
BNDG ELASTIC 6X15 VLCR STRL LF (GAUZE/BANDAGES/DRESSINGS) ×3 IMPLANT
BNDG ESMARK 6X9 LF (GAUZE/BANDAGES/DRESSINGS) ×3
BOWL SMART MIX CTS (DISPOSABLE) ×3 IMPLANT
CEMENT HV SMART SET (Cement) ×6 IMPLANT
CLOSURE STERI-STRIP 1/2X4 (GAUZE/BANDAGES/DRESSINGS) ×1
CLOSURE WOUND 1/2 X4 (GAUZE/BANDAGES/DRESSINGS) ×1
CLSR STERI-STRIP ANTIMIC 1/2X4 (GAUZE/BANDAGES/DRESSINGS) ×2 IMPLANT
COVER SURGICAL LIGHT HANDLE (MISCELLANEOUS) ×3 IMPLANT
CUFF TOURNIQUET SINGLE 34IN LL (TOURNIQUET CUFF) ×3 IMPLANT
DRAPE EXTREMITY T 121X128X90 (DRAPE) IMPLANT
DRAPE HALF SHEET 40X57 (DRAPES) ×3 IMPLANT
DRAPE U-SHAPE 47X51 STRL (DRAPES) ×3 IMPLANT
DRSG MEPILEX BORDER 4X8 (GAUZE/BANDAGES/DRESSINGS) ×3 IMPLANT
DURAPREP 26ML APPLICATOR (WOUND CARE) ×3 IMPLANT
ELECT CAUTERY BLADE 6.4 (BLADE) ×3 IMPLANT
ELECT REM PT RETURN 9FT ADLT (ELECTROSURGICAL) ×3
ELECTRODE REM PT RTRN 9FT ADLT (ELECTROSURGICAL) ×1 IMPLANT
GAUZE SPONGE 4X4 12PLY STRL (GAUZE/BANDAGES/DRESSINGS) ×3 IMPLANT
GLOVE BIOGEL PI ORTHO PRO SZ8 (GLOVE) ×4
GLOVE ORTHO TXT STRL SZ7.5 (GLOVE) ×3 IMPLANT
GLOVE PI ORTHO PRO STRL SZ8 (GLOVE) ×2 IMPLANT
GLOVE SURG ORTHO 8.0 STRL STRW (GLOVE) ×3 IMPLANT
GOWN STRL REUS W/ TWL XL LVL3 (GOWN DISPOSABLE) ×1 IMPLANT
GOWN STRL REUS W/TWL 2XL LVL3 (GOWN DISPOSABLE) ×3 IMPLANT
GOWN STRL REUS W/TWL XL LVL3 (GOWN DISPOSABLE) ×2
HANDPIECE INTERPULSE COAX TIP (DISPOSABLE) ×2
HOOD PEEL AWAY FACE SHEILD DIS (HOOD) ×12 IMPLANT
IMMOBILIZER KNEE 22 UNIV (SOFTGOODS) ×3 IMPLANT
KIT BASIN OR (CUSTOM PROCEDURE TRAY) ×3 IMPLANT
KIT ROOM TURNOVER OR (KITS) ×3 IMPLANT
MANIFOLD NEPTUNE II (INSTRUMENTS) ×3 IMPLANT
NEEDLE HYPO 21X1.5 SAFETY (NEEDLE) ×3 IMPLANT
NS IRRIG 1000ML POUR BTL (IV SOLUTION) ×3 IMPLANT
PACK BLADE SAW RECIP 70 3 PT (BLADE) ×3 IMPLANT
PACK TOTAL JOINT (CUSTOM PROCEDURE TRAY) ×3 IMPLANT
PAD ABD 8X10 STRL (GAUZE/BANDAGES/DRESSINGS) IMPLANT
PAD ARMBOARD 7.5X6 YLW CONV (MISCELLANEOUS) ×6 IMPLANT
PAD CAST 4YDX4 CTTN HI CHSV (CAST SUPPLIES) IMPLANT
PADDING CAST COTTON 4X4 STRL (CAST SUPPLIES)
PADDING CAST COTTON 6X4 STRL (CAST SUPPLIES) IMPLANT
SET HNDPC FAN SPRY TIP SCT (DISPOSABLE) ×1 IMPLANT
STRIP CLOSURE SKIN 1/2X4 (GAUZE/BANDAGES/DRESSINGS) ×2 IMPLANT
SUCTION FRAZIER HANDLE 10FR (MISCELLANEOUS) ×2
SUCTION FRAZIER TIP 10 FR DISP (SUCTIONS) ×3 IMPLANT
SUCTION TUBE FRAZIER 10FR DISP (MISCELLANEOUS) ×1 IMPLANT
SUT VIC AB 0 CT1 27 (SUTURE) ×2
SUT VIC AB 0 CT1 27XBRD ANBCTR (SUTURE) ×1 IMPLANT
SUT VIC AB 1 CT1 27 (SUTURE) ×2
SUT VIC AB 1 CT1 27XBRD ANBCTR (SUTURE) ×1 IMPLANT
SUT VIC AB 3-0 SH 8-18 (SUTURE) ×3 IMPLANT
SYR CONTROL 10ML LL (SYRINGE) IMPLANT
TOWEL OR 17X24 6PK STRL BLUE (TOWEL DISPOSABLE) ×3 IMPLANT
TOWEL OR 17X26 10 PK STRL BLUE (TOWEL DISPOSABLE) ×3 IMPLANT

## 2017-08-06 NOTE — Transfer of Care (Signed)
Immediate Anesthesia Transfer of Care Note  Patient: Gordon Madden  Procedure(s) Performed: Procedure(s): UNICOMPARTMENTAL KNEE (Right)  Patient Location: PACU  Anesthesia Type:General  Level of Consciousness: awake, alert  and oriented  Airway & Oxygen Therapy: Patient Spontanous Breathing and Patient connected to nasal cannula oxygen  Post-op Assessment: Report given to RN, Post -op Vital signs reviewed and stable and Patient moving all extremities X 4  Post vital signs: Reviewed and stable  Last Vitals:  Vitals:   08/06/17 0624 08/06/17 1020  BP: 118/78 138/83  Pulse:  74  Resp:  20  Temp:  (!) 36.3 C  SpO2:  95%    Last Pain:  Vitals:   08/06/17 0601  TempSrc: Oral         Complications: No apparent anesthesia complications

## 2017-08-06 NOTE — H&P (Signed)
PREOPERATIVE H&P  Chief Complaint: DJD RIGHT KNEE  HPI: Gordon Madden is a 66 y.o. male who presents for preoperative history and physical with a diagnosis of DJD RIGHT KNEE. Symptoms are rated as moderate to severe, and have been worsening.  This is significantly impairing activities of daily living.  He has elected for surgical management.   He has failed injections, activity modification, anti-inflammatories, and assistive devices.  Preoperative X-rays demonstrate end stage degenerative changes with osteophyte formation, loss of joint space, subchondral sclerosis.   Past Medical History:  Diagnosis Date  . Arthritis   . Ascending aorta dilatation (HCC)   . BPH (benign prostatic hyperplasia)   . CAD (coronary artery disease)    a. MI 2004 with occ RCA.  . Cardiomyopathy    EF 40%  . Chronic systolic CHF (congestive heart failure) ([place])   . Colon polyps   . CVA (cerebral infarction)    Renal artery branch occlusion  . Diabetes mellitus, type 2 ([place])   . History of gout   . Hyperlipidemia   . Hypertension   . Idiopathic thrombocytopenia ([place])   . Morbid obesity ([place])   . Myocardial infarction ([place])   . OSA (obstructive sleep apnea)    07/25/10 AHI 14.9/hr  . Pneumonia   . PONV (postoperative nausea and vomiting)   . Primary localized osteoarthritis of left knee 06/07/2015  . Rhinosinusitis   . Shortness of breath dyspnea   . Stroke ([place])   . Vision decreased    Past Surgical History:  Procedure Laterality Date  . CARDIAC CATHETERIZATION  2005   no stents  . CYST EXCISION     chest and back  . HERNIA REPAIR     umbilical  . JOINT REPLACEMENT     Left 06/07/15  . KNEE CARTILAGE SURGERY     left  . MENISCUS REPAIR Left   . NASAL SINUS SURGERY    . SINUS IRRIGATION    . TOTAL KNEE ARTHROPLASTY Left 06/07/2015   Procedure: TOTAL KNEE ARTHROPLASTY;  Surgeon: [name], MD;  Location: [facility];  Service: Orthopedics;  Laterality: Left;  . UMBILICAL HERNIA REPAIR      Social History   Social History  . Marital status: Married    Spouse name: N/A  . Number of children: N/A  . Years of education: N/A   Occupational History  . 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 Smoking status: Former Smoker    Packs/day: 3.00    Years: 20.00    Types: Cigars, Cigarettes  . Smokeless tobacco: Never Used     Comment: stopped smoking cigarettes in 1991, smoked cigars from 2000 to Jan 2018, 1 cigar per day  . Alcohol use Yes     Comment: 5 drinks a week  . Drug use: No  . Sexual activity: Not Asked   Other Topics Concern  . None   Social History Narrative   ** Merged History Encounter **       Family History  Problem Relation Age of Onset  . Hypertension Father   . Colon cancer Neg Hx   . Heart attack Neg Hx    Allergies  Allergen Reactions  . Plavix [Clopidogrel] Other (See Comments)    Thrombocytopenia-plavix causes low platelets but pt's MD monitors pt closely, and pt does take plavix.    Prior to Admission medications   Medication Sig Start Date End Date Taking? Authorizing Provider  amoxicillin (AMOXIL) 500  MG capsule Take 500 mg by mouth as needed (for dental procedures).  07/24/17  Yes [provider]  aspirin EC 81 MG tablet Take 81 mg by mouth daily.   Yes [provider]  carvedilol (COREG) 6.25 MG tablet Take 1 tablet (6.25 mg total) by mouth 2 (two) times daily. 07/10/17  Yes Dunn, Dayna N, PA-C  clopidogrel (PLAVIX) 75 MG tablet Take 75 mg by mouth daily. 07/13/15  Yes [provider]  Coenzyme Q10 (CO Q 10 PO) Take 1 tablet by mouth daily.   Yes [provider]  fexofenadine (ALLEGRA) 180 MG tablet Take 180 mg by mouth daily.   Yes [provider]  folic acid (FOLVITE) 665 MCG tablet Take 400 mcg by mouth daily.   Yes [provider]  furosemide (LASIX) 40 MG tablet Take 1 tablet (40 mg total) by mouth daily. 08/01/17 10/30/17 Yes Imogene Burn,  PA-C  GLUCOSAMINE-CHONDROITIN PO Take 1 tablet by mouth 2 (two) times daily.   Yes [provider]  lisinopril (PRINIVIL,ZESTRIL) 20 MG tablet Take 1 tablet (20 mg total) by mouth daily. 07/10/17 10/08/17 Yes Dunn, Dayna N, PA-C  metFORMIN (GLUCOPHAGE) 500 MG tablet Take 1,000 mg by mouth 2 (two) times daily with a meal.    Yes [provider]  potassium chloride SA (K-DUR,KLOR-CON) 20 MEQ tablet Take 1 tablet (20 mEq total) by mouth daily. 08/01/17  Yes Imogene Burn, PA-C  pravastatin (PRAVACHOL) 40 MG tablet Take 40 mg by mouth daily.   Yes [provider]  sacubitril-valsartan (ENTRESTO) 49-51 MG Take 1 tablet by mouth 2 (two) times daily. 08/01/17  Yes Imogene Burn, PA-C  vitamin B-12 (CYANOCOBALAMIN) 1000 MCG tablet Take 1,000 mcg by mouth daily.   Yes [provider]     Positive ROS: All other systems have been reviewed and were otherwise negative with the exception of those mentioned in the HPI and as above.  Physical Exam: General: Alert, no acute distress Cardiovascular: No pedal edema Respiratory: No cyanosis, no use of accessory musculature GI: No organomegaly, abdomen is soft and non-tender Skin: No lesions in the area of chief complaint Neurologic: Sensation intact distally Psychiatric: Patient is competent for consent with normal mood and affect Lymphatic: No axillary or cervical lymphadenopathy  MUSCULOSKELETAL: right knee arom 0-125 with crepitance medially and painful arc and varus alignment  Assessment: DJD RIGHT KNEE   Plan: Plan for Procedure(s): UNICOMPARTMENTAL KNEE VERSES TOTAL KNEE ARTHROPLASTY  The risks benefits and alternatives were discussed with the patient including but not limited to the risks of nonoperative treatment, versus surgical intervention including infection, bleeding, nerve injury,  blood clots, cardiopulmonary complications, morbidity, mortality, among others, and they were willing to proceed.    Johnny Bridge, MD Cell [phone]   08/06/2017 7:22 AM

## 2017-08-06 NOTE — Progress Notes (Signed)
PT Cancellation Note  Patient Details Name: Gordon Madden MRN: [id number] DOB: Jun 07, 1951   Cancelled Treatment:    Reason Eval/Treat Not Completed: Pain limiting ability to participate Pt reporting 9/10 pain and reports he does not want it to get any worse, so requesting to hold PT. Educated about importance of movement, however, pt still refusing. Will continue to follow acutely.   Leighton Ruff, PT, DPT  Acute Rehabilitation Services  Pager: [phone]    [name] 08/06/2017, 5:26 PM

## 2017-08-06 NOTE — Anesthesia Procedure Notes (Signed)
Anesthesia Regional Block: Adductor canal block   Pre-Anesthetic Checklist: ,, timeout performed, Correct Patient, Correct Site, Correct Laterality, Correct Procedure, Correct Position, site marked, Risks and benefits discussed,  Surgical consent,  Pre-op evaluation,  At surgeon's request and post-op pain management  Laterality: Right  Prep: chloraprep       Needles:  Injection technique: Single-shot  Needle Type: Echogenic Stimulator Needle     Needle Length: 9cm  Needle Gauge: 21   Needle insertion depth: 7 cm   Additional Needles:   Narrative:  Start time: 08/06/2017 7:15 AM End time: 08/06/2017 7:20 AM Injection made incrementally with aspirations every 5 mL.  Performed by: Personally  Anesthesiologist: Josephine Igo  Additional Notes: Timeout performed. Patient sedated. Relevant anatomy ID'd using [place]. Incremental 2-64ml injection of LA with frequent aspiration. Patient tolerated procedure well.

## 2017-08-06 NOTE — Progress Notes (Signed)
A11 sent for XXL ted hose to materials management.

## 2017-08-06 NOTE — Anesthesia Postprocedure Evaluation (Signed)
Anesthesia Post Note  Patient: Gordon Madden  Procedure(s) Performed: Procedure(s) (LRB): UNICOMPARTMENTAL KNEE (Right)     Patient location during evaluation: PACU Anesthesia Type: General Level of consciousness: oriented and awake and alert Pain management: pain level controlled Vital Signs Assessment: post-procedure vital signs reviewed and stable Respiratory status: spontaneous breathing, respiratory function stable, patient connected to nasal cannula oxygen and nonlabored ventilation Cardiovascular status: blood pressure returned to baseline and stable Postop Assessment: no signs of nausea or vomiting Anesthetic complications: no    Last Vitals:  Vitals:   08/06/17 1045 08/06/17 1100  BP: 135/87   Pulse: 66 63  Resp: 15 18  Temp:    SpO2: 97% 97%    Last Pain:  Vitals:   08/06/17 1100  TempSrc:   PainSc: Asleep                 Irina Okelly A.

## 2017-08-06 NOTE — Care Management Note (Addendum)
Case Management Note  Patient Details  Name: Gordon Madden MRN: [id number] Date of Birth: 23-Jun-1951  Subjective/Objective:                 Spoke w patient at the bedside. Patient admitted from home with wife for knee. He states he has RW and 3/1 at home already. He states he is familiar w [facility] from knee sx two years ago and also gets his CPAp from them he states he wants to use them if needed at DC.    Action/Plan:  CM will follow for DC panning. 9-12 Dc to home w services through [facility]  Expected Discharge Date:                  Expected Discharge Plan:  [place]  In-House Referral:     Discharge planning Services  CM Consult  Post Acute Care Choice:    Choice offered to:     DME Arranged:    DME Agency:     HH Arranged:    [place] Agency:     Status of Service:  In process, will continue to follow  If discussed at Long Length of Stay Meetings, dates discussed:    Additional Comments:  Carles Collet, RN 08/06/2017, 3:20 PM

## 2017-08-06 NOTE — Care Management Obs Status (Signed)
[facility] NOTIFICATION   Patient Details  Name: Gordon Madden MRN: [id number] Date of Birth: 1951-07-09   Medicare Observation Status Notification Given:  Yes    Carles Collet, RN 08/06/2017, 3:20 PM

## 2017-08-06 NOTE — Progress Notes (Signed)
Patient experiencing a lot of pain post op. All pain medications ordered have been utilized with little relief. Paged Joya Gaskins, PA who stated he will talk to Dr. Mardelle Matte to see if he will order or increase current pain regimen. Relayed this message to patient.

## 2017-08-06 NOTE — Anesthesia Procedure Notes (Signed)
Procedure Name: Intubation Date/Time: 08/06/2017 7:37 AM Performed by: Neldon Newport Pre-anesthesia Checklist: Timeout performed, Patient being monitored, Suction available, Emergency Drugs available and Patient identified Patient Re-evaluated:Patient Re-evaluated prior to induction Oxygen Delivery Method: Circle system utilized Preoxygenation: Pre-oxygenation with 100% oxygen Induction Type: IV induction Ventilation: Mask ventilation without difficulty, Oral airway inserted - appropriate to patient size and Two handed mask ventilation required Laryngoscope Size: Mac and 4 (DL per Dr. Royce Macadamia) Grade View: Grade I Tube type: Oral Tube size: 7.5 mm Number of attempts: 1 Placement Confirmation: breath sounds checked- equal and bilateral,  positive ETCO2 and ETT inserted through vocal cords under direct vision Secured at: 22 cm Tube secured with: Tape Dental Injury: Teeth and Oropharynx as per pre-operative assessment

## 2017-08-06 NOTE — Op Note (Signed)
08/06/2017  9:42 AM  PATIENT:  [name]    PRE-OPERATIVE DIAGNOSIS:  Right knee primary localized osteoarthritis  POST-OPERATIVE DIAGNOSIS:  Same  PROCEDURE:  Right unicompartmental knee replacement  SURGEON:  Johnny Bridge, MD  PHYSICIAN ASSISTANT: Joya Gaskins, OPA-C, present and scrubbed throughout the case, critical for completion in a timely fashion, and for retraction, instrumentation, and closure.  ANESTHESIA:   General  ESTIMATED BLOOD LOSS: 100 mL  PREOPERATIVE INDICATIONS:  KIONTE BAUMGARDNER is a  66 y.o. male with a diagnosis of DJD RIGHT KNEE who failed conservative measures and elected for surgical management.    The risks benefits and alternatives were discussed with the patient preoperatively including but not limited to the risks of infection, bleeding, nerve injury, cardiopulmonary complications, blood clots, the need for revision surgery, among others, and the patient was willing to proceed.  OPERATIVE IMPLANTS: Biomet [employer] mobile bearing medial compartment arthroplasty femur size medium, tibia size D, bearing size 4.  OPERATIVE FINDINGS: Endstage grade 4 medial compartment osteoarthritis. He had significant eburnation with hypertrophic osteophyte formation primarily medially, to a lesser degree laterally, he had a little bit of notch osteophyte formation and some osteophytes around the patella. The lateral compartment had a very small, 2 mm chondral lesion that was somewhat central, but not significant enough to warrant conversion to total knee replacement. The patellofemoral joint was fairly symmetric, there was some grade 3 changes, which were offloaded with the unicompartmental replacement. There was some lateral changes on the femoral trochlea as well, but again, the primary disease was medial. The ACL was intact.  Unique aspects of the case: The fence cut may have been directed slightly laterally because of a large osteophyte on the posterior medial femoral  condyle. The tibial tray was between a size D and the size E, however front to back it was definitely not an E.   I removed substantial medial tibial osteophytes, and had complete coverage with the D tray.  The orientation of the femoral component appeared slightly internally rotated compared to the tibial tray, however the final implant tracked very well with no threatening for spin out. The tibial cut was a skim cut, but still yielded a 4 mm gap. There was substantial eburnation on both the tibia and femur with some bone loss medially.   OPERATIVE PROCEDURE: The patient was brought to the operating room placed in supine position. General anesthesia was administered. IV antibiotics were given. The lower extremity was placed in the legholder and prepped and draped in usual sterile fashion.  Time out was performed.  The leg was elevated and exsanguinated and the tourniquet was inflated. Anteromedial incision was performed, and I took care to preserve the MCL. Parapatellar incision was carried out, and the osteophytes were excised, along with the medial meniscus and a small portion of the fat pad.  The extra medullary tibial cutting jig was applied, using the spoon and the 34mm G-Clamp and the 2 mm shim, and I took care to protect the anterior cruciate ligament insertion and the tibial spine. The medial collateral ligament was also protected, and I resected my proximal tibia, matching the anatomic slope.   The proximal tibial bony cut was removed in one piece, and I turned my attention to the femur.  The intramedullary femoral rod was placed using the drill, and then using the appropriate reference, I assembled the femoral jig, setting my posterior cutting block. I resected my posterior femur, used the 0 spigot for the anterior femur,  and then measured my gap.   I then used the appropriate mill to match the extension gap to the flexion gap. The second milling was at a 4.  The gaps were then measured again  with the appropriate feeler gauges. Once I had balanced flexion and extension gaps, I then completed the preparation of the femur.  I milled off the anterior aspect of the distal femur to prevent impingement. I also exposed the tibia, and selected the above-named component, and then used the cutting jig to prepare the keel slot on the tibia. I also used the awl to curette out the bone to complete the preparation of the keel. The back wall was intact.  I then placed trial components, and it was found to have excellent motion, and appropriate balance.  I then cemented the components into place, cementing the tibia first, removing all excess cement, and then cementing the femur.  All loose cement was removed.  The real polyethylene insert was applied manually, and the knee was taken through functional range of motion, and found to have excellent stability and restoration of joint motion, with excellent balance.  The wounds were irrigated copiously, and the parapatellar tissue closed with Vicryl, followed by Vicryl for the subcutaneous tissue, with routine closure with Steri-Strips and sterile gauze.  The tourniquet was released, and the patient was awakened and extubated and returned to PACU in stable and satisfactory condition. There were no complications.

## 2017-08-06 NOTE — Anesthesia Preprocedure Evaluation (Addendum)
Anesthesia Evaluation  Patient identified by MRN, date of birth, ID band Patient awake    Reviewed: Allergy & Precautions, NPO status , Patient's Chart, lab work & pertinent test results  History of Anesthesia Complications (+) PONV and history of anesthetic complications  Airway Mallampati: I  TM Distance: >3 FB Neck ROM: full    Dental  (+) Teeth Intact, Dental Advidsory Given, Caps   Pulmonary shortness of breath and with exertion, sleep apnea and Continuous Positive Airway Pressure Ventilation , pneumonia, resolved, former smoker,    Pulmonary exam normal breath sounds clear to auscultation       Cardiovascular hypertension, Pt. on medications + CAD, + Past MI, + Peripheral Vascular Disease, +CHF and + DOE  Normal cardiovascular exam Rhythm:Regular Rate:Normal  Echo: 06/2017 Technically difficult study. Definity contrast given. LVEF   35-40%, moderately dilated ventricle with normal wall thickness,   global hypokinesis with regional variation inferiorly, grade 1   DD, indeterminate LV filling prsesure, mild MR, moderate biatrial   enlargement, normal IVC. Compared to a prior study in 2015, the   LVEF is reduced.  12 lead EKG: NSR 1st deg AVB, LAFB, inferior and anterolateral infarct   Neuro/Psych CVA    GI/Hepatic negative GI ROS, Neg liver ROS,   Endo/Other  diabetes, Well Controlled, Type 2, Oral Hypoglycemic AgentsMorbid obesityHyperlipidemia  Renal/GU negative Renal ROS   BPH    Musculoskeletal  (+) Arthritis , Osteoarthritis,    Abdominal (+) + obese,   Peds  Hematology Thrombocytopenia   Anesthesia Other Findings   Reproductive/Obstetrics                           Anesthesia Physical Anesthesia Plan  ASA: III  Anesthesia Plan: General   Post-op Pain Management:  Regional for Post-op pain   Induction:   PONV Risk Score and Plan: 3 and Ondansetron, Dexamethasone, Midazolam  and Propofol infusion  Airway Management Planned: Oral ETT  Additional Equipment:   Intra-op Plan:   Post-operative Plan: Extubation in OR  Informed Consent: I have reviewed the patients History and Physical, chart, labs and discussed the procedure including the risks, benefits and alternatives for the proposed anesthesia with the patient or authorized representative who has indicated his/her understanding and acceptance.   Dental advisory given and Dental Advisory Given  Plan Discussed with: CRNA, Anesthesiologist and Surgeon  Anesthesia Plan Comments:       Anesthesia Quick Evaluation

## 2017-08-07 ENCOUNTER — Encounter (HOSPITAL_COMMUNITY): Payer: Self-pay | Admitting: Orthopedic Surgery

## 2017-08-07 DIAGNOSIS — Z8673 Personal history of transient ischemic attack (TIA), and cerebral infarction without residual deficits: Secondary | ICD-10-CM | POA: Diagnosis not present

## 2017-08-07 DIAGNOSIS — M109 Gout, unspecified: Secondary | ICD-10-CM | POA: Diagnosis not present

## 2017-08-07 DIAGNOSIS — M1711 Unilateral primary osteoarthritis, right knee: Secondary | ICD-10-CM | POA: Diagnosis not present

## 2017-08-07 DIAGNOSIS — I5022 Chronic systolic (congestive) heart failure: Secondary | ICD-10-CM | POA: Diagnosis not present

## 2017-08-07 DIAGNOSIS — G4733 Obstructive sleep apnea (adult) (pediatric): Secondary | ICD-10-CM | POA: Diagnosis not present

## 2017-08-07 DIAGNOSIS — I110 Hypertensive heart disease with heart failure: Secondary | ICD-10-CM | POA: Diagnosis not present

## 2017-08-07 DIAGNOSIS — I2510 Atherosclerotic heart disease of native coronary artery without angina pectoris: Secondary | ICD-10-CM | POA: Diagnosis not present

## 2017-08-07 DIAGNOSIS — E785 Hyperlipidemia, unspecified: Secondary | ICD-10-CM | POA: Diagnosis not present

## 2017-08-07 DIAGNOSIS — I252 Old myocardial infarction: Secondary | ICD-10-CM | POA: Diagnosis not present

## 2017-08-07 LAB — BASIC METABOLIC PANEL
Anion gap: 4 — ABNORMAL LOW (ref 5–15)
BUN: 21 mg/dL — AB (ref 6–20)
CALCIUM: 8.5 mg/dL — AB (ref 8.9–10.3)
CHLORIDE: 100 mmol/L — AB (ref 101–111)
CO2: 30 mmol/L (ref 22–32)
CREATININE: 1.08 mg/dL (ref 0.61–1.24)
GFR calc Af Amer: >60 mL/min (ref >60)
GFR calc non Af Amer: >60 mL/min (ref >60)
Glucose, Bld: 198 mg/dL — ABNORMAL HIGH (ref 65–99)
Potassium: 5.1 mmol/L (ref 3.5–5.1)
SODIUM: 134 mmol/L — AB (ref 135–145)

## 2017-08-07 LAB — CBC
HEMATOCRIT: 41.8 % (ref 39.0–52.0)
HEMOGLOBIN: 14 g/dL (ref 13.0–17.0)
MCH: 32.3 pg (ref 26.0–34.0)
MCHC: 33.5 g/dL (ref 30.0–36.0)
MCV: 96.3 fL (ref 78.0–100.0)
Platelets: 84 10*3/uL — ABNORMAL LOW (ref 150–400)
RBC: 4.34 MIL/uL (ref 4.22–5.81)
RDW: 13.4 % (ref 11.5–15.5)
WBC: 9.1 10*3/uL (ref 4.0–10.5)

## 2017-08-07 NOTE — Discharge Summary (Signed)
Physician Discharge Summary  Patient ID: Gordon Madden MRN: [id number] DOB/AGE: 05/05/1951 66 y.o.  Admit date: 08/06/2017 Discharge date: 08/07/2017  Admission Diagnoses:  Primary localized osteoarthritis of right knee  Discharge Diagnoses:  Principal Problem:   Primary localized osteoarthritis of right knee Active Problems:   S/P knee replacement   Past Medical History:  Diagnosis Date  . Arthritis   . Ascending aorta dilatation (HCC)   . BPH (benign prostatic hyperplasia)   . CAD (coronary artery disease)    a. MI 2004 with occ RCA.  . Cardiomyopathy    EF 40%  . Chronic systolic CHF (congestive heart failure) ([place])   . Colon polyps   . CVA (cerebral infarction)    Renal artery branch occlusion  . Diabetes mellitus, type 2 ([place])   . History of gout   . Hyperlipidemia   . Hypertension   . Idiopathic thrombocytopenia ([place])   . Morbid obesity ([place])   . Myocardial infarction ([place])   . OSA (obstructive sleep apnea)    07/25/10 AHI 14.9/hr  . Pneumonia   . PONV (postoperative nausea and vomiting)   . Primary localized osteoarthritis of left knee 06/07/2015  . Primary localized osteoarthritis of right knee 08/06/2017  . Rhinosinusitis   . Shortness of breath dyspnea   . Stroke ([place])   . Vision decreased     Surgeries: Procedure(s): UNICOMPARTMENTAL KNEE on 08/06/2017   Consultants (if any):   Discharged Condition: Improved  Hospital Course: Gordon Madden is an 66 y.o. male who was admitted 08/06/2017 with a diagnosis of Primary localized osteoarthritis of right knee and went to the operating room on 08/06/2017 and underwent the above named procedures.    He was given perioperative antibiotics:  Anti-infectives    Start     Dose/Rate Route Frequency Ordered Stop   08/06/17 1600  ceFAZolin (ANCEF) IVPB 2g/100 mL premix     2 g 200 mL/hr over 30 Minutes Intravenous Every 6 hours 08/06/17 1439 08/07/17 0056   08/06/17 0600  ceFAZolin (ANCEF) 3 g in dextrose 5 % 50 mL  IVPB     3 g 130 mL/hr over 30 Minutes Intravenous On call to O.R. 08/05/17 1349 08/06/17 0750    .  He was given sequential compression devices, early ambulation, and xarelto for DVT prophylaxis.  He benefited maximally from the hospital stay and there were no complications.    Recent vital signs:  Vitals:   08/06/17 2019 08/07/17 0504  BP: 137/74 118/69  Pulse: 80 90  Resp: 16 18  Temp: 98.3 F (36.8 C) 98.5 F (36.9 C)  SpO2: 97% 98%    Recent laboratory studies:  Lab Results  Component Value Date   HGB 14.0 08/07/2017   HGB 15.5 07/30/2017   HGB 15.4 07/26/2017   Lab Results  Component Value Date   WBC 9.1 08/07/2017   PLT 84 (L) 08/07/2017   Lab Results  Component Value Date   INR 1.04 01/30/2010   Lab Results  Component Value Date   NA 134 (L) 08/07/2017   K 5.1 08/07/2017   CL 100 (L) 08/07/2017   CO2 30 08/07/2017   BUN 21 (H) 08/07/2017   CREATININE 1.08 08/07/2017   GLUCOSE 198 (H) 08/07/2017    Discharge Medications:   Allergies as of 08/07/2017      Reactions   Plavix [clopidogrel] Other (See Comments)   Thrombocytopenia-plavix causes low platelets but pt's MD monitors pt closely, and pt does take plavix.  Medication List    STOP taking these medications   amoxicillin 500 MG capsule Commonly known as:  AMOXIL   aspirin EC 81 MG tablet   clopidogrel 75 MG tablet Commonly known as:  PLAVIX     TAKE these medications   baclofen 10 MG tablet Commonly known as:  LIORESAL Take 1 tablet (10 mg total) by mouth 3 (three) times daily. As needed for muscle spasm   carvedilol 6.25 MG tablet Commonly known as:  COREG Take 1 tablet (6.25 mg total) by mouth 2 (two) times daily.   CO Q 10 PO Take 1 tablet by mouth daily.   fexofenadine 180 MG tablet Commonly known as:  ALLEGRA Take 180 mg by mouth daily.   folic acid 440 MCG tablet Commonly known as:  FOLVITE Take 400 mcg by mouth daily.   furosemide 40 MG tablet Commonly known  as:  LASIX Take 1 tablet (40 mg total) by mouth daily.   GLUCOSAMINE-CHONDROITIN PO Take 1 tablet by mouth 2 (two) times daily.   metFORMIN 500 MG tablet Commonly known as:  GLUCOPHAGE Take 1,000 mg by mouth 2 (two) times daily with a meal.   ondansetron 4 MG tablet Commonly known as:  ZOFRAN Take 1 tablet (4 mg total) by mouth every 8 (eight) hours as needed for nausea or vomiting.   oxyCODONE 5 MG immediate release tablet Commonly known as:  ROXICODONE Take 1-2 tablets (5-10 mg total) by mouth every 4 (four) hours as needed for severe pain.   potassium chloride SA 20 MEQ tablet Commonly known as:  K-DUR,KLOR-CON Take 1 tablet (20 mEq total) by mouth daily.   pravastatin 40 MG tablet Commonly known as:  PRAVACHOL Take 40 mg by mouth daily.   rivaroxaban 10 MG Tabs tablet Commonly known as:  XARELTO Take 1 tablet (10 mg total) by mouth daily.   sacubitril-valsartan 49-51 MG Commonly known as:  ENTRESTO Take 1 tablet by mouth 2 (two) times daily.   sennosides-docusate sodium 8.6-50 MG tablet Commonly known as:  SENOKOT-S Take 2 tablets by mouth daily.   vitamin B-12 1000 MCG tablet Commonly known as:  CYANOCOBALAMIN Take 1,000 mcg by mouth daily.            Discharge Care Instructions        Start     Ordered   08/06/17 0000  ondansetron (ZOFRAN) 4 MG tablet  Every 8 hours PRN     08/06/17 0950   08/06/17 0000  baclofen (LIORESAL) 10 MG tablet  3 times daily     08/06/17 0950   08/06/17 0000  sennosides-docusate sodium (SENOKOT-S) 8.6-50 MG tablet  Daily     08/06/17 0950   08/06/17 0000  rivaroxaban (XARELTO) 10 MG TABS tablet  Daily     08/06/17 0950   08/06/17 0000  oxyCODONE (ROXICODONE) 5 MG immediate release tablet  Every 4 hours PRN     08/06/17 0950      Diagnostic Studies: Dg Knee Right Port  Result Date: 08/06/2017 CLINICAL DATA:  Status post right unicompartmental knee replacement EXAM: PORTABLE RIGHT KNEE - 1-2 VIEW COMPARISON:  None in  PACs FINDINGS: The patient has under gone medial knee joint prosthesis placement. Radiographic positioning of the prosthetic components is good. There is fluid and gas within the anterior soft tissues. There is mild narrowing of the lateral joint compartment. There is beaking of the tibial spines. Spurs arise from the articular margins of the lateral femoral condyle and lateral tibial plateau.  IMPRESSION: No immediate postprocedure complication following medial compartment knee replacement. Electronically Signed   By: David  Martinique M.D.   On: 08/06/2017 11:13    Disposition: 03-Skilled Nursing Facility    Follow-up Information    [name], MD. Schedule an appointment as soon as possible for a visit in 2 weeks.   Specialty:  Orthopedic Surgery Contact information: [place] [address] [phone]            Signed: Johnny Bridge 08/07/2017, 10:35 AM

## 2017-08-07 NOTE — Evaluation (Signed)
Occupational Therapy Evaluation Patient Details Name: LINARD DAFT MRN: [id number] DOB: 1951-01-28 Today's Date: 08/07/2017    History of Present Illness Patient is a 66 y/o male s/p R unicompartmental knee arthroscopy.  PMH includes DM, HTN, OSA, CVA, gout, CHF, MI, L TKA and HLD.    Clinical Impression   Pt reports he was independent with ADL PTA. Currently pt requires min guard for functional mobility and mod assist for LB ADL. Pt planning to d/c home with 24/7 supervision from family. Pt would benefit from continued skilled OT to address established goals.    Follow Up Recommendations  DC plan and follow up therapy as arranged by surgeon;Supervision/Assistance - 24 hour    Equipment Recommendations  None recommended by OT    Recommendations for Other Services       Precautions / Restrictions Precautions Precautions: Fall;Knee Precaution Booklet Issued: Yes (comment) Restrictions Weight Bearing Restrictions: Yes RLE Weight Bearing: Weight bearing as tolerated      Mobility Bed Mobility Overal bed mobility: Needs Assistance Bed Mobility: Supine to Sit;Sit to Supine     Supine to sit: Min assist Sit to supine: Min assist   General bed mobility comments: Assist for RLE to EOB and back to bed. Cues for use of bed rails and technique.  Transfers Overall transfer level: Needs assistance Equipment used: Rolling walker (2 wheeled) Transfers: Sit to/from Stand Sit to Stand: Min guard         General transfer comment: Cues for hand placement and technique    Balance Overall balance assessment: Needs assistance Sitting-balance support: Feet supported;No upper extremity supported Sitting balance-Leahy Scale: Good     Standing balance support: No upper extremity supported;During functional activity Standing balance-Leahy Scale: Fair Standing balance comment: able to stand at sink and wash hands without UE support                           ADL either  performed or assessed with clinical judgement   ADL Overall ADL's : Needs assistance/impaired Eating/Feeding: Set up;Sitting   Grooming: Min guard;Standing;Wash/dry hands   Upper Body Bathing: Set up;Supervision/ safety;Sitting   Lower Body Bathing: Moderate assistance;Sit to/from stand   Upper Body Dressing : Set up;Supervision/safety;Sitting   Lower Body Dressing: Moderate assistance;Sit to/from stand Lower Body Dressing Details (indicate cue type and reason): Educated on R leg into clothing first. Wife to assist as needed Toilet Transfer: Min guard;Ambulation;RW Toilet Transfer Details (indicate cue type and reason): Pt able to stand at toilet to void with min guard         Functional mobility during ADLs: Min guard;Rolling walker       Vision         Perception     Praxis      Pertinent Vitals/Pain Pain Assessment: 0-10 Pain Score: 5  Pain Location: R knee Pain Descriptors / Indicators: Aching;Sore Pain Intervention(s): Limited activity within patient's tolerance;Monitored during session;Repositioned;Ice applied     Hand Dominance     Extremity/Trunk Assessment Upper Extremity Assessment Upper Extremity Assessment: Overall WFL for tasks assessed   Lower Extremity Assessment Lower Extremity Assessment: Defer to PT evaluation        Communication Communication Communication: No difficulties   Cognition Arousal/Alertness: Awake/alert Behavior During Therapy: WFL for tasks assessed/performed Overall Cognitive Status: Within Functional Limits for tasks assessed  General Comments    Exercises   Shoulder Instructions      Home Living Family/patient expects to be discharged to:: Private residence Living Arrangements: Spouse/significant other Available Help at Discharge: Family Type of Home: House Home Access: Stairs to enter [employer] of Steps: 2 + 2 walker will fit on all but one  step Entrance Stairs-Rails: None Home Layout: One level     Bathroom Shower/Tub: Occupational psychologist: Standard     Home Equipment: Environmental consultant - 2 wheels;Bedside commode;Crutches;Shower seat;Grab bars - tub/shower;Hand held shower head;Grab bars - toilet          Prior Functioning/Environment Level of Independence: Independent                 OT Problem List: Decreased strength;Decreased range of motion;Decreased activity tolerance;Impaired balance (sitting and/or standing);Decreased knowledge of use of DME or AE;Decreased knowledge of precautions;Obesity;Pain      OT Treatment/Interventions: Self-care/ADL training;Energy conservation;DME and/or AE instruction;Therapeutic activities;Patient/family education;Balance training    OT Goals(Current goals can be found in the care plan section) Acute Rehab OT Goals Patient Stated Goal: To go home today OT Goal Formulation: With patient Time For Goal Achievement: 08/21/17 Potential to Achieve Goals: Good ADL Goals Pt Will Perform Lower Body Bathing: with supervision;sit to/from stand Pt Will Perform Lower Body Dressing: with supervision;sit to/from stand Pt Will Transfer to Toilet: with supervision;ambulating;regular height toilet Pt Will Perform Toileting - Clothing Manipulation and hygiene: with supervision;sit to/from stand Pt Will Perform Tub/Shower Transfer: Shower transfer;with supervision;ambulating;grab bars;rolling walker  OT Frequency: Min 2X/week   Barriers to D/C:            Co-evaluation              AM-PAC PT "6 Clicks" Daily Activity     Outcome Measure Help from another person eating meals?: None Help from another person taking care of personal grooming?: A Little Help from another person toileting, which includes using toliet, bedpan, or urinal?: A Little Help from another person bathing (including washing, rinsing, drying)?: A Lot Help from another person to put on and taking off regular  upper body clothing?: A Little Help from another person to put on and taking off regular lower body clothing?: A Lot 6 Click Score: 17   End of Session Equipment Utilized During Treatment: Rolling walker Nurse Communication: Mobility status;Other (comment) (SpO2 not reading correctly)  Activity Tolerance: Patient tolerated treatment well Patient left: in bed;with call bell/phone within reach  OT Visit Diagnosis: Unsteadiness on feet (R26.81);Other abnormalities of gait and mobility (R26.89)                Time: 3149-7026 OT Time Calculation (min): 20 min Charges:  OT General Charges $OT Visit: 1 Visit OT Evaluation $OT Eval Moderate Complexity: 1 Mod G-Codes: OT G-codes **NOT FOR INPATIENT CLASS** Functional Assessment Tool Used: Clinical judgement Functional Limitation: Self care Self Care Current Status (V7858): At least 20 percent but less than 40 percent impaired, limited or restricted Self Care Goal Status (I5027): At least 1 percent but less than 20 percent impaired, limited or restricted   Mel Almond A. Ulice Brilliant, M.S., OTR/L Pager: [place] 08/07/2017, 1:38 PM

## 2017-08-07 NOTE — Progress Notes (Signed)
Physical Therapy Treatment Patient Details Name: Gordon Madden MRN: [id number] DOB: 08/12/1951 Today's Date: 08/07/2017    History of Present Illness Patient is a 66 y/o male s/p R unicompartmental knee arthroscopy.  PMH includes DM, HTN, OSA, CVA, gout, CHF, MI, L TKA and HLD.     PT Comments    Patient able to negotiate step appropriate for home entry.  Set up for HHPT and wife able to assist.  No further skilled acute PT needs.   Follow Up Recommendations  Home health PT;DC plan and follow up therapy as arranged by surgeon     Equipment Recommendations  None recommended by PT    Recommendations for Other Services       Precautions / Restrictions Precautions Precautions: Fall;Knee Restrictions RLE Weight Bearing: Weight bearing as tolerated    Mobility  Bed Mobility               General bed mobility comments: up dressed ready for d/c  Transfers   Equipment used: Rolling walker (2 wheeled) Transfers: Sit to/from Stand Sit to Stand: Supervision         General transfer comment: cues to sit on EOB for hand placement, LE management  Ambulation/Gait Ambulation/Gait assistance: Supervision Ambulation Distance (Feet): 12 Feet Assistive device: Rolling walker (2 wheeled) Gait Pattern/deviations: Step-to pattern;Decreased stride length;Antalgic     General Gait Details: in room with RW, shoes on for d/c, assist for safety   Stairs Stairs: Yes   Stair Management: With walker;Backwards Number of Stairs: 1 General stair comments: demonstrated and then pt performed with RW up with reverse technique, wife present for education as well  Wheelchair Mobility    Modified Rankin (Stroke Patients Only)       Balance Overall balance assessment: Needs assistance Sitting-balance support: Feet supported;No upper extremity supported Sitting balance-Leahy Scale: Good       Standing balance-Leahy Scale: Fair                               Cognition Arousal/Alertness: Awake/alert Behavior During Therapy: WFL for tasks assessed/performed Overall Cognitive Status: Within Functional Limits for tasks assessed                                        Exercises      General Comments General comments (skin integrity, edema, etc.): educated about timing for HEP      Pertinent Vitals/Pain Pain Score: 5  Pain Location: R knee Pain Descriptors / Indicators: Aching;Sore Pain Intervention(s): Monitored during session    Home Living                      Prior Function            PT Goals (current goals can now be found in the care plan section) Progress towards PT goals: Progressing toward goals    Frequency    7X/week      PT Plan Current plan remains appropriate    Co-evaluation              AM-PAC PT "6 Clicks" Daily Activity  Outcome Measure  Difficulty turning over in bed (including adjusting bedclothes, sheets and blankets)?: A Little Difficulty moving from lying on back to sitting on the side of the bed? : A Little Difficulty sitting down on and standing  up from a chair with arms (e.g., wheelchair, bedside commode, etc,.)?: A Little Help needed moving to and from a bed to chair (including a wheelchair)?: A Little Help needed walking in hospital room?: A Little Help needed climbing 3-5 steps with a railing? : A Little 6 Click Score: 18    End of Session   Activity Tolerance: Patient tolerated treatment well Patient left: with call bell/phone within reach;with family/visitor present;in bed (seated EOB)   PT Visit Diagnosis: Difficulty in walking, not elsewhere classified (R26.2);Pain Pain - Right/Left: Right Pain - part of body: Knee     Time: 1415-1436 PT Time Calculation (min) (ACUTE ONLY): 21 min  Charges:  $Gait Training: 8-22 mins                    G CodesMagda Kiel, [place] [phone] 08/07/2017    Reginia Naas 08/07/2017, 5:16 PM

## 2017-08-07 NOTE — Progress Notes (Signed)
Patient ID: Gordon Madden, male   DOB: 1951-08-22, 66 y.o.   MRN: [id number]     Subjective:  Patient reports pain as mild to moderate.  Patient better today with medication adjustments  Objective:   VITALS:   Vitals:   08/06/17 1400 08/06/17 1415 08/06/17 2019 08/07/17 0504  BP: 130/89  137/74 118/69  Pulse: 65 78 80 90  Resp: 19 14 16 18   Temp:   98.3 F (36.8 C) 98.5 F (36.9 C)  TempSrc:   Oral Oral  SpO2: 98% 98% 97% 98%  Weight:      Height:        ABD soft Sensation intact distally Dorsiflexion/Plantar flexion intact Incision: dressing C/D/I and no drainage   Lab Results  Component Value Date   WBC 9.1 08/07/2017   HGB 14.0 08/07/2017   HCT 41.8 08/07/2017   MCV 96.3 08/07/2017   PLT 84 (L) 08/07/2017   BMET    Component Value Date/Time   NA 134 (L) 08/07/2017 0413   NA 141 05/17/2017 0836   NA 137 05/09/2015 1111   K 5.1 08/07/2017 0413   K 4.7 05/09/2015 1111   CL 100 (L) 08/07/2017 0413   CO2 30 08/07/2017 0413   CO2 22 05/09/2015 1111   GLUCOSE 198 (H) 08/07/2017 0413   GLUCOSE 180 (H) 05/09/2015 1111   BUN 21 (H) 08/07/2017 0413   BUN 16 05/17/2017 0836   BUN 15.2 05/09/2015 1111   CREATININE 1.08 08/07/2017 0413   CREATININE 0.8 05/09/2015 1111   CALCIUM 8.5 (L) 08/07/2017 0413   CALCIUM 8.7 05/09/2015 1111   GFRNONAA >60 08/07/2017 0413   GFRAA >60 08/07/2017 0413     Assessment/Plan: 1 Day Post-Op   Principal Problem:   Primary localized osteoarthritis of right knee Active Problems:   S/P knee replacement   Advance diet Up with therapy Plan for discharge tomorrow  Try to use more of the oral medications and stay more on schedule Continue to monitor platelets and sodium both are low WBAT Dry dressing PRN   Remonia Richter 08/07/2017, 9:04 AM  Discussed with Erlene Quan and Patient, he might be ready for discharge later today if pain controlled and passes PT.  Platelets are at baseline, mild hyponatremia ok to f/u as  outpatient if any symptoms arise.    [name], MD Cell [phone]

## 2017-08-07 NOTE — Discharge Instructions (Signed)
INSTRUCTIONS AFTER JOINT REPLACEMENT  ° °o Remove items at home which could result in a fall. This includes throw rugs or furniture in walking pathways °o ICE to the affected joint every three hours while awake for 30 minutes at a time, for at least the first 3-5 days, and then as needed for pain and swelling.  Continue to use ice for pain and swelling. You may notice swelling that will progress down to the foot and ankle.  This is normal after surgery.  Elevate your leg when you are not up walking on it.   °o Continue to use the breathing machine you got in the hospital (incentive spirometer) which will help keep your temperature down.  It is common for your temperature to cycle up and down following surgery, especially at night when you are not up moving around and exerting yourself.  The breathing machine keeps your lungs expanded and your temperature down. ° ° °DIET:  As you were doing prior to hospitalization, we recommend a well-balanced diet. ° °DRESSING / WOUND CARE / SHOWERING ° °You may change your dressing 3-5 days after surgery.  Then change the dressing every day with sterile gauze.  Please use good hand washing techniques before changing the dressing.  Do not use any lotions or creams on the incision until instructed by your surgeon. ° °ACTIVITY ° °o Increase activity slowly as tolerated, but follow the weight bearing instructions below.   °o No driving for 6 weeks or until further direction given by your physician.  You cannot drive while taking narcotics.  °o No lifting or carrying greater than 10 lbs. until further directed by your surgeon. °o Avoid periods of inactivity such as sitting longer than an hour when not asleep. This helps prevent blood clots.  °o You may return to work once you are authorized by your doctor.  ° ° ° °WEIGHT BEARING  ° °Weight bearing as tolerated with assist device (walker, cane, etc) as directed, use it as long as suggested by your surgeon or therapist, typically at  least 4-6 weeks. ° ° °EXERCISES ° °Results after joint replacement surgery are often greatly improved when you follow the exercise, range of motion and muscle strengthening exercises prescribed by your doctor. Safety measures are also important to protect the joint from further injury. Any time any of these exercises cause you to have increased pain or swelling, decrease what you are doing until you are comfortable again and then slowly increase them. If you have problems or questions, call your caregiver or physical therapist for advice.  ° °Rehabilitation is important following a joint replacement. After just a few days of immobilization, the muscles of the leg can become weakened and shrink (atrophy).  These exercises are designed to build up the tone and strength of the thigh and leg muscles and to improve motion. Often times heat used for twenty to thirty minutes before working out will loosen up your tissues and help with improving the range of motion but do not use heat for the first two weeks following surgery (sometimes heat can increase post-operative swelling).  ° °These exercises can be done on a training (exercise) mat, on the floor, on a table or on a bed. Use whatever works the best and is most comfortable for you.    Use music or television while you are exercising so that the exercises are a pleasant break in your day. This will make your life better with the exercises acting as a break   in your routine that you can look forward to.   Perform all exercises about fifteen times, three times per day or as directed.  You should exercise both the operative leg and the other leg as well. ° °Exercises include: °  °• Quad Sets - Tighten up the muscle on the front of the thigh (Quad) and hold for 5-10 seconds.   °• Straight Leg Raises - With your knee straight (if you were given a brace, keep it on), lift the leg to 60 degrees, hold for 3 seconds, and slowly lower the leg.  Perform this exercise against  resistance later as your leg gets stronger.  °• Leg Slides: Lying on your back, slowly slide your foot toward your buttocks, bending your knee up off the floor (only go as far as is comfortable). Then slowly slide your foot back down until your leg is flat on the floor again.  °• Angel Wings: Lying on your back spread your legs to the side as far apart as you can without causing discomfort.  °• Hamstring Strength:  Lying on your back, push your heel against the floor with your leg straight by tightening up the muscles of your buttocks.  Repeat, but this time bend your knee to a comfortable angle, and push your heel against the floor.  You may put a pillow under the heel to make it more comfortable if necessary.  ° °A rehabilitation program following joint replacement surgery can speed recovery and prevent re-injury in the future due to weakened muscles. Contact your doctor or a physical therapist for more information on knee rehabilitation.  ° ° °CONSTIPATION ° °Constipation is defined medically as fewer than three stools per week and severe constipation as less than one stool per week.  Even if you have a regular bowel pattern at home, your normal regimen is likely to be disrupted due to multiple reasons following surgery.  Combination of anesthesia, postoperative narcotics, change in appetite and fluid intake all can affect your bowels.  ° °YOU MUST use at least one of the following options; they are listed in order of increasing strength to get the job done.  They are all available over the counter, and you may need to use some, POSSIBLY even all of these options:   ° °Drink plenty of fluids (prune juice may be helpful) and high fiber foods °Colace 100 mg by mouth twice a day  °Senokot for constipation as directed and as needed Dulcolax (bisacodyl), take with full glass of water  °Miralax (polyethylene glycol) once or twice a day as needed. ° °If you have tried all these things and are unable to have a bowel  movement in the first 3-4 days after surgery call either your surgeon or your primary doctor.   ° °If you experience loose stools or diarrhea, hold the medications until you stool forms back up.  If your symptoms do not get better within 1 week or if they get worse, check with your doctor.  If you experience "the worst abdominal pain ever" or develop nausea or vomiting, please contact the office immediately for further recommendations for treatment. ° ° °ITCHING:  If you experience itching with your medications, try taking only a single pain pill, or even half a pain pill at a time.  You can also use Benadryl over the counter for itching or also to help with sleep.  ° °TED HOSE STOCKINGS:  Use stockings on both legs until for at least 2 weeks or as   directed by physician office. They may be removed at night for sleeping. ° °MEDICATIONS:  See your medication summary on the “After Visit Summary” that nursing will review with you.  You may have some home medications which will be placed on hold until you complete the course of blood thinner medication.  It is important for you to complete the blood thinner medication as prescribed. ° °PRECAUTIONS:  If you experience chest pain or shortness of breath - call 911 immediately for transfer to the hospital emergency department.  ° °If you develop a fever greater that 101 F, purulent drainage from wound, increased redness or drainage from wound, foul odor from the wound/dressing, or calf pain - CONTACT YOUR SURGEON.   °                                                °FOLLOW-UP APPOINTMENTS:  If you do not already have a post-op appointment, please call the office for an appointment to be seen by your surgeon.  Guidelines for how soon to be seen are listed in your “After Visit Summary”, but are typically between 1-4 weeks after surgery. ° ° °MAKE SURE YOU:  °• Understand these instructions.  °• Get help right away if you are not doing well or get worse.  ° ° °Thank you for  letting us be a part of your medical care team.  It is a privilege we respect greatly.  We hope these instructions will help you stay on track for a fast and full recovery!  ° °Information on my medicine - XARELTO® (Rivaroxaban) ° °Why was Xarelto® prescribed for you? °Xarelto® was prescribed for you to reduce the risk of blood clots forming after orthopedic surgery. The medical term for these abnormal blood clots is venous thromboembolism (VTE). ° °What do you need to know about xarelto® ? °Take your Xarelto® ONCE DAILY at the same time every day. °You may take it either with or without food. ° °If you have difficulty swallowing the tablet whole, you may crush it and mix in applesauce just prior to taking your dose. ° °Take Xarelto® exactly as prescribed by your doctor and DO NOT stop taking Xarelto® without talking to the doctor who prescribed the medication.  Stopping without other VTE prevention medication to take the place of Xarelto® may increase your risk of developing a clot. ° °After discharge, you should have regular check-up appointments with your healthcare provider that is prescribing your Xarelto®.   ° °What do you do if you miss a dose? °If you miss a dose, take it as soon as you remember on the same day then continue your regularly scheduled once daily regimen the next day. Do not take two doses of Xarelto® on the same day.  ° °Important Safety Information °A possible side effect of Xarelto® is bleeding. You should call your healthcare provider right away if you experience any of the following: °? Bleeding from an injury or your nose that does not stop. °? Unusual colored urine (red or dark brown) or unusual colored stools (red or black). °? Unusual bruising for unknown reasons. °? A serious fall or if you hit your head (even if there is no bleeding). ° °Some medicines may interact with Xarelto® and might increase your risk of bleeding while on Xarelto®. To help avoid this, consult your healthcare  provider   or pharmacist prior to using any new prescription or non-prescription medications, including herbals, vitamins, non-steroidal anti-inflammatory drugs (NSAIDs) and supplements. ° °This website has more information on Xarelto®: www.xarelto.com. ° ° °

## 2017-08-07 NOTE — Evaluation (Signed)
Physical Therapy Evaluation Patient Details Name: Gordon Madden MRN: [id number] DOB: [birth year] Today's Date: 08/07/2017   History of Present Illness  Patient is a 66 y/o male s/p R unicompartmental knee arthroscopy.  PMH includes DM, HTN, OSA, CVA, gout, CHF, MI, L TKA and HLD.   Clinical Impression  Patient presents with decreased independence with mobility due to deficits listed in PT problem list.  He currently needs min A for mobility, has weakness in R LE, but should progress to home possibly later today with follow up HHPT.  Will follow up to practice stairs and review HEP/activity guidelines.     Follow Up Recommendations Home health PT;DC plan and follow up therapy as arranged by surgeon    Equipment Recommendations  None recommended by PT    Recommendations for Other Services       Precautions / Restrictions Precautions Precautions: Fall;Knee Precaution Booklet Issued: Yes (comment) Restrictions Weight Bearing Restrictions: No RLE Weight Bearing: Weight bearing as tolerated      Mobility  Bed Mobility Overal bed mobility: Needs Assistance Bed Mobility: Supine to Sit     Supine to sit: HOB elevated;Supervision     General bed mobility comments: increased time, rails for support  Transfers Overall transfer level: Needs assistance Equipment used: Rolling walker (2 wheeled) Transfers: Sit to/from Stand Sit to Stand: Min assist         General transfer comment: cues for hand placement, assist for balance  Ambulation/Gait Ambulation/Gait assistance: Min guard Ambulation Distance (Feet): 90 Feet Assistive device: Rolling walker (2 wheeled) Gait Pattern/deviations: Step-to pattern;Decreased step length - left;Decreased stance time - right;Antalgic;Trunk flexed;Wide base of support     General Gait Details: shuffling L LE forward initially due to weakness, pain R LE, improved with cues and practice  Stairs            Wheelchair Mobility     Modified Rankin (Stroke Patients Only)       Balance Overall balance assessment: Needs assistance   Sitting balance-Leahy Scale: Good       Standing balance-Leahy Scale: Fair Standing balance comment: can lift walker for height adjustment in hallway                             Pertinent Vitals/Pain Pain Assessment: 0-10 Pain Score: 7  Pain Location: R knee Pain Descriptors / Indicators: Aching Pain Intervention(s): Monitored during session;Repositioned    Home Living Family/patient expects to be discharged to:: Private residence Living Arrangements: Spouse/significant other Available Help at Discharge: Family Type of Home: House Home Access: Stairs to enter Entrance Stairs-Rails: None Entrance Stairs-Number of Steps: 2 + 2 walker will fit on all but one step Home Layout: One level Home Equipment: Walker - 2 wheels;Bedside commode;Crutches;Shower seat;Grab bars - tub/shower;Hand held shower head      Prior Function Level of Independence: Independent               Hand Dominance   Dominant Hand: Right    Extremity/Trunk Assessment   Upper Extremity Assessment Upper Extremity Assessment: Overall WFL for tasks assessed    Lower Extremity Assessment Lower Extremity Assessment: RLE deficits/detail RLE Deficits / Details: AAROM knee flexion about 60 in supine, extension to 10, strength limited quad with lad on SLR       Communication   Communication: No difficulties  Cognition Arousal/Alertness: Awake/alert Behavior During Therapy: WFL for tasks assessed/performed Overall Cognitive Status: Within Functional Limits for tasks  assessed                                        General Comments General comments (skin integrity, edema, etc.): wife arrived during session, pt hopeful for home today    Exercises Total Joint Exercises Ankle Circles/Pumps: AROM;Both;10 reps;Supine Quad Sets: AROM;Left;10 reps;Supine Short Arc Quad:  AROM;AAROM;Left;10 reps;Supine Heel Slides: AAROM;Left;10 reps;Supine Hip ABduction/ADduction: AROM;Both;Supine Straight Leg Raises: AAROM;Left;10 reps;Supine   Assessment/Plan    PT Assessment Patient needs continued PT services  PT Problem List Decreased strength;Decreased range of motion;Pain;Decreased knowledge of use of DME;Decreased mobility;Decreased activity tolerance       PT Treatment Interventions DME instruction;Gait training;Therapeutic exercise;Patient/family education;Stair training;Functional mobility training;Therapeutic activities    PT Goals (Current goals can be found in the Care Plan section)  Acute Rehab PT Goals Patient Stated Goal: To go home today PT Goal Formulation: With patient Time For Goal Achievement: 08/09/17 Potential to Achieve Goals: Good    Frequency 7X/week   Barriers to discharge        Co-evaluation               AM-PAC PT "6 Clicks" Daily Activity  Outcome Measure Difficulty turning over in bed (including adjusting bedclothes, sheets and blankets)?: A Little Difficulty moving from lying on back to sitting on the side of the bed? : A Little Difficulty sitting down on and standing up from a chair with arms (e.g., wheelchair, bedside commode, etc,.)?: Unable Help needed moving to and from a bed to chair (including a wheelchair)?: A Little Help needed walking in hospital room?: A Little Help needed climbing 3-5 steps with a railing? : A Little 6 Click Score: 16    End of Session Equipment Utilized During Treatment: Gait belt Activity Tolerance: Patient limited by fatigue Patient left: in chair;with call bell/phone within reach;with family/visitor present   PT Visit Diagnosis: Difficulty in walking, not elsewhere classified (R26.2);Pain Pain - Right/Left: Right Pain - part of body: Knee    Time: 6294-7654 PT Time Calculation (min) (ACUTE ONLY): 35 min   Charges:   PT Evaluation $PT Eval Moderate Complexity: 1 Mod PT  Treatments $Gait Training: 8-22 mins   PT G CodesMagda Kiel, [place] [phone] 08/07/2017   Reginia Naas 08/07/2017, 9:52 AM

## 2017-08-08 DIAGNOSIS — Z471 Aftercare following joint replacement surgery: Secondary | ICD-10-CM | POA: Diagnosis not present

## 2017-08-08 DIAGNOSIS — I2510 Atherosclerotic heart disease of native coronary artery without angina pectoris: Secondary | ICD-10-CM | POA: Diagnosis not present

## 2017-08-08 DIAGNOSIS — I429 Cardiomyopathy, unspecified: Secondary | ICD-10-CM | POA: Diagnosis not present

## 2017-08-08 DIAGNOSIS — D696 Thrombocytopenia, unspecified: Secondary | ICD-10-CM | POA: Diagnosis not present

## 2017-08-08 DIAGNOSIS — M1990 Unspecified osteoarthritis, unspecified site: Secondary | ICD-10-CM | POA: Diagnosis not present

## 2017-08-08 DIAGNOSIS — G4733 Obstructive sleep apnea (adult) (pediatric): Secondary | ICD-10-CM | POA: Diagnosis not present

## 2017-08-08 DIAGNOSIS — I110 Hypertensive heart disease with heart failure: Secondary | ICD-10-CM | POA: Diagnosis not present

## 2017-08-08 DIAGNOSIS — I5022 Chronic systolic (congestive) heart failure: Secondary | ICD-10-CM | POA: Diagnosis not present

## 2017-08-08 DIAGNOSIS — E119 Type 2 diabetes mellitus without complications: Secondary | ICD-10-CM | POA: Diagnosis not present

## 2017-08-12 DIAGNOSIS — D696 Thrombocytopenia, unspecified: Secondary | ICD-10-CM | POA: Diagnosis not present

## 2017-08-12 DIAGNOSIS — G4733 Obstructive sleep apnea (adult) (pediatric): Secondary | ICD-10-CM | POA: Diagnosis not present

## 2017-08-12 DIAGNOSIS — I2510 Atherosclerotic heart disease of native coronary artery without angina pectoris: Secondary | ICD-10-CM | POA: Diagnosis not present

## 2017-08-12 DIAGNOSIS — I429 Cardiomyopathy, unspecified: Secondary | ICD-10-CM | POA: Diagnosis not present

## 2017-08-12 DIAGNOSIS — Z471 Aftercare following joint replacement surgery: Secondary | ICD-10-CM | POA: Diagnosis not present

## 2017-08-12 DIAGNOSIS — E119 Type 2 diabetes mellitus without complications: Secondary | ICD-10-CM | POA: Diagnosis not present

## 2017-08-12 DIAGNOSIS — I5022 Chronic systolic (congestive) heart failure: Secondary | ICD-10-CM | POA: Diagnosis not present

## 2017-08-12 DIAGNOSIS — I110 Hypertensive heart disease with heart failure: Secondary | ICD-10-CM | POA: Diagnosis not present

## 2017-08-12 DIAGNOSIS — M1990 Unspecified osteoarthritis, unspecified site: Secondary | ICD-10-CM | POA: Diagnosis not present

## 2017-08-14 DIAGNOSIS — Z471 Aftercare following joint replacement surgery: Secondary | ICD-10-CM | POA: Diagnosis not present

## 2017-08-14 DIAGNOSIS — D696 Thrombocytopenia, unspecified: Secondary | ICD-10-CM | POA: Diagnosis not present

## 2017-08-14 DIAGNOSIS — G4733 Obstructive sleep apnea (adult) (pediatric): Secondary | ICD-10-CM | POA: Diagnosis not present

## 2017-08-14 DIAGNOSIS — I5022 Chronic systolic (congestive) heart failure: Secondary | ICD-10-CM | POA: Diagnosis not present

## 2017-08-14 DIAGNOSIS — E119 Type 2 diabetes mellitus without complications: Secondary | ICD-10-CM | POA: Diagnosis not present

## 2017-08-14 DIAGNOSIS — I110 Hypertensive heart disease with heart failure: Secondary | ICD-10-CM | POA: Diagnosis not present

## 2017-08-14 DIAGNOSIS — I2510 Atherosclerotic heart disease of native coronary artery without angina pectoris: Secondary | ICD-10-CM | POA: Diagnosis not present

## 2017-08-14 DIAGNOSIS — M1990 Unspecified osteoarthritis, unspecified site: Secondary | ICD-10-CM | POA: Diagnosis not present

## 2017-08-14 DIAGNOSIS — I429 Cardiomyopathy, unspecified: Secondary | ICD-10-CM | POA: Diagnosis not present

## 2017-08-16 ENCOUNTER — Other Ambulatory Visit: Payer: Self-pay | Admitting: Interventional Cardiology

## 2017-08-16 DIAGNOSIS — M1990 Unspecified osteoarthritis, unspecified site: Secondary | ICD-10-CM | POA: Diagnosis not present

## 2017-08-16 DIAGNOSIS — I110 Hypertensive heart disease with heart failure: Secondary | ICD-10-CM | POA: Diagnosis not present

## 2017-08-16 DIAGNOSIS — I10 Essential (primary) hypertension: Secondary | ICD-10-CM

## 2017-08-16 DIAGNOSIS — I2510 Atherosclerotic heart disease of native coronary artery without angina pectoris: Secondary | ICD-10-CM

## 2017-08-16 DIAGNOSIS — I429 Cardiomyopathy, unspecified: Secondary | ICD-10-CM | POA: Diagnosis not present

## 2017-08-16 DIAGNOSIS — G4733 Obstructive sleep apnea (adult) (pediatric): Secondary | ICD-10-CM | POA: Diagnosis not present

## 2017-08-16 DIAGNOSIS — I255 Ischemic cardiomyopathy: Secondary | ICD-10-CM

## 2017-08-16 DIAGNOSIS — E119 Type 2 diabetes mellitus without complications: Secondary | ICD-10-CM | POA: Diagnosis not present

## 2017-08-16 DIAGNOSIS — Z471 Aftercare following joint replacement surgery: Secondary | ICD-10-CM | POA: Diagnosis not present

## 2017-08-16 DIAGNOSIS — I5022 Chronic systolic (congestive) heart failure: Secondary | ICD-10-CM | POA: Diagnosis not present

## 2017-08-16 DIAGNOSIS — D696 Thrombocytopenia, unspecified: Secondary | ICD-10-CM | POA: Diagnosis not present

## 2017-08-16 MED ORDER — POTASSIUM CHLORIDE CRYS ER 20 MEQ PO TBCR: 20.0000 meq | EXTENDED_RELEASE_TABLET | Freq: Every day | ORAL | 3 refills | Status: DC

## 2017-08-16 MED ORDER — CARVEDILOL 6.25 MG PO TABS: 6.2500 mg | ORAL_TABLET | Freq: Two times a day (BID) | ORAL | 3 refills | Status: DC

## 2017-08-16 MED ORDER — FUROSEMIDE 40 MG PO TABS: 40.0000 mg | ORAL_TABLET | Freq: Every day | ORAL | 3 refills | Status: DC

## 2017-08-17 DIAGNOSIS — M6281 Muscle weakness (generalized): Secondary | ICD-10-CM | POA: Diagnosis not present

## 2017-08-17 DIAGNOSIS — Z96651 Presence of right artificial knee joint: Secondary | ICD-10-CM | POA: Diagnosis not present

## 2017-08-17 DIAGNOSIS — G4710 Hypersomnia, unspecified: Secondary | ICD-10-CM | POA: Diagnosis not present

## 2017-08-17 DIAGNOSIS — G4733 Obstructive sleep apnea (adult) (pediatric): Secondary | ICD-10-CM | POA: Diagnosis not present

## 2017-08-18 DIAGNOSIS — I2510 Atherosclerotic heart disease of native coronary artery without angina pectoris: Secondary | ICD-10-CM | POA: Diagnosis not present

## 2017-08-18 DIAGNOSIS — M1990 Unspecified osteoarthritis, unspecified site: Secondary | ICD-10-CM | POA: Diagnosis not present

## 2017-08-18 DIAGNOSIS — I5022 Chronic systolic (congestive) heart failure: Secondary | ICD-10-CM | POA: Diagnosis not present

## 2017-08-18 DIAGNOSIS — G4733 Obstructive sleep apnea (adult) (pediatric): Secondary | ICD-10-CM | POA: Diagnosis not present

## 2017-08-18 DIAGNOSIS — E119 Type 2 diabetes mellitus without complications: Secondary | ICD-10-CM | POA: Diagnosis not present

## 2017-08-18 DIAGNOSIS — Z471 Aftercare following joint replacement surgery: Secondary | ICD-10-CM | POA: Diagnosis not present

## 2017-08-18 DIAGNOSIS — I429 Cardiomyopathy, unspecified: Secondary | ICD-10-CM | POA: Diagnosis not present

## 2017-08-18 DIAGNOSIS — I110 Hypertensive heart disease with heart failure: Secondary | ICD-10-CM | POA: Diagnosis not present

## 2017-08-18 DIAGNOSIS — D696 Thrombocytopenia, unspecified: Secondary | ICD-10-CM | POA: Diagnosis not present

## 2017-08-19 DIAGNOSIS — Z96651 Presence of right artificial knee joint: Secondary | ICD-10-CM | POA: Diagnosis not present

## 2017-08-20 DIAGNOSIS — D696 Thrombocytopenia, unspecified: Secondary | ICD-10-CM | POA: Diagnosis not present

## 2017-08-20 DIAGNOSIS — Z471 Aftercare following joint replacement surgery: Secondary | ICD-10-CM | POA: Diagnosis not present

## 2017-08-20 DIAGNOSIS — E119 Type 2 diabetes mellitus without complications: Secondary | ICD-10-CM | POA: Diagnosis not present

## 2017-08-20 DIAGNOSIS — I5022 Chronic systolic (congestive) heart failure: Secondary | ICD-10-CM | POA: Diagnosis not present

## 2017-08-20 DIAGNOSIS — I2510 Atherosclerotic heart disease of native coronary artery without angina pectoris: Secondary | ICD-10-CM | POA: Diagnosis not present

## 2017-08-20 DIAGNOSIS — I429 Cardiomyopathy, unspecified: Secondary | ICD-10-CM | POA: Diagnosis not present

## 2017-08-20 DIAGNOSIS — G4733 Obstructive sleep apnea (adult) (pediatric): Secondary | ICD-10-CM | POA: Diagnosis not present

## 2017-08-20 DIAGNOSIS — I110 Hypertensive heart disease with heart failure: Secondary | ICD-10-CM | POA: Diagnosis not present

## 2017-08-20 DIAGNOSIS — M1990 Unspecified osteoarthritis, unspecified site: Secondary | ICD-10-CM | POA: Diagnosis not present

## 2017-08-22 DIAGNOSIS — E119 Type 2 diabetes mellitus without complications: Secondary | ICD-10-CM | POA: Diagnosis not present

## 2017-08-22 DIAGNOSIS — D696 Thrombocytopenia, unspecified: Secondary | ICD-10-CM | POA: Diagnosis not present

## 2017-08-22 DIAGNOSIS — I5022 Chronic systolic (congestive) heart failure: Secondary | ICD-10-CM | POA: Diagnosis not present

## 2017-08-22 DIAGNOSIS — Z471 Aftercare following joint replacement surgery: Secondary | ICD-10-CM | POA: Diagnosis not present

## 2017-08-22 DIAGNOSIS — G4733 Obstructive sleep apnea (adult) (pediatric): Secondary | ICD-10-CM | POA: Diagnosis not present

## 2017-08-22 DIAGNOSIS — M1990 Unspecified osteoarthritis, unspecified site: Secondary | ICD-10-CM | POA: Diagnosis not present

## 2017-08-22 DIAGNOSIS — I2510 Atherosclerotic heart disease of native coronary artery without angina pectoris: Secondary | ICD-10-CM | POA: Diagnosis not present

## 2017-08-22 DIAGNOSIS — I429 Cardiomyopathy, unspecified: Secondary | ICD-10-CM | POA: Diagnosis not present

## 2017-08-22 DIAGNOSIS — I110 Hypertensive heart disease with heart failure: Secondary | ICD-10-CM | POA: Diagnosis not present

## 2017-08-26 DIAGNOSIS — R262 Difficulty in walking, not elsewhere classified: Secondary | ICD-10-CM | POA: Diagnosis not present

## 2017-08-26 DIAGNOSIS — M1711 Unilateral primary osteoarthritis, right knee: Secondary | ICD-10-CM | POA: Diagnosis not present

## 2017-08-26 DIAGNOSIS — M25561 Pain in right knee: Secondary | ICD-10-CM | POA: Diagnosis not present

## 2017-08-26 DIAGNOSIS — M25661 Stiffness of right knee, not elsewhere classified: Secondary | ICD-10-CM | POA: Diagnosis not present

## 2017-08-29 DIAGNOSIS — M25661 Stiffness of right knee, not elsewhere classified: Secondary | ICD-10-CM | POA: Diagnosis not present

## 2017-08-29 DIAGNOSIS — M25561 Pain in right knee: Secondary | ICD-10-CM | POA: Diagnosis not present

## 2017-08-29 DIAGNOSIS — R262 Difficulty in walking, not elsewhere classified: Secondary | ICD-10-CM | POA: Diagnosis not present

## 2017-08-29 DIAGNOSIS — M1711 Unilateral primary osteoarthritis, right knee: Secondary | ICD-10-CM | POA: Diagnosis not present

## 2017-09-02 NOTE — Progress Notes (Signed)
Cardiology Office Note   Date:  09/03/2017   ID:  Gordon Madden, DOB Jun 22, 1951, MRN [id number]  PCP:  Kelton Pillar, MD    No chief complaint on file. CAD, systolic heart failure   Wt Readings from Last 3 Encounters:  09/03/17 (!) 327 lb 12.8 oz (148.7 kg)  08/06/17 (!) 338 lb (153.3 kg)  08/01/17 (!) 338 lb 1.9 oz (153.4 kg)       History of Present Illness: Gordon Madden is a 66 y.o. male  with history of CAD, Status post MI in 2004 and found to have a totally occluded RCA at [facility] in [place]. Last stress Myoview 05/2014 low risk with large inferior lateral scar and minimal peri-infarct ischemia EF 35% with inferolateral akinesis/mild dyskinesis. Felt to be low risk. 2-D echo 05/2014 EF 40-40%. chronic systolic CHF with EF 16-60%, diabetes mellitus, CVA, gout, HTN, HLD, OSA, BPH, morbid obesity, chronic DOE, chronic thrombocytopenia, arthritis, mildly dilated aorta.  Patient saw Gordon Madden/PA-C 07/10/17 for surgical clearance before undergoing right knee surgery. He was having worsening shortness of breath. 2-D echo shows LVEF 35-40% with global hypokinesis and grade 1 DD-worse than before. Nuclear stress test 07/18/17 LVEF 36% with fixed large severe basal inferior and basal to mid inferior lateral defect but no ischemia.  I reviewed and felt he could proceed with surgery and to avoid excessive IV fluids. 6/18 BNP was normal.  He had right knee surgery and tolerated it well.  He has some right knee pain, but it is improving.  He had no cardiac issues.    Denies : Chest pain. Dizziness. Leg edema. Nitroglycerin use. Orthopnea. Palpitations. Paroxysmal nocturnal dyspnea. Shortness of breath. Syncope.   He continues on the diuretic.  He has lost weight, eating less.      Past Medical History:  Diagnosis Date  . Arthritis   . Ascending aorta dilatation (HCC)   . BPH (benign prostatic hyperplasia)   . CAD (coronary artery disease)    a. MI 2004 with  occ RCA.  . Cardiomyopathy    EF 40%  . Chronic systolic CHF (congestive heart failure) ([place])   . Colon polyps   . CVA (cerebral infarction)    Renal artery branch occlusion  . Diabetes mellitus, type 2 ([place])   . History of gout   . Hyperlipidemia   . Hypertension   . Idiopathic thrombocytopenia ([place])   . Morbid obesity ([place])   . Myocardial infarction ([place])   . OSA (obstructive sleep apnea)    07/25/10 AHI 14.9/hr  . Pneumonia   . PONV (postoperative nausea and vomiting)   . Primary localized osteoarthritis of left knee 06/07/2015  . Primary localized osteoarthritis of right knee 08/06/2017  . Rhinosinusitis   . Shortness of breath dyspnea   . Stroke ([place])   . Vision decreased     Past Surgical History:  Procedure Laterality Date  . CARDIAC CATHETERIZATION  2005   no stents  . CYST EXCISION     chest and back  . HERNIA REPAIR     umbilical  . JOINT REPLACEMENT     Left 06/07/15  . KNEE CARTILAGE SURGERY     left  . KNEE SURGERY Right 08/06/2017  . MENISCUS REPAIR Left   . NASAL SINUS SURGERY    . PARTIAL KNEE ARTHROPLASTY Right 08/06/2017   Procedure: UNICOMPARTMENTAL KNEE;  Surgeon: [name], MD;  Location: [place];  Service: Orthopedics;  Laterality: Right;  .  SINUS IRRIGATION    . TOTAL KNEE ARTHROPLASTY Left 06/07/2015   Procedure: TOTAL KNEE ARTHROPLASTY;  Surgeon: [name], MD;  Location: [place];  Service: Orthopedics;  Laterality: Left;  . UMBILICAL HERNIA REPAIR       Current Outpatient Prescriptions  Medication Sig Dispense Refill  . baclofen (LIORESAL) 10 MG tablet Take 1 tablet (10 mg total) by mouth 3 (three) times daily. As needed for muscle spasm 50 tablet 0  . carvedilol (COREG) 6.25 MG tablet Take 1 tablet (6.25 mg total) by mouth 2 (two) times daily. 180 tablet 3  . Coenzyme Q10 (CO Q 10 PO) Take 1 tablet by mouth daily.    . fexofenadine (ALLEGRA) 180 MG tablet Take 180 mg by mouth daily.    . folic acid (FOLVITE) 656 MCG tablet Take 400 mcg  by mouth daily.    . furosemide (LASIX) 40 MG tablet Take 1 tablet (40 mg total) by mouth daily. 90 tablet 3  . GLUCOSAMINE-CHONDROITIN PO Take 1 tablet by mouth 2 (two) times daily.    . metFORMIN (GLUCOPHAGE) 500 MG tablet Take 1,000 mg by mouth 2 (two) times daily with a meal.     . ondansetron (ZOFRAN) 4 MG tablet Take 1 tablet (4 mg total) by mouth every 8 (eight) hours as needed for nausea or vomiting. 30 tablet 0  . oxyCODONE (ROXICODONE) 5 MG immediate release tablet Take 1-2 tablets (5-10 mg total) by mouth every 4 (four) hours as needed for severe pain. 50 tablet 0  . oxyCODONE-acetaminophen (PERCOCET/ROXICET) 5-325 MG tablet Take 1 tablet by mouth as needed for pain.    . potassium chloride SA (K-DUR,KLOR-CON) 20 MEQ tablet Take 1 tablet (20 mEq total) by mouth daily. 90 tablet 3  . pravastatin (PRAVACHOL) 40 MG tablet Take 40 mg by mouth daily.    . rivaroxaban (XARELTO) 10 MG TABS tablet Take 1 tablet (10 mg total) by mouth daily. 21 tablet 0  . sacubitril-valsartan (ENTRESTO) 49-51 MG Take 1 tablet by mouth 2 (two) times daily. 60 tablet 9  . sennosides-docusate sodium (SENOKOT-S) 8.6-50 MG tablet Take 2 tablets by mouth daily. 30 tablet 1  . vitamin B-12 (CYANOCOBALAMIN) 1000 MCG tablet Take 1,000 mcg by mouth daily.     No current facility-administered medications for this visit.     Allergies:   Plavix [clopidogrel]    Social History:  The patient  reports that he has quit smoking. His smoking use included Cigars and Cigarettes. He has a 60.00 pack-year smoking history. He has never used smokeless tobacco. He reports that he drinks alcohol. He reports that he does not use drugs.   Family History:  The patient's family history includes Hypertension in his father.    ROS:  Please see the history of present illness.   Otherwise, review of systems are positive for right knee pain.   All other systems are reviewed and negative.    PHYSICAL EXAM: VS:  BP 118/86   Pulse 81    Ht 5' 11.5" (1.816 m)   Wt (!) 327 lb 12.8 oz (148.7 kg)   SpO2 96%   BMI 45.08 kg/m  , BMI Body mass index is 45.08 kg/m. GEN: Well nourished, well developed, in no acute distress  HEENT: normal  Neck: no JVD, carotid bruits, or masses Cardiac: RRR; no murmurs, rubs, or gallops,no edema  Respiratory:  clear to auscultation bilaterally, normal work of breathing GI: soft, nontender, nondistended, + BS MS: no deformity or atrophy  Skin: warm and dry, no rash Neuro:  Strength and sensation are intact Psych: euthymic mood, full affect    Recent Labs: 05/17/2017: NT-Pro BNP 235 08/07/2017: BUN 21; Creatinine, Ser 1.08; Hemoglobin 14.0; Platelets 84; Potassium 5.1; Sodium 134   Lipid Panel No results found for: CHOL, TRIG, HDL, CHOLHDL, VLDL, LDLCALC, LDLDIRECT   Other studies Reviewed: Additional studies/ records that were reviewed today with results demonstrating: Stress test and echo results as noted above.   ASSESSMENT AND PLAN:  1. CAD : No angina.  Continue aggressive secondary prevention. 2. Chronic systolic heart failure: He is tolerating Entresto 49/51 twice a day. Will check electrolytes today. He may be able to stop his potassium supplement. If labs are okay, will consider increasing Entresto to the 97/103 dose. 3. Ischemic cadiomyopathy: Lasix has improved to shortness of breath. We'll have to monitor potassium. 4. Essential HTN: Blood pressure has been well controlled. 5. Tobacco abuse: He quit smoking cigars. 6. Mitral regurgitation: No overt CHF symptoms. 7. Hyperlipidemia: Lipids checked in August 2018 and well controlled. 8. DM: managed with PMD.   Current medicines are reviewed at length with the patient today.  The patient concerns regarding his medicines were addressed.  The following changes have been made:  No change  Labs/ tests ordered today include:  No orders of the defined types were placed in this encounter.   Recommend 150 minutes/week of aerobic  exercise Low fat, low carb, high fiber diet recommended  Disposition:   FU in 6 months   Signed, Larae Grooms, MD  09/03/2017 8:31 AM    [facility] [address], [place], [place]  [address] Phone: [phone]; Fax: [phone]

## 2017-09-03 ENCOUNTER — Ambulatory Visit (INDEPENDENT_AMBULATORY_CARE_PROVIDER_SITE_OTHER): Payer: Medicare HMO | Admitting: Interventional Cardiology

## 2017-09-03 ENCOUNTER — Encounter: Payer: Self-pay | Admitting: Interventional Cardiology

## 2017-09-03 ENCOUNTER — Other Ambulatory Visit: Payer: Self-pay

## 2017-09-03 VITALS — BP 118/86 | HR 81 | Ht 71.5 in | Wt 327.8 lb

## 2017-09-03 DIAGNOSIS — I340 Nonrheumatic mitral (valve) insufficiency: Secondary | ICD-10-CM | POA: Diagnosis not present

## 2017-09-03 DIAGNOSIS — F17200 Nicotine dependence, unspecified, uncomplicated: Secondary | ICD-10-CM | POA: Diagnosis not present

## 2017-09-03 DIAGNOSIS — I10 Essential (primary) hypertension: Secondary | ICD-10-CM

## 2017-09-03 DIAGNOSIS — I2510 Atherosclerotic heart disease of native coronary artery without angina pectoris: Secondary | ICD-10-CM

## 2017-09-03 DIAGNOSIS — E1159 Type 2 diabetes mellitus with other circulatory complications: Secondary | ICD-10-CM

## 2017-09-03 DIAGNOSIS — M25561 Pain in right knee: Secondary | ICD-10-CM | POA: Diagnosis not present

## 2017-09-03 DIAGNOSIS — I5022 Chronic systolic (congestive) heart failure: Secondary | ICD-10-CM | POA: Diagnosis not present

## 2017-09-03 DIAGNOSIS — M25661 Stiffness of right knee, not elsewhere classified: Secondary | ICD-10-CM | POA: Diagnosis not present

## 2017-09-03 DIAGNOSIS — M1711 Unilateral primary osteoarthritis, right knee: Secondary | ICD-10-CM | POA: Diagnosis not present

## 2017-09-03 DIAGNOSIS — R262 Difficulty in walking, not elsewhere classified: Secondary | ICD-10-CM | POA: Diagnosis not present

## 2017-09-03 LAB — BASIC METABOLIC PANEL
BUN/Creatinine Ratio: 24 (ref 10–24)
BUN: 19 mg/dL (ref 8–27)
CHLORIDE: 98 mmol/L (ref 96–106)
CO2: 24 mmol/L (ref 20–29)
Calcium: 9.4 mg/dL (ref 8.6–10.2)
Creatinine, Ser: 0.78 mg/dL (ref 0.76–1.27)
GFR calc Af Amer: 107 mL/min/{1.73_m2} (ref >59)
GFR calc non Af Amer: 93 mL/min/{1.73_m2} (ref >59)
GLUCOSE: 152 mg/dL — AB (ref 65–99)
POTASSIUM: 5 mmol/L (ref 3.5–5.2)
SODIUM: 139 mmol/L (ref 134–144)

## 2017-09-03 MED ORDER — SACUBITRIL-VALSARTAN 97-103 MG PO TABS: 1.0000 | ORAL_TABLET | Freq: Two times a day (BID) | ORAL | 3 refills | Status: DC

## 2017-09-03 NOTE — Patient Instructions (Addendum)
Medication Instructions:  Your physician recommends that you continue on your current medications as directed. Please refer to the Current Medication list given to you today.   Labwork: BMET TODAY  Testing/Procedures: None ordered  Follow-Up: Your physician wants you to follow-up in: 6 months with Dr. Irish Lack. You will receive a reminder letter in the mail two months in advance. If you don't receive a letter, please call our office to schedule the follow-up appointment.   Any Other Special Instructions Will Be Listed Below (If Applicable).     If you need a refill on your cardiac medications before your next appointment, please call your pharmacy.     Sacubitril; Valsartan oral tablet What is this medicine? SACUBITRIL; VALSARTAN (sak UE bi tril; val SAR tan) is a combination of 2 drugs used to reduce the risk of death and hospitalizations in people with long-lasting heart failure. It is usually used with other medicines to treat heart failure. This medicine may be used for other purposes; ask your health care provider or pharmacist if you have questions. COMMON BRAND NAME(S): Entresto What should I tell my health care provider before I take this medicine? They need to know if you have any of these conditions: -diabetes and take a medicine that contains aliskiren -kidney disease -liver disease -an unusual or allergic reaction to sacubitril; valsartan, drugs called angiotensin converting enzyme (ACE) inhibitors, angiotensin II receptor blockers (ARBs), other medicines, foods, dyes, or preservatives -pregnant or trying to get pregnant -breast-feeding How should I use this medicine? Take this medicine by mouth with a glass of water. Follow the directions on the prescription label. You can take it with or without food. If it upsets your stomach, take it with food. Take your medicine at regular intervals. Do not take it more often than directed. Do not stop taking except on your  doctor's advice. Do not take this medicine for at least 36 hours before or after you take an ACE inhibitor medicine. Talk to your health care provider if you are not sure if you take an ACE inhibitor. Talk to your pediatrician regarding the use of this medicine in children. Special care may be needed. Overdosage: If you think you have taken too much of this medicine contact a poison control center or emergency room at once. NOTE: This medicine is only for you. Do not share this medicine with others. What if I miss a dose? If you miss a dose, take it as soon as you can. If it is almost time for next dose, take only that dose. Do not take double or extra doses. What may interact with this medicine? Do not take this medicine with any of the following medicines: -aliskiren if you have diabetes -angiotensin-converting enzyme (ACE) inhibitors, like benazepril, captopril, enalapril, fosinopril, lisinopril, or ramipril This medicine may also interact with the following medicines: -angiotensin II receptor blockers (ARBs) like azilsartan, candesartan, eprosartan, irbesartan, losartan, olmesartan, telmisartan, or valsartan -lithium -NSAIDS, medicines for pain and inflammation, like ibuprofen or naproxen -potassium-sparing diuretics like amiloride, spironolactone, and triamterene -potassium supplements This list may not describe all possible interactions. Give your health care provider a list of all the medicines, herbs, non-prescription drugs, or dietary supplements you use. Also tell them if you smoke, drink alcohol, or use illegal drugs. Some items may interact with your medicine. What should I watch for while using this medicine? Tell your doctor or healthcare professional if your symptoms do not start to get better or if they get worse. Do not  become pregnant while taking this medicine. Women should inform their doctor if they wish to become pregnant or think they might be pregnant. There is a potential  for serious side effects to an unborn child. Talk to your health care professional or pharmacist for more information. You may get dizzy. Do not drive, use machinery, or do anything that needs mental alertness until you know how this medicine affects you. Do not stand or sit up quickly, especially if you are an older patient. This reduces the risk of dizzy or fainting spells. Avoid alcoholic drinks; they can make you more dizzy. What side effects may I notice from receiving this medicine? Side effects that you should report to your doctor or health care professional as soon as possible: -allergic reactions like skin rash, itching or hives, swelling of the face, lips, or tongue -signs and symptoms of increased potassium like muscle weakness; chest pain; or fast, irregular heartbeat -signs and symptoms of kidney injury like trouble passing urine or change in the amount of urine -signs and symptoms of low blood pressure like feeling dizzy or lightheaded, or if you develop extreme fatigue Side effects that usually do not require medical attention (report to your doctor or health care professional if they continue or are bothersome): -cough This list may not describe all possible side effects. Call your doctor for medical advice about side effects. You may report side effects to FDA at 1-800-FDA-1088. Where should I keep my medicine? Keep out of the reach of children. Store at room temperature between 15 and 30 degrees C (59 and 86 degrees F). Throw away any unused medicine after the expiration date. NOTE: This sheet is a summary. It may not cover all possible information. If you have questions about this medicine, talk to your doctor, pharmacist, or health care provider.  2018 Elsevier/Gold Standard (2015-12-28 13:54:19)

## 2017-09-05 DIAGNOSIS — M25561 Pain in right knee: Secondary | ICD-10-CM | POA: Diagnosis not present

## 2017-09-05 DIAGNOSIS — M25661 Stiffness of right knee, not elsewhere classified: Secondary | ICD-10-CM | POA: Diagnosis not present

## 2017-09-05 DIAGNOSIS — R262 Difficulty in walking, not elsewhere classified: Secondary | ICD-10-CM | POA: Diagnosis not present

## 2017-09-05 DIAGNOSIS — M1711 Unilateral primary osteoarthritis, right knee: Secondary | ICD-10-CM | POA: Diagnosis not present

## 2017-09-09 ENCOUNTER — Telehealth: Payer: Self-pay

## 2017-09-09 NOTE — Telephone Encounter (Signed)
**Note De-Identified Fayelynn Distel Obfuscation** I have done a PA on Entresto through covermymeds

## 2017-09-10 DIAGNOSIS — M25661 Stiffness of right knee, not elsewhere classified: Secondary | ICD-10-CM | POA: Diagnosis not present

## 2017-09-10 DIAGNOSIS — M25561 Pain in right knee: Secondary | ICD-10-CM | POA: Diagnosis not present

## 2017-09-10 DIAGNOSIS — M1711 Unilateral primary osteoarthritis, right knee: Secondary | ICD-10-CM | POA: Diagnosis not present

## 2017-09-10 DIAGNOSIS — R262 Difficulty in walking, not elsewhere classified: Secondary | ICD-10-CM | POA: Diagnosis not present

## 2017-09-11 ENCOUNTER — Telehealth: Payer: Self-pay

## 2017-09-11 NOTE — Telephone Encounter (Signed)
Entresto approved by [employer] Valid until 09/10/2019.

## 2017-09-12 DIAGNOSIS — M25661 Stiffness of right knee, not elsewhere classified: Secondary | ICD-10-CM | POA: Diagnosis not present

## 2017-09-12 DIAGNOSIS — M25561 Pain in right knee: Secondary | ICD-10-CM | POA: Diagnosis not present

## 2017-09-12 DIAGNOSIS — R262 Difficulty in walking, not elsewhere classified: Secondary | ICD-10-CM | POA: Diagnosis not present

## 2017-09-12 DIAGNOSIS — M1711 Unilateral primary osteoarthritis, right knee: Secondary | ICD-10-CM | POA: Diagnosis not present

## 2017-09-16 DIAGNOSIS — G4733 Obstructive sleep apnea (adult) (pediatric): Secondary | ICD-10-CM | POA: Diagnosis not present

## 2017-09-16 DIAGNOSIS — M1711 Unilateral primary osteoarthritis, right knee: Secondary | ICD-10-CM | POA: Diagnosis not present

## 2017-09-16 DIAGNOSIS — Z96651 Presence of right artificial knee joint: Secondary | ICD-10-CM | POA: Diagnosis not present

## 2017-09-16 DIAGNOSIS — M6281 Muscle weakness (generalized): Secondary | ICD-10-CM | POA: Diagnosis not present

## 2017-09-16 DIAGNOSIS — G4710 Hypersomnia, unspecified: Secondary | ICD-10-CM | POA: Diagnosis not present

## 2017-09-17 DIAGNOSIS — M1711 Unilateral primary osteoarthritis, right knee: Secondary | ICD-10-CM | POA: Diagnosis not present

## 2017-09-17 DIAGNOSIS — R262 Difficulty in walking, not elsewhere classified: Secondary | ICD-10-CM | POA: Diagnosis not present

## 2017-09-17 DIAGNOSIS — M25661 Stiffness of right knee, not elsewhere classified: Secondary | ICD-10-CM | POA: Diagnosis not present

## 2017-09-17 DIAGNOSIS — M25561 Pain in right knee: Secondary | ICD-10-CM | POA: Diagnosis not present

## 2017-09-19 DIAGNOSIS — M25561 Pain in right knee: Secondary | ICD-10-CM | POA: Diagnosis not present

## 2017-09-19 DIAGNOSIS — M25661 Stiffness of right knee, not elsewhere classified: Secondary | ICD-10-CM | POA: Diagnosis not present

## 2017-09-19 DIAGNOSIS — M1711 Unilateral primary osteoarthritis, right knee: Secondary | ICD-10-CM | POA: Diagnosis not present

## 2017-09-19 DIAGNOSIS — R262 Difficulty in walking, not elsewhere classified: Secondary | ICD-10-CM | POA: Diagnosis not present

## 2017-09-25 ENCOUNTER — Telehealth: Payer: Self-pay

## 2017-09-25 DIAGNOSIS — I10 Essential (primary) hypertension: Secondary | ICD-10-CM

## 2017-09-25 NOTE — Telephone Encounter (Signed)
Notes recorded by Drue Novel I, RN on 09/25/2017 at 9:07 AM EDT Called patient and he stated that he started the higher dose of entresto 97/103 BID last Friday. Scheduled lab appointment for 10/02/17 per patient's request. ------  Notes recorded by Drue Novel I, RN on 09/03/2017 at 5:45 PM EDT Patient made aware of results and recommendations to stop potassium and increase entresto to 97/103 BID. Patient received 2 weeks worth of samples for entresto 49/51 today in the office. Patient will complete samples and will start entresto 97/103 BID after samples completed. Rx sent to patient's preferred pharmacy. Will arrange BMET 7-10 days after increased dose. Patient requesting CBC be checked at that time as well. Dr. Irish Lack in agreement. ------  Notes recorded by Jettie Booze, MD on 09/03/2017 at 5:17 PM EDT Potassium at upper limit of normal so will increase entresto to 97/103 BID, and stop potassium supplement. BMet in 7-10 days.

## 2017-10-02 ENCOUNTER — Other Ambulatory Visit (INDEPENDENT_AMBULATORY_CARE_PROVIDER_SITE_OTHER): Payer: Medicare HMO

## 2017-10-02 DIAGNOSIS — I10 Essential (primary) hypertension: Secondary | ICD-10-CM | POA: Diagnosis not present

## 2017-10-03 LAB — CBC
HEMATOCRIT: 44.7 % (ref 37.5–51.0)
Hemoglobin: 15.2 g/dL (ref 13.0–17.7)
MCH: 32.3 pg (ref 26.6–33.0)
MCHC: 34 g/dL (ref 31.5–35.7)
MCV: 95 fL (ref 79–97)
Platelets: 114 10*3/uL — ABNORMAL LOW (ref 150–379)
RBC: 4.7 x10E6/uL (ref 4.14–5.80)
RDW: 13.8 % (ref 12.3–15.4)
WBC: 5.6 10*3/uL (ref 3.4–10.8)

## 2017-10-03 LAB — BASIC METABOLIC PANEL
BUN / CREAT RATIO: 23 (ref 10–24)
BUN: 17 mg/dL (ref 8–27)
CO2: 24 mmol/L (ref 20–29)
CREATININE: 0.74 mg/dL — AB (ref 0.76–1.27)
Calcium: 9.3 mg/dL (ref 8.6–10.2)
Chloride: 101 mmol/L (ref 96–106)
GFR calc Af Amer: 110 mL/min/{1.73_m2} (ref >59)
GFR calc non Af Amer: 95 mL/min/{1.73_m2} (ref >59)
GLUCOSE: 155 mg/dL — AB (ref 65–99)
Potassium: 4.7 mmol/L (ref 3.5–5.2)
SODIUM: 138 mmol/L (ref 134–144)

## 2017-10-14 DIAGNOSIS — M1711 Unilateral primary osteoarthritis, right knee: Secondary | ICD-10-CM | POA: Diagnosis not present

## 2017-10-17 DIAGNOSIS — G4710 Hypersomnia, unspecified: Secondary | ICD-10-CM | POA: Diagnosis not present

## 2017-10-17 DIAGNOSIS — G4733 Obstructive sleep apnea (adult) (pediatric): Secondary | ICD-10-CM | POA: Diagnosis not present

## 2017-10-17 DIAGNOSIS — M6281 Muscle weakness (generalized): Secondary | ICD-10-CM | POA: Diagnosis not present

## 2017-10-17 DIAGNOSIS — Z96651 Presence of right artificial knee joint: Secondary | ICD-10-CM | POA: Diagnosis not present

## 2017-10-24 DIAGNOSIS — H02834 Dermatochalasis of left upper eyelid: Secondary | ICD-10-CM | POA: Diagnosis not present

## 2017-10-24 DIAGNOSIS — E119 Type 2 diabetes mellitus without complications: Secondary | ICD-10-CM | POA: Diagnosis not present

## 2017-10-24 DIAGNOSIS — H34231 Retinal artery branch occlusion, right eye: Secondary | ICD-10-CM | POA: Diagnosis not present

## 2017-10-24 DIAGNOSIS — H0289 Other specified disorders of eyelid: Secondary | ICD-10-CM | POA: Diagnosis not present

## 2017-10-24 DIAGNOSIS — H04123 Dry eye syndrome of bilateral lacrimal glands: Secondary | ICD-10-CM | POA: Diagnosis not present

## 2017-10-24 DIAGNOSIS — G4730 Sleep apnea, unspecified: Secondary | ICD-10-CM | POA: Diagnosis not present

## 2017-10-24 DIAGNOSIS — H02831 Dermatochalasis of right upper eyelid: Secondary | ICD-10-CM | POA: Diagnosis not present

## 2017-10-24 DIAGNOSIS — H2513 Age-related nuclear cataract, bilateral: Secondary | ICD-10-CM | POA: Diagnosis not present

## 2017-10-24 DIAGNOSIS — H40013 Open angle with borderline findings, low risk, bilateral: Secondary | ICD-10-CM | POA: Diagnosis not present

## 2017-11-16 DIAGNOSIS — G4733 Obstructive sleep apnea (adult) (pediatric): Secondary | ICD-10-CM | POA: Diagnosis not present

## 2017-11-16 DIAGNOSIS — G4710 Hypersomnia, unspecified: Secondary | ICD-10-CM | POA: Diagnosis not present

## 2017-11-16 DIAGNOSIS — M6281 Muscle weakness (generalized): Secondary | ICD-10-CM | POA: Diagnosis not present

## 2017-11-16 DIAGNOSIS — Z96651 Presence of right artificial knee joint: Secondary | ICD-10-CM | POA: Diagnosis not present

## 2017-12-17 DIAGNOSIS — M6281 Muscle weakness (generalized): Secondary | ICD-10-CM | POA: Diagnosis not present

## 2017-12-17 DIAGNOSIS — Z96651 Presence of right artificial knee joint: Secondary | ICD-10-CM | POA: Diagnosis not present

## 2017-12-17 DIAGNOSIS — G4710 Hypersomnia, unspecified: Secondary | ICD-10-CM | POA: Diagnosis not present

## 2017-12-17 DIAGNOSIS — G4733 Obstructive sleep apnea (adult) (pediatric): Secondary | ICD-10-CM | POA: Diagnosis not present

## 2017-12-25 DIAGNOSIS — R040 Epistaxis: Secondary | ICD-10-CM | POA: Diagnosis not present

## 2017-12-25 DIAGNOSIS — D696 Thrombocytopenia, unspecified: Secondary | ICD-10-CM | POA: Diagnosis not present

## 2017-12-25 DIAGNOSIS — J069 Acute upper respiratory infection, unspecified: Secondary | ICD-10-CM | POA: Diagnosis not present

## 2017-12-26 ENCOUNTER — Encounter: Payer: Self-pay | Admitting: Interventional Cardiology

## 2017-12-26 NOTE — Telephone Encounter (Signed)
Called and spoke to patient regarding his MyChart message. Patient is concerned about the cost of his Entresto. Patient is suppose to be taking Entresto 97-103 BID. Patient asking if he can take it just once a day. I explained to the patient that this is not a daily medication and that it should be taken BID in order for the medication to do its job. Made patient aware that he received PA for the lower dose Entresto valid through 09/10/2019. Made pateint aware that I would forward the information to PA nurse to see if this covers the higher dose or if they would need to submit another request. Patient agrees to continue Entresto 97-103 BID for now and states that he has enough supply. Patient states that he will wait to hear back about PA.

## 2018-01-01 DIAGNOSIS — R05 Cough: Secondary | ICD-10-CM | POA: Diagnosis not present

## 2018-01-01 DIAGNOSIS — J069 Acute upper respiratory infection, unspecified: Secondary | ICD-10-CM | POA: Diagnosis not present

## 2018-01-09 DIAGNOSIS — D696 Thrombocytopenia, unspecified: Secondary | ICD-10-CM | POA: Diagnosis not present

## 2018-01-09 DIAGNOSIS — E782 Mixed hyperlipidemia: Secondary | ICD-10-CM | POA: Diagnosis not present

## 2018-01-09 DIAGNOSIS — I429 Cardiomyopathy, unspecified: Secondary | ICD-10-CM | POA: Diagnosis not present

## 2018-01-09 DIAGNOSIS — E1121 Type 2 diabetes mellitus with diabetic nephropathy: Secondary | ICD-10-CM | POA: Diagnosis not present

## 2018-01-09 DIAGNOSIS — I119 Hypertensive heart disease without heart failure: Secondary | ICD-10-CM | POA: Diagnosis not present

## 2018-01-09 DIAGNOSIS — N181 Chronic kidney disease, stage 1: Secondary | ICD-10-CM | POA: Diagnosis not present

## 2018-01-17 DIAGNOSIS — G4733 Obstructive sleep apnea (adult) (pediatric): Secondary | ICD-10-CM | POA: Diagnosis not present

## 2018-01-17 DIAGNOSIS — Z96651 Presence of right artificial knee joint: Secondary | ICD-10-CM | POA: Diagnosis not present

## 2018-01-17 DIAGNOSIS — M6281 Muscle weakness (generalized): Secondary | ICD-10-CM | POA: Diagnosis not present

## 2018-01-17 DIAGNOSIS — G4710 Hypersomnia, unspecified: Secondary | ICD-10-CM | POA: Diagnosis not present

## 2018-02-14 DIAGNOSIS — M6281 Muscle weakness (generalized): Secondary | ICD-10-CM | POA: Diagnosis not present

## 2018-02-14 DIAGNOSIS — G4710 Hypersomnia, unspecified: Secondary | ICD-10-CM | POA: Diagnosis not present

## 2018-02-14 DIAGNOSIS — G4733 Obstructive sleep apnea (adult) (pediatric): Secondary | ICD-10-CM | POA: Diagnosis not present

## 2018-02-14 DIAGNOSIS — Z96651 Presence of right artificial knee joint: Secondary | ICD-10-CM | POA: Diagnosis not present

## 2018-03-24 ENCOUNTER — Ambulatory Visit: Payer: Medicare HMO | Admitting: Interventional Cardiology

## 2018-03-24 ENCOUNTER — Encounter: Payer: Self-pay | Admitting: Interventional Cardiology

## 2018-03-24 VITALS — BP 134/74 | HR 64 | Ht 71.0 in | Wt 344.0 lb

## 2018-03-24 DIAGNOSIS — I10 Essential (primary) hypertension: Secondary | ICD-10-CM | POA: Diagnosis not present

## 2018-03-24 DIAGNOSIS — I340 Nonrheumatic mitral (valve) insufficiency: Secondary | ICD-10-CM | POA: Diagnosis not present

## 2018-03-24 DIAGNOSIS — I25118 Atherosclerotic heart disease of native coronary artery with other forms of angina pectoris: Secondary | ICD-10-CM | POA: Diagnosis not present

## 2018-03-24 DIAGNOSIS — E782 Mixed hyperlipidemia: Secondary | ICD-10-CM

## 2018-03-24 DIAGNOSIS — E1159 Type 2 diabetes mellitus with other circulatory complications: Secondary | ICD-10-CM

## 2018-03-24 NOTE — Patient Instructions (Signed)

## 2018-03-24 NOTE — Progress Notes (Signed)
Cardiology Office Note   Date:  03/24/2018   ID:  Gordon Madden, DOB Jul 07, 1951, MRN [id number]  PCP:  Kelton Pillar, MD    No chief complaint on file.  CAD  Wt Readings from Last 3 Encounters:  03/24/18 (!) 344 lb (156 kg)  09/03/17 (!) 327 lb 12.8 oz (148.7 kg)  08/06/17 (!) 338 lb (153.3 kg)       History of Present Illness: Gordon Madden is a 67 y.o. male  with history of CAD, Status post MI in 2004 and found to have a totally occluded RCA at [facility] in [place]. Last stress Myoview 05/2014 low risk with large inferior lateral scar and minimal peri-infarct ischemia EF 35% with inferolateral akinesis/mild dyskinesis. Felt to be low risk. 2-D echo 05/2014 EF 40-40%.chronic systolic CHF with EF 66-29%, diabetes mellitus, CVA, gout, HTN, HLD, OSA, BPH, morbid obesity, chronic DOE, chronic thrombocytopenia, arthritis, mildly dilated aorta.  Patient saw [name]/PA-C 07/10/17 for surgical clearance before undergoing right knee surgery. He was having worsening shortness of breath. 2-D echo shows LVEF 35-40% with global hypokinesis and grade 1 DD-worse than before. Nuclear stress test 07/18/17 LVEF 36% with fixed large severe basal inferior and basal to mid inferior lateral defect but no ischemia.  I reviewed and felt he could proceed with surgery and to avoid excessive IV fluids. 6/18 BNP was normal.  He had right knee surgery and tolerated it well, in 9/18.   Denies : Chest pain. Dizziness. Leg edema. Nitroglycerin use. Orthopnea. Palpitations. Paroxysmal nocturnal dyspnea. Shortness of breath. Syncope.   He has been walking some. Hopefully, this will improve.  He gained some weight since his last visit.  Nosebleed in 2/19 led to aspirin being stopped.  He continues to take Plavix.    Past Medical History:  Diagnosis Date  . Arthritis   . Ascending aorta dilatation (HCC)   . BPH (benign prostatic hyperplasia)   . CAD (coronary artery disease)    a.  MI 2004 with occ RCA.  . Cardiomyopathy    EF 40%  . Chronic systolic CHF (congestive heart failure) ([place])   . Colon polyps   . CVA (cerebral infarction)    Renal artery branch occlusion  . Diabetes mellitus, type 2 ([place])   . History of gout   . Hyperlipidemia   . Hypertension   . Idiopathic thrombocytopenia ([place])   . Morbid obesity ([place])   . Myocardial infarction ([place])   . OSA (obstructive sleep apnea)    07/25/10 AHI 14.9/hr  . Pneumonia   . PONV (postoperative nausea and vomiting)   . Primary localized osteoarthritis of left knee 06/07/2015  . Primary localized osteoarthritis of right knee 08/06/2017  . Rhinosinusitis   . Shortness of breath dyspnea   . Stroke ([place])   . Vision decreased     Past Surgical History:  Procedure Laterality Date  . CARDIAC CATHETERIZATION  2005   no stents  . CYST EXCISION     chest and back  . HERNIA REPAIR     umbilical  . JOINT REPLACEMENT     Left 06/07/15  . KNEE CARTILAGE SURGERY     left  . KNEE SURGERY Right 08/06/2017  . MENISCUS REPAIR Left   . NASAL SINUS SURGERY    . PARTIAL KNEE ARTHROPLASTY Right 08/06/2017   Procedure: UNICOMPARTMENTAL KNEE;  Surgeon: [name], MD;  Location: [place];  Service: Orthopedics;  Laterality: Right;  . SINUS IRRIGATION    .  TOTAL KNEE ARTHROPLASTY Left 06/07/2015   Procedure: TOTAL KNEE ARTHROPLASTY;  Surgeon: [name], MD;  Location: [place];  Service: Orthopedics;  Laterality: Left;  . UMBILICAL HERNIA REPAIR       Current Outpatient Medications  Medication Sig Dispense Refill  . carvedilol (COREG) 6.25 MG tablet Take 1 tablet (6.25 mg total) by mouth 2 (two) times daily. 180 tablet 3  . clopidogrel (PLAVIX) 75 MG tablet Take 75 mg by mouth daily.    . Coenzyme Q10 (CO Q 10 PO) Take 1 tablet by mouth daily.    . fexofenadine (ALLEGRA) 180 MG tablet Take 180 mg by mouth daily.    . folic acid (FOLVITE) 536 MCG tablet Take 400 mcg by mouth daily.    . furosemide (LASIX) 40 MG tablet  Take 40 mg by mouth daily.    [name] GLUCOSAMINE-CHONDROITIN PO Take 1 tablet by mouth 2 (two) times daily.    . metFORMIN (GLUCOPHAGE) 500 MG tablet Take 1,000 mg by mouth 2 (two) times daily with a meal.     . pravastatin (PRAVACHOL) 40 MG tablet Take 40 mg by mouth daily.    . sacubitril-valsartan (ENTRESTO) 97-103 MG Take 1 tablet by mouth 2 (two) times daily. 180 tablet 3  . vitamin B-12 (CYANOCOBALAMIN) 1000 MCG tablet Take 1,000 mcg by mouth daily.     No current facility-administered medications for this visit.     Allergies:   Plavix [clopidogrel]    Social History:  The patient  reports that he has quit smoking. His smoking use included cigars and cigarettes. He has a 60.00 pack-year smoking history. He has never used smokeless tobacco. He reports that he drinks alcohol. He reports that he does not use drugs.   Family History:  The patient's family history includes Hypertension in his father.    ROS:  Please see the history of present illness.   Otherwise, review of systems are positive for weight gain.   All other systems are reviewed and negative.    PHYSICAL EXAM: VS:  BP 134/74   Pulse 64   Ht 5\' 11"  (1.803 m)   Wt (!) 344 lb (156 kg)   HC 0.5" (1.3 cm)   SpO2 95%   BMI 47.98 kg/m  , BMI Body mass index is 47.98 kg/m. GEN: Well nourished, well developed, in no acute distress  HEENT: normal  Neck: no JVD, carotid bruits, or masses Cardiac: RRR; no murmurs, rubs, or gallops,no edema  Respiratory:  clear to auscultation bilaterally, normal work of breathing GI: soft, nontender, nondistended, + BS MS: no deformity or atrophy  Skin: warm and dry, no rash Neuro:  Strength and sensation are intact Psych: euthymic mood, full affect    Recent Labs: 05/17/2017: NT-Pro BNP 235 10/02/2017: BUN 17; Creatinine, Ser 0.74; Hemoglobin 15.2; Platelets 114; Potassium 4.7; Sodium 138   Lipid Panel No results found for: CHOL, TRIG, HDL, CHOLHDL, VLDL, LDLCALC, LDLDIRECT   Other  studies Reviewed: Additional studies/ records that were reviewed today with results demonstrating: EF 35-40% in 8/18.  Normal size aortic root.   ASSESSMENT AND PLAN:  1. CAD: No angina on medical therapy.  COntinue curent meds.  2. Chronic systolic heart failure: Taking Entresto which is quite expensive for him.  No CHF sx.   3. Ischemic cardiomyopathy: EF 35-40%.  We spoke about weight loss measures noted below. 4. HTN: COntrolled.  5. Tobacco abuse: Nearly stopped completely.  He smokes on special occasions, such as when on vacation.  6. Mitral regurgitation: Mild regurgitation. No CHF sx. 7. Hyperlipidemia: LDL 75. COntinue pravastatin. 8. DM: A1C 6.7 in 2/19. Well controlled.    Current medicines are reviewed at length with the patient today.  The patient concerns regarding his medicines were addressed.  The following changes have been made:  No change  Labs/ tests ordered today include:  No orders of the defined types were placed in this encounter.   Recommend 150 minutes/week of aerobic exercise Low fat, low carb, high fiber diet recommended  Disposition:   FU in 6 months   Signed, Larae Grooms, MD  03/24/2018 11:13 AM    [facility] [address], [place], [place]  [address] Phone: [phone]; Fax: [phone]

## 2018-03-31 DIAGNOSIS — D696 Thrombocytopenia, unspecified: Secondary | ICD-10-CM | POA: Diagnosis not present

## 2018-03-31 DIAGNOSIS — K625 Hemorrhage of anus and rectum: Secondary | ICD-10-CM | POA: Diagnosis not present

## 2018-03-31 DIAGNOSIS — K649 Unspecified hemorrhoids: Secondary | ICD-10-CM | POA: Diagnosis not present

## 2018-04-09 DIAGNOSIS — G4710 Hypersomnia, unspecified: Secondary | ICD-10-CM | POA: Diagnosis not present

## 2018-04-09 DIAGNOSIS — G4733 Obstructive sleep apnea (adult) (pediatric): Secondary | ICD-10-CM | POA: Diagnosis not present

## 2018-06-09 ENCOUNTER — Encounter: Payer: Self-pay | Admitting: Podiatry

## 2018-06-09 ENCOUNTER — Ambulatory Visit: Payer: Medicare HMO | Admitting: Podiatry

## 2018-06-09 VITALS — BP 156/99 | HR 79 | Resp 16

## 2018-06-09 DIAGNOSIS — L600 Ingrowing nail: Secondary | ICD-10-CM | POA: Diagnosis not present

## 2018-06-09 NOTE — Progress Notes (Signed)
   Subjective:    Patient ID: Gordon Madden, male    DOB: [birth year], 67 y.o.   MRN: [id number]  HPI    Review of Systems  All other systems reviewed and are negative.      Objective:   Physical Exam        Assessment & Plan:

## 2018-06-09 NOTE — Patient Instructions (Signed)

## 2018-06-11 NOTE — Progress Notes (Signed)
Subjective:   Patient ID: Gordon Madden, male   DOB: 67 y.o.   MRN: [id number]   HPI Patient presents stating the ingrown toenail has been present for about 2 weeks and his sugars been running very well with his A1c in the sixes and the nail is painful and he cannot get it out himself.  States is been present a little bit longer than that but worse over the last 2 weeks   Review of Systems  All other systems reviewed and are negative.       Objective:  Physical Exam  Constitutional: He appears well-developed and well-nourished.  Cardiovascular: Intact distal pulses.  Pulmonary/Chest: Effort normal.  Musculoskeletal: Normal range of motion.  Neurological: He is alert.  Skin: Skin is warm.  Nursing note and vitals reviewed.   Neurovascular status intact muscle strength is adequate range of motion within normal limits with patient found to have incurvation of the lateral border of the right hallux with pain with no redness or drainage noted.  Abnormal structure of the nailbed is noted     Assessment:  Ingrown toenail deformity right hallux lateral border with pain     Plan:  H&P condition reviewed and recommended correction.  I explained procedure and risk and today I infiltrated the right hallux 60 mg like Marcaine mixture and under sterile conditions remove the lateral border with sterile instrumentation.  I then expose the matrix and applied phenol 3 applications 30 seconds followed by alcohol lavage sterile dressing and gave instructions on soaks reappoint and encouraged to call with any concerns or questions he may have signed visit

## 2018-08-12 DIAGNOSIS — D696 Thrombocytopenia, unspecified: Secondary | ICD-10-CM | POA: Diagnosis not present

## 2018-08-12 DIAGNOSIS — Z23 Encounter for immunization: Secondary | ICD-10-CM | POA: Diagnosis not present

## 2018-08-12 DIAGNOSIS — I429 Cardiomyopathy, unspecified: Secondary | ICD-10-CM | POA: Diagnosis not present

## 2018-08-12 DIAGNOSIS — Z125 Encounter for screening for malignant neoplasm of prostate: Secondary | ICD-10-CM | POA: Diagnosis not present

## 2018-08-12 DIAGNOSIS — Z1389 Encounter for screening for other disorder: Secondary | ICD-10-CM | POA: Diagnosis not present

## 2018-08-12 DIAGNOSIS — G4733 Obstructive sleep apnea (adult) (pediatric): Secondary | ICD-10-CM | POA: Diagnosis not present

## 2018-08-12 DIAGNOSIS — E1121 Type 2 diabetes mellitus with diabetic nephropathy: Secondary | ICD-10-CM | POA: Diagnosis not present

## 2018-08-12 DIAGNOSIS — E782 Mixed hyperlipidemia: Secondary | ICD-10-CM | POA: Diagnosis not present

## 2018-08-12 DIAGNOSIS — Z0000 Encounter for general adult medical examination without abnormal findings: Secondary | ICD-10-CM | POA: Diagnosis not present

## 2018-08-12 DIAGNOSIS — I119 Hypertensive heart disease without heart failure: Secondary | ICD-10-CM | POA: Diagnosis not present

## 2018-09-05 ENCOUNTER — Encounter: Payer: Self-pay | Admitting: Podiatry

## 2018-09-05 ENCOUNTER — Ambulatory Visit: Payer: Medicare HMO | Admitting: Podiatry

## 2018-09-05 DIAGNOSIS — L600 Ingrowing nail: Secondary | ICD-10-CM

## 2018-09-05 MED ORDER — NEOMYCIN-POLYMYXIN-HC 3.5-10000-1 OT SOLN: OTIC | 0 refills | Status: DC

## 2018-09-05 NOTE — Patient Instructions (Signed)

## 2018-09-05 NOTE — Progress Notes (Signed)
Subjective:   Patient ID: Gordon Madden, male   DOB: 67 y.o.   MRN: [id number]   HPI Patient presents with ingrown toenail deformity of the left big toe that is incurvated in the lateral border and making it hard to wear shoe gear with no drainage noted but quite a bit of pain   ROS      Objective:  Physical Exam  Neurovascular status intact with A1c at 6.4 with patient noted to have a very incurvated left hallux lateral border that is painful when pressed and make shoe gear difficult with no active drainage or redness noted     Assessment:  Ingrown toenail deformity left hallux with structural changes of the nail itself and no indication of infection     Plan:  H&P reviewed condition and recommended correction of the nailbed and explained procedure to patient.  Patient wants surgery understanding risk and today I allowed him to sign consent form.  I infiltrated the left hallux 60 mill grams like Marcaine mixture and sterile prep applied to the toe and using sterile instrumentation remove the lateral border exposed matrix and applied phenol 3 applications 30 seconds followed by alcohol lavage and sterile dressing.  Given instructions on soaks and reappoint and encouraged to call with any questions concerns

## 2018-09-17 ENCOUNTER — Other Ambulatory Visit: Payer: Self-pay | Admitting: *Deleted

## 2018-09-17 MED ORDER — SACUBITRIL-VALSARTAN 97-103 MG PO TABS: 1.0000 | ORAL_TABLET | Freq: Two times a day (BID) | ORAL | 1 refills | Status: DC

## 2018-09-17 MED ORDER — CARVEDILOL 6.25 MG PO TABS: 6.2500 mg | ORAL_TABLET | Freq: Two times a day (BID) | ORAL | 1 refills | Status: DC

## 2018-10-08 IMAGING — DX DG KNEE 1-2V PORT*R*
2 series · 2 of 2 positions shown · non-contrast
Comparison: None in PACs

CLINICAL DATA: Status post right unicompartmental knee replacement

EXAM:
PORTABLE RIGHT KNEE - 1-2 VIEW

[knee ap]
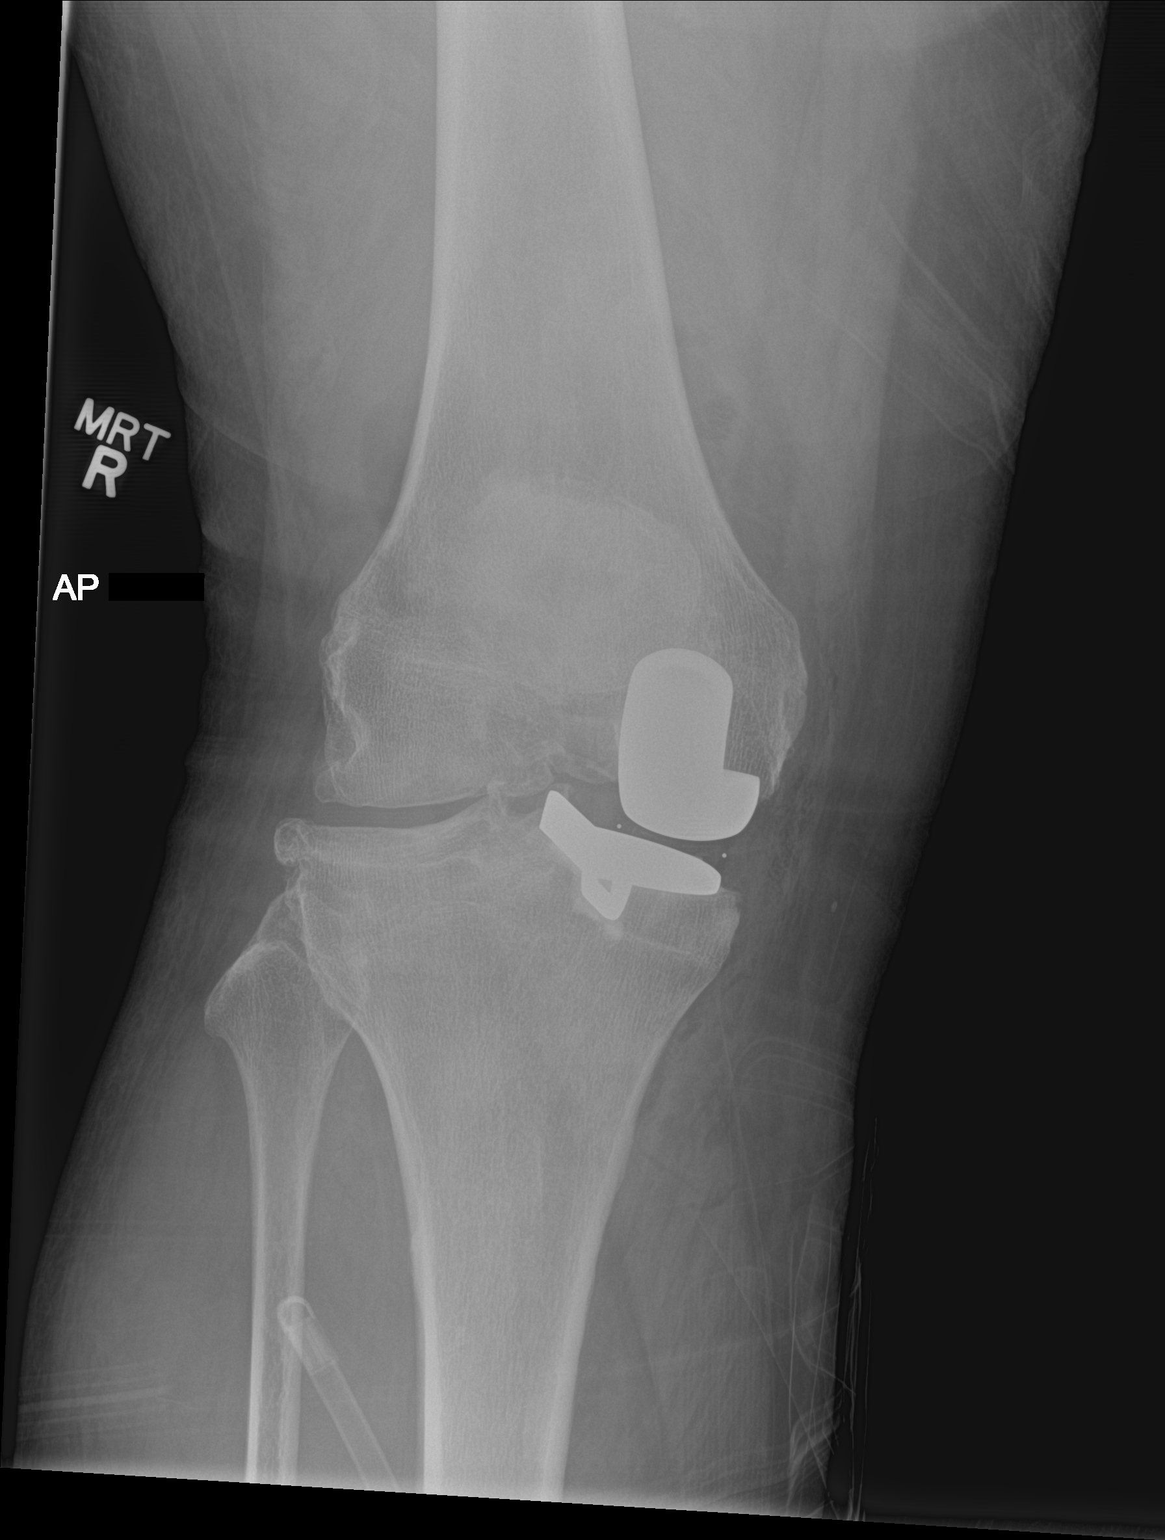

[knee lat]
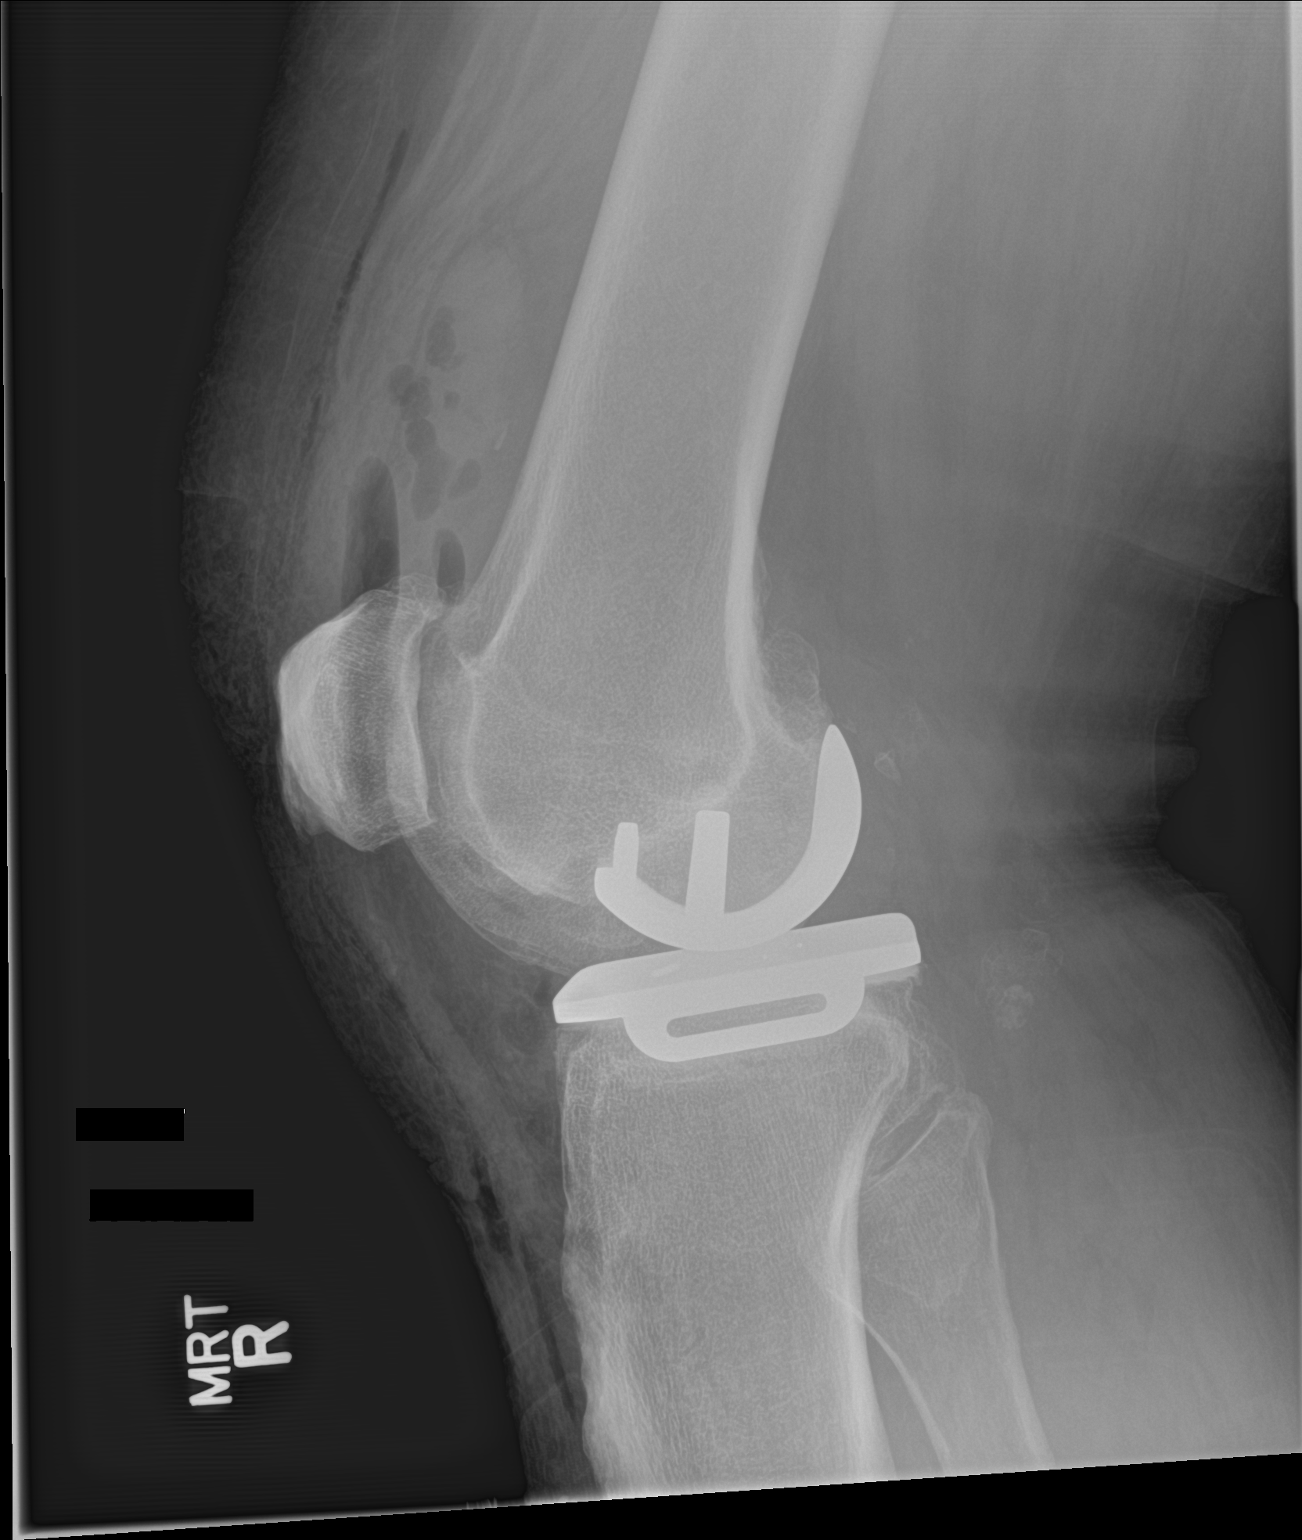

[2 of 2 positions shown; findings below may reference images not displayed]

FINDINGS: The patient has under gone medial knee joint prosthesis placement.
Radiographic positioning of the prosthetic components is good. There
is fluid and gas within the anterior soft tissues. There is mild
narrowing of the lateral joint compartment. There is beaking of the
tibial spines. Spurs arise from the articular margins of the lateral
femoral condyle and lateral tibial plateau.
IMPRESSION: No immediate postprocedure complication following medial compartment
knee replacement.

## 2018-10-29 DIAGNOSIS — H5703 Miosis: Secondary | ICD-10-CM | POA: Diagnosis not present

## 2018-10-29 DIAGNOSIS — E119 Type 2 diabetes mellitus without complications: Secondary | ICD-10-CM | POA: Diagnosis not present

## 2018-10-29 DIAGNOSIS — H2513 Age-related nuclear cataract, bilateral: Secondary | ICD-10-CM | POA: Diagnosis not present

## 2018-11-03 NOTE — Progress Notes (Signed)
Cardiology Office Note   Date:  11/05/2018   ID:  Gordon Madden, DOB 08/31/1951, MRN [id number]  PCP:  Kelton Pillar, MD    No chief complaint on file.  CAD  Wt Readings from Last 3 Encounters:  11/05/18 (!) 343 lb (155.6 kg)  03/24/18 (!) 344 lb (156 kg)  09/03/17 (!) 327 lb 12.8 oz (148.7 kg)       History of Present Illness: Gordon Madden is a 67 y.o. male  with history of CAD,Status post MI in 2004 and found to have a totally occluded RCA at [facility] in [place]. Last stress Myoview 05/2014 low risk with large inferior lateral scar and minimal peri-infarct ischemia EF 35% with inferolateral akinesis/mild dyskinesis. Felt to be low risk. 2-D echo 05/2014 EF 40-40%.chronic systolic CHF with EF 09-32%, diabetes mellitus, CVA, gout, HTN, HLD, OSA, BPH, morbid obesity, chronic DOE, chronic thrombocytopenia, arthritis, mildly dilated aorta.  Patient sawDayna Dunn/PA-C 07/10/17 for surgical clearance before undergoing right knee surgery. He was having worsening shortness of breath. 2-D echo shows LVEF 35-40% with global hypokinesis and grade 1 DD-worse than before. Nuclear stress test 07/18/17 LVEF 36% with fixed large severe basal inferior and basal to mid inferior lateral defect but no ischemia.Ireviewed and felt he could proceed with surgery and to avoid excessive IV fluids. 6/18BNP was normal.  He had right knee surgery and tolerated it well, in 9/18.   Nosebleed in 2/19 led to aspirin being stopped.  He continues to take Plavix. No further bleeding issues since that time.   He had some procedures on ingrown toe nails.  It oozed for several months, but resolved.  Dr. [name] did this procedure.   Denies : Chest pain. Dizziness. Leg edema. Nitroglycerin use. Orthopnea. Palpitations. Paroxysmal nocturnal dyspnea. Syncope.   Has not been exercising in the bad weather.  Feels well when he does walk.   He has gained weight since the last visit. Not  taking the Lasix daily.  Using it when more convenient.    He is planning a trip to [place] for Christmas.  Parents are still independent.     Past Medical History:  Diagnosis Date  . Arthritis   . Ascending aorta dilatation (HCC)   . BPH (benign prostatic hyperplasia)   . CAD (coronary artery disease)    a. MI 2004 with occ RCA.  . Cardiomyopathy    EF 40%  . Chronic systolic CHF (congestive heart failure) ([name])   . Colon polyps   . CPAP (continuous positive airway pressure) dependence   . CVA (cerebral infarction)    Renal artery branch occlusion  . Diabetes mellitus, type 2 ([name])   . History of gout   . Hyperlipidemia   . Hypertension   . Idiopathic thrombocytopenia ([name])   . Morbid obesity ([name])   . Myocardial infarction ([name])   . OSA (obstructive sleep apnea)    07/25/10 AHI 14.9/hr  . Pneumonia   . PONV (postoperative nausea and vomiting)   . Primary localized osteoarthritis of left knee 06/07/2015  . Primary localized osteoarthritis of right knee 08/06/2017  . Rhinosinusitis   . Shortness of breath dyspnea   . Stroke ([name])   . Vision decreased     Past Surgical History:  Procedure Laterality Date  . CARDIAC CATHETERIZATION  2005   no stents  . CYST EXCISION     chest and back  . HERNIA REPAIR     umbilical  .  JOINT REPLACEMENT     Left 06/07/15  . KNEE CARTILAGE SURGERY     left  . KNEE SURGERY Right 08/06/2017  . MENISCUS REPAIR Left   . NASAL SINUS SURGERY    . PARTIAL KNEE ARTHROPLASTY Right 08/06/2017   Procedure: UNICOMPARTMENTAL KNEE;  Surgeon: [name], MD;  Location: [facility];  Service: Orthopedics;  Laterality: Right;  . SINUS IRRIGATION    . TOTAL KNEE ARTHROPLASTY Left 06/07/2015   Procedure: TOTAL KNEE ARTHROPLASTY;  Surgeon: [name], MD;  Location: [facility];  Service: Orthopedics;  Laterality: Left;  . UMBILICAL HERNIA REPAIR       Current Outpatient Medications  Medication Sig Dispense Refill  . carvedilol (COREG) 6.25 MG tablet  Take 1 tablet (6.25 mg total) by mouth 2 (two) times daily. 180 tablet 1  . clopidogrel (PLAVIX) 75 MG tablet Take 75 mg by mouth daily.    . Coenzyme Q10 (CO Q 10 PO) Take 1 tablet by mouth daily.    . fexofenadine (ALLEGRA) 180 MG tablet Take 180 mg by mouth daily.    . folic acid (FOLVITE) 462 MCG tablet Take 400 mcg by mouth daily.    . furosemide (LASIX) 40 MG tablet Take 40 mg by mouth daily.    [name] ibuprofen (ADVIL,MOTRIN) 200 MG tablet Take 200 mg by mouth 3 (three) times daily.    . metFORMIN (GLUCOPHAGE) 500 MG tablet Take 1,000 mg by mouth 2 (two) times daily with a meal.     . pravastatin (PRAVACHOL) 40 MG tablet Take 40 mg by mouth daily.    . sacubitril-valsartan (ENTRESTO) 97-103 MG Take 1 tablet by mouth 2 (two) times daily. 180 tablet 1  . vitamin B-12 (CYANOCOBALAMIN) 1000 MCG tablet Take 1,000 mcg by mouth daily.     No current facility-administered medications for this visit.     Allergies:   Plavix [clopidogrel]    Social History:  The patient  reports that he has quit smoking. His smoking use included cigars and cigarettes. He has a 60.00 pack-year smoking history. He has never used smokeless tobacco. He reports that he drinks alcohol. He reports that he does not use drugs.   Family History:  The patient's family history includes Hypertension in his father.    ROS:  Please see the history of present illness.   Otherwise, review of systems are positive for weight gain.   All other systems are reviewed and negative.    PHYSICAL EXAM: VS:  BP 120/82   Pulse 96   Ht 5\' 11"  (1.803 m)   Wt (!) 343 lb (155.6 kg)   SpO2 95%   BMI 47.84 kg/m  , BMI Body mass index is 47.84 kg/m. GEN: Well nourished, well developed, in no acute distress  HEENT: normal  Neck: no JVD, carotid bruits, or masses Cardiac: RRR; 2/6 murmur, no rubs, or gallops,tr LE edema  Respiratory:  clear to auscultation bilaterally, normal work of breathing GI: soft, nontender, nondistended, + BS MS:  no deformity or atrophy  Skin: warm and dry, no rash Neuro:  Strength and sensation are intact Psych: euthymic mood, full affect   EKG:   The ekg ordered today demonstrates NSR, inferior Q waves   Recent Labs: No results found for requested labs within last 8760 hours.   Lipid Panel No results found for: CHOL, TRIG, HDL, CHOLHDL, VLDL, LDLCALC, LDLDIRECT   Other studies Reviewed: Additional studies/ records that were reviewed today with results demonstrating: labs reviewed, LDL 75 in  9/19.   ASSESSMENT AND PLAN:   1. CAD: Known CAD. Medically managed.  2. Chronic systolic heart failure/ICM:  3. HTN: The current medical regimen is effective;  continue present plan and medications. 4. Tobacco abuse: Given up his tobacco except for the occasional cigar. 5. Mitral regurgitation: No CHF sx.   6. Hyperlipidemia: The current medical regimen is effective;  continue present plan and medications. 7. DM: A1C 6.4. Spoke about weight loss.  Was in the 320 lb. range in the summer.     Current medicines are reviewed at length with the patient today.  The patient concerns regarding his medicines were addressed.  The following changes have been made:  No change  Labs/ tests ordered today include:  No orders of the defined types were placed in this encounter.   Recommend 150 minutes/week of aerobic exercise Low fat, low carb, high fiber diet recommended  Disposition:   FU in 6 months   Signed, Larae Grooms, MD  11/05/2018 9:40 AM    [facility] [address], [place], [place]  [address] Phone: [phone]; Fax: [phone]

## 2018-11-05 ENCOUNTER — Ambulatory Visit: Payer: Medicare HMO | Admitting: Interventional Cardiology

## 2018-11-05 ENCOUNTER — Encounter: Payer: Self-pay | Admitting: Interventional Cardiology

## 2018-11-05 VITALS — BP 120/82 | HR 96 | Ht 71.0 in | Wt 343.0 lb

## 2018-11-05 DIAGNOSIS — I25118 Atherosclerotic heart disease of native coronary artery with other forms of angina pectoris: Secondary | ICD-10-CM

## 2018-11-05 DIAGNOSIS — I10 Essential (primary) hypertension: Secondary | ICD-10-CM

## 2018-11-05 DIAGNOSIS — E782 Mixed hyperlipidemia: Secondary | ICD-10-CM | POA: Diagnosis not present

## 2018-11-05 DIAGNOSIS — E1159 Type 2 diabetes mellitus with other circulatory complications: Secondary | ICD-10-CM | POA: Diagnosis not present

## 2018-11-05 NOTE — Patient Instructions (Signed)

## 2018-12-18 DIAGNOSIS — Z0100 Encounter for examination of eyes and vision without abnormal findings: Secondary | ICD-10-CM | POA: Diagnosis not present

## 2019-02-13 ENCOUNTER — Other Ambulatory Visit: Payer: Self-pay | Admitting: Interventional Cardiology

## 2019-02-18 DIAGNOSIS — I2510 Atherosclerotic heart disease of native coronary artery without angina pectoris: Secondary | ICD-10-CM | POA: Diagnosis not present

## 2019-02-18 DIAGNOSIS — I429 Cardiomyopathy, unspecified: Secondary | ICD-10-CM | POA: Diagnosis not present

## 2019-02-18 DIAGNOSIS — D696 Thrombocytopenia, unspecified: Secondary | ICD-10-CM | POA: Diagnosis not present

## 2019-02-18 DIAGNOSIS — Z7984 Long term (current) use of oral hypoglycemic drugs: Secondary | ICD-10-CM | POA: Diagnosis not present

## 2019-02-18 DIAGNOSIS — I119 Hypertensive heart disease without heart failure: Secondary | ICD-10-CM | POA: Diagnosis not present

## 2019-02-18 DIAGNOSIS — N181 Chronic kidney disease, stage 1: Secondary | ICD-10-CM | POA: Diagnosis not present

## 2019-02-18 DIAGNOSIS — E1121 Type 2 diabetes mellitus with diabetic nephropathy: Secondary | ICD-10-CM | POA: Diagnosis not present

## 2019-02-18 DIAGNOSIS — E782 Mixed hyperlipidemia: Secondary | ICD-10-CM | POA: Diagnosis not present

## 2019-02-19 ENCOUNTER — Other Ambulatory Visit: Payer: Self-pay | Admitting: Interventional Cardiology

## 2019-03-06 DIAGNOSIS — G4733 Obstructive sleep apnea (adult) (pediatric): Secondary | ICD-10-CM | POA: Diagnosis not present

## 2019-03-06 DIAGNOSIS — G4710 Hypersomnia, unspecified: Secondary | ICD-10-CM | POA: Diagnosis not present

## 2019-03-18 DIAGNOSIS — M25561 Pain in right knee: Secondary | ICD-10-CM | POA: Diagnosis not present

## 2019-03-18 DIAGNOSIS — M25562 Pain in left knee: Secondary | ICD-10-CM | POA: Diagnosis not present

## 2019-03-18 DIAGNOSIS — M5431 Sciatica, right side: Secondary | ICD-10-CM | POA: Diagnosis not present

## 2019-04-15 ENCOUNTER — Other Ambulatory Visit: Payer: Self-pay | Admitting: Orthopedic Surgery

## 2019-04-15 DIAGNOSIS — M545 Low back pain, unspecified: Secondary | ICD-10-CM

## 2019-04-25 ENCOUNTER — Other Ambulatory Visit: Payer: Self-pay

## 2019-04-25 ENCOUNTER — Ambulatory Visit
Admission: RE | Admit: 2019-04-25 | Discharge: 2019-04-25 | Disposition: A | Discharge status: Home / Self Care | Payer: Medicare HMO | Source: Ambulatory Visit | Attending: Orthopedic Surgery | Admitting: Orthopedic Surgery

## 2019-04-25 DIAGNOSIS — M48061 Spinal stenosis, lumbar region without neurogenic claudication: Secondary | ICD-10-CM | POA: Diagnosis not present

## 2019-04-25 DIAGNOSIS — M545 Low back pain, unspecified: Secondary | ICD-10-CM

## 2019-04-28 DIAGNOSIS — M5416 Radiculopathy, lumbar region: Secondary | ICD-10-CM | POA: Diagnosis not present

## 2019-04-28 DIAGNOSIS — M47816 Spondylosis without myelopathy or radiculopathy, lumbar region: Secondary | ICD-10-CM | POA: Diagnosis not present

## 2019-04-28 DIAGNOSIS — M545 Low back pain: Secondary | ICD-10-CM | POA: Diagnosis not present

## 2019-04-28 DIAGNOSIS — M48061 Spinal stenosis, lumbar region without neurogenic claudication: Secondary | ICD-10-CM | POA: Diagnosis not present

## 2019-05-04 DIAGNOSIS — M545 Low back pain: Secondary | ICD-10-CM | POA: Diagnosis not present

## 2019-05-04 DIAGNOSIS — M48061 Spinal stenosis, lumbar region without neurogenic claudication: Secondary | ICD-10-CM | POA: Diagnosis not present

## 2019-05-06 DIAGNOSIS — M545 Low back pain: Secondary | ICD-10-CM | POA: Diagnosis not present

## 2019-05-06 DIAGNOSIS — M48061 Spinal stenosis, lumbar region without neurogenic claudication: Secondary | ICD-10-CM | POA: Diagnosis not present

## 2019-05-11 DIAGNOSIS — M48061 Spinal stenosis, lumbar region without neurogenic claudication: Secondary | ICD-10-CM | POA: Diagnosis not present

## 2019-05-11 DIAGNOSIS — M545 Low back pain: Secondary | ICD-10-CM | POA: Diagnosis not present

## 2019-05-13 DIAGNOSIS — M48061 Spinal stenosis, lumbar region without neurogenic claudication: Secondary | ICD-10-CM | POA: Diagnosis not present

## 2019-05-13 DIAGNOSIS — M545 Low back pain: Secondary | ICD-10-CM | POA: Diagnosis not present

## 2019-05-18 DIAGNOSIS — M545 Low back pain: Secondary | ICD-10-CM | POA: Diagnosis not present

## 2019-05-18 DIAGNOSIS — M48061 Spinal stenosis, lumbar region without neurogenic claudication: Secondary | ICD-10-CM | POA: Diagnosis not present

## 2019-05-20 DIAGNOSIS — M48061 Spinal stenosis, lumbar region without neurogenic claudication: Secondary | ICD-10-CM | POA: Diagnosis not present

## 2019-05-20 DIAGNOSIS — M545 Low back pain: Secondary | ICD-10-CM | POA: Diagnosis not present

## 2019-05-22 DIAGNOSIS — K529 Noninfective gastroenteritis and colitis, unspecified: Secondary | ICD-10-CM | POA: Diagnosis not present

## 2019-05-22 DIAGNOSIS — Z7984 Long term (current) use of oral hypoglycemic drugs: Secondary | ICD-10-CM | POA: Diagnosis not present

## 2019-05-22 DIAGNOSIS — Z86010 Personal history of colonic polyps: Secondary | ICD-10-CM | POA: Diagnosis not present

## 2019-05-25 DIAGNOSIS — M545 Low back pain: Secondary | ICD-10-CM | POA: Diagnosis not present

## 2019-05-25 DIAGNOSIS — M48061 Spinal stenosis, lumbar region without neurogenic claudication: Secondary | ICD-10-CM | POA: Diagnosis not present

## 2019-05-27 DIAGNOSIS — M545 Low back pain: Secondary | ICD-10-CM | POA: Diagnosis not present

## 2019-05-27 DIAGNOSIS — M48061 Spinal stenosis, lumbar region without neurogenic claudication: Secondary | ICD-10-CM | POA: Diagnosis not present

## 2019-06-01 DIAGNOSIS — M48061 Spinal stenosis, lumbar region without neurogenic claudication: Secondary | ICD-10-CM | POA: Diagnosis not present

## 2019-06-01 DIAGNOSIS — M545 Low back pain: Secondary | ICD-10-CM | POA: Diagnosis not present

## 2019-06-03 DIAGNOSIS — M545 Low back pain: Secondary | ICD-10-CM | POA: Diagnosis not present

## 2019-06-03 DIAGNOSIS — M48061 Spinal stenosis, lumbar region without neurogenic claudication: Secondary | ICD-10-CM | POA: Diagnosis not present

## 2019-06-10 DIAGNOSIS — M48061 Spinal stenosis, lumbar region without neurogenic claudication: Secondary | ICD-10-CM | POA: Diagnosis not present

## 2019-06-10 DIAGNOSIS — M545 Low back pain: Secondary | ICD-10-CM | POA: Diagnosis not present

## 2019-06-12 ENCOUNTER — Telehealth: Payer: Self-pay | Admitting: *Deleted

## 2019-06-12 ENCOUNTER — Ambulatory Visit: Payer: Medicare HMO | Admitting: Internal Medicine

## 2019-06-12 DIAGNOSIS — M545 Low back pain: Secondary | ICD-10-CM | POA: Diagnosis not present

## 2019-06-12 DIAGNOSIS — M48061 Spinal stenosis, lumbar region without neurogenic claudication: Secondary | ICD-10-CM | POA: Diagnosis not present

## 2019-06-12 NOTE — Telephone Encounter (Signed)
Dr Irish Lack- OK to hold Plavix- MI in '04-medical Rx, DM, CM.  Myoview was low risk (scar) in 2018.  [name] PA-C 06/12/2019 3:29 PM

## 2019-06-12 NOTE — Telephone Encounter (Signed)
   [facility] Pre-operative Risk Assessment    Request for surgical clearance:  1. What type of surgery is being performed? COLONSCOPY  2. When is this surgery scheduled? 07/07/19 OR 07/10/19, HARD TO TELL ON FAX WHICH DATE   3. What type of clearance is required (medical clearance vs. Pharmacy clearance to hold med vs. Both)? MEDICAL  4. Are there any medications that need to be held prior to surgery and how long? PLAVIX   5. Practice name and name of physician performing surgery? [facility]; DR. MAGOD   6. What is your office phone number [phone]   7.   What is your office fax number [phone]  8.   Anesthesia type (None, local, MAC, general) ? NOT LISTED; PROPOFOL ?   Julaine Hua 06/12/2019, 3:03 PM  _________________________________________________________________   (provider comments below)

## 2019-06-12 NOTE — Telephone Encounter (Signed)
OK to hold Plavix 5 days prior to colonoscopy.  

## 2019-06-15 NOTE — Telephone Encounter (Signed)
    Primary Cardiologist: Larae Grooms, MD  Chart reviewed as part of pre-operative protocol coverage. Patient was contacted 06/15/2019 in reference to pre-operative risk assessment for pending surgery as outlined below.  Gordon Madden was last seen on 11/05/2018 by Dr. Irish Lack.  Since that day, Gordon Madden has done well from a cardiac perspective. He denies chest pain or other anginal equivalent. He is without complaints on our discussion today.   Therefore, based on ACC/AHA guidelines, the patient would be at acceptable risk for the planned procedure without further cardiovascular testing.   Per Dr. Irish Lack, the patient may hold his Plavix for 5 days prior to procedure and resume as soon as possible per procedure team guidelines.   I will route this recommendation to the requesting party via Epic fax function and remove from pre-op pool.  Please call with questions.  Kathyrn Drown, NP 06/15/2019, 7:55 AM

## 2019-06-22 ENCOUNTER — Encounter: Payer: Self-pay | Admitting: Internal Medicine

## 2019-06-22 DIAGNOSIS — M48061 Spinal stenosis, lumbar region without neurogenic claudication: Secondary | ICD-10-CM | POA: Diagnosis not present

## 2019-06-22 DIAGNOSIS — M47816 Spondylosis without myelopathy or radiculopathy, lumbar region: Secondary | ICD-10-CM | POA: Diagnosis not present

## 2019-06-22 DIAGNOSIS — M545 Low back pain: Secondary | ICD-10-CM | POA: Diagnosis not present

## 2019-06-23 ENCOUNTER — Ambulatory Visit (INDEPENDENT_AMBULATORY_CARE_PROVIDER_SITE_OTHER): Payer: Medicare HMO | Admitting: Internal Medicine

## 2019-06-23 ENCOUNTER — Encounter: Payer: Self-pay | Admitting: Internal Medicine

## 2019-06-23 ENCOUNTER — Other Ambulatory Visit: Payer: Self-pay

## 2019-06-23 VITALS — BP 130/80 | HR 66 | Temp 98.1°F | Ht 71.0 in | Wt 348.6 lb

## 2019-06-23 DIAGNOSIS — Z6841 Body Mass Index (BMI) 40.0 and over, adult: Secondary | ICD-10-CM | POA: Diagnosis not present

## 2019-06-23 DIAGNOSIS — G4733 Obstructive sleep apnea (adult) (pediatric): Secondary | ICD-10-CM | POA: Diagnosis not present

## 2019-06-23 NOTE — Patient Instructions (Signed)
Order- DME [employer]- please replace mask of choice, humidifier, supplies, AirView/ card  Please change auto range to 8-12  Please let us know if we can help

## 2019-06-23 NOTE — Progress Notes (Signed)
Patient ID: Gordon Madden, male    DOB: 1951-09-26, 68 y.o.   MRN: [id number]  HPI male cigar smoker followed for OSA, insomnia, rhinitis, complicated by DM, HBP, CAD/MI, CVA/stroke NPSG 07/25/2010 AHI 14.9 per hour Office Spirometry 05/15/17-moderate restriction of exhaled volume and at least mild obstruction. FVC 2.66/55%, FEV1 2.07/58%, ratio 0.78, FEF 25-75% 1.91/69% ------------------------------------------------------------------------------------------------  05/15/17- 68 year old male former smoker followed for OSA, insomnia, rhinitis, complicated by DM, HBP, CAD/MI, CVA/stroke CPAP  Auto 5-15/Advanced 1 yr for OSA. States that he has some SOB right after putting the machine on but it goes away. Uses [facility] as DME Body weight today 344 pounds      finally quit cigar smoking in January Download 77% 4 hour, AHI 0.3/hour. He had missed a couple of nights and had some short nights just in the last week. Mild dyspnea as he first lies down, whether he has mask on or not. Feels "not enough air initially". Fine later in the night. Also more short of breath during the day without significant cough or wheeze. Office Spirometry 05/15/17-moderate restriction of exhaled volume and at least mild obstruction. FVC 2.66/55%, FEV1 2.07/58%, ratio 0.78, FEF 25-75% 1.91/69%.  06/23/2019- 68 year old male former smoker followed for OSA, insomnia, rhinitis, complicated by DM2, HBP, CAD/MI, CVA/stroke CPAP  Auto 10-15/Advanced Download compliance 100%, AHI 0.1/ hr Body weight today 348 lbs -----OSA on CPAP, dme: [employer]; no complaints Machine is 70 yrs old and working well for him. Needs new supplies. Nasal pillow mask. CXR 05/15/17-  IMPRESSION: Cardiomegaly with mild pulmonary interstitial prominence. Mild CHF cannot be excluded. No new cardiac issues.   Review of Systems-see HPI   + = positive Constitutional:   No-   weight loss, night sweats, fevers, chills, fatigue, lassitude. HEENT:   No-  headaches,  difficulty swallowing, tooth/dental problems, sore throat,      No-sneezing, itching, ear ache, nasal congestion, post nasal drip,  CV:  +chest pain, orthopnea, PND, swelling in lower extremities, anasarca, dizziness, palpitations Resp: +  shortness of breath with exertion or at rest.              No-   productive cough,  No non-productive cough,  No-  coughing up of blood.              No-   change in color of mucus.  No- wheezing.   Skin: No-   rash or lesions. GI:  No-   heartburn, indigestion, abdominal pain, +nausea,  GU:  MS:  +joint pain or swelling. . Neuro- nothing unusual  Psych:  No- change in mood or affect. No depression or anxiety.  No memory loss.  Objective:   Physical Exam General- Alert, Oriented, Affect-appropriate, Distress- none acute;  + Morbidly obese Skin- + cool, clammy Lymphadenopathy- none Head- atraumatic            Eyes- Gross vision intact, PERRLA, conjunctivae clear secretions            Ears- Hearing, canals normal            Nose- Clear, Septal dev- not apparent from anterior., mucus, polyps, erosion, perforation             Throat- Mallampati III , mucosa clear , drainage- none, tonsils- atrophic Neck- flexible , trachea midline, no stridor , thyroid nl, carotid no bruit Chest - symmetrical excursion , unlabored           Heart/CV- RRR , no murmur , no gallop  ,  no rub, nl s1 s2                           - JVD- none , edema- none, stasis changes+, varices- none           Lung- clear to P&A, wheeze- none, cough- none , dullness-none, rub- none           Chest wall-  Abd-  Br/ Gen/ Rectal- Not done, not indicated Extrem- cyanosis- none, clubbing, none, atrophy- none, strength- nl, + surgical scar left knee Neuro- grossly intact to observation

## 2019-07-10 DIAGNOSIS — D125 Benign neoplasm of sigmoid colon: Secondary | ICD-10-CM | POA: Diagnosis not present

## 2019-07-10 DIAGNOSIS — D123 Benign neoplasm of transverse colon: Secondary | ICD-10-CM | POA: Diagnosis not present

## 2019-07-10 DIAGNOSIS — D120 Benign neoplasm of cecum: Secondary | ICD-10-CM | POA: Diagnosis not present

## 2019-07-10 DIAGNOSIS — D128 Benign neoplasm of rectum: Secondary | ICD-10-CM | POA: Diagnosis not present

## 2019-07-10 DIAGNOSIS — K5730 Diverticulosis of large intestine without perforation or abscess without bleeding: Secondary | ICD-10-CM | POA: Diagnosis not present

## 2019-07-10 DIAGNOSIS — K635 Polyp of colon: Secondary | ICD-10-CM | POA: Diagnosis not present

## 2019-07-10 DIAGNOSIS — Z86010 Personal history of colonic polyps: Secondary | ICD-10-CM | POA: Diagnosis not present

## 2019-07-14 DIAGNOSIS — K635 Polyp of colon: Secondary | ICD-10-CM | POA: Diagnosis not present

## 2019-07-14 DIAGNOSIS — D120 Benign neoplasm of cecum: Secondary | ICD-10-CM | POA: Diagnosis not present

## 2019-07-14 DIAGNOSIS — D123 Benign neoplasm of transverse colon: Secondary | ICD-10-CM | POA: Diagnosis not present

## 2019-07-14 DIAGNOSIS — D125 Benign neoplasm of sigmoid colon: Secondary | ICD-10-CM | POA: Diagnosis not present

## 2019-07-14 DIAGNOSIS — D128 Benign neoplasm of rectum: Secondary | ICD-10-CM | POA: Diagnosis not present

## 2019-07-22 ENCOUNTER — Other Ambulatory Visit: Payer: Self-pay | Admitting: Interventional Cardiology

## 2019-07-22 MED ORDER — CARVEDILOL 6.25 MG PO TABS: 6.2500 mg | ORAL_TABLET | Freq: Two times a day (BID) | ORAL | 0 refills | Status: DC

## 2019-07-22 NOTE — Telephone Encounter (Signed)
°*  STAT* If patient is at the pharmacy, call can be transferred to refill team.   1. Which medications need to be refilled? (please list name of each medication and dose if known) needs a new prescription called to his local RX until mail order comes for Carvedilol  2. Which pharmacy/location (including street and city if local pharmacy) is medication to be sent to? [name] [address], [place]  3. Do they need a 30 day or 90 day supply?  14 in case order does not come at expected time

## 2019-07-22 NOTE — Telephone Encounter (Signed)
Pt's medication was sent to pt's pharmacy for a 10 day supply until mail order arrives. Confirmation received.

## 2019-08-09 NOTE — Assessment & Plan Note (Signed)
Benefits from CPAP with good compliance and control Plan- continue auto 10-15 

## 2019-08-09 NOTE — Assessment & Plan Note (Signed)
He has not been able to change lifestyle sufficiently to accomplish meaningful weight loss. Emphasized and encouraged.

## 2019-08-28 ENCOUNTER — Ambulatory Visit: Payer: Medicare HMO | Admitting: Interventional Cardiology

## 2019-08-28 ENCOUNTER — Encounter: Payer: Self-pay | Admitting: Interventional Cardiology

## 2019-08-28 ENCOUNTER — Other Ambulatory Visit: Payer: Self-pay

## 2019-08-28 VITALS — BP 122/74 | HR 66 | Ht 71.0 in | Wt 337.8 lb

## 2019-08-28 DIAGNOSIS — I5022 Chronic systolic (congestive) heart failure: Secondary | ICD-10-CM | POA: Diagnosis not present

## 2019-08-28 DIAGNOSIS — I340 Nonrheumatic mitral (valve) insufficiency: Secondary | ICD-10-CM

## 2019-08-28 DIAGNOSIS — I2510 Atherosclerotic heart disease of native coronary artery without angina pectoris: Secondary | ICD-10-CM | POA: Diagnosis not present

## 2019-08-28 DIAGNOSIS — I10 Essential (primary) hypertension: Secondary | ICD-10-CM

## 2019-08-28 DIAGNOSIS — F17200 Nicotine dependence, unspecified, uncomplicated: Secondary | ICD-10-CM | POA: Diagnosis not present

## 2019-08-28 DIAGNOSIS — I25118 Atherosclerotic heart disease of native coronary artery with other forms of angina pectoris: Secondary | ICD-10-CM

## 2019-08-28 DIAGNOSIS — E1159 Type 2 diabetes mellitus with other circulatory complications: Secondary | ICD-10-CM

## 2019-08-28 NOTE — Progress Notes (Signed)
Cardiology Office Note   Date:  08/28/2019   ID:  BERNARR CRISTINA, DOB July 19, 1951, MRN [id number]  PCP:  Kelton Pillar, MD    No chief complaint on file.  CAD  Wt Readings from Last 3 Encounters:  08/28/19 (!) 337 lb 12.8 oz (153.2 kg)  06/23/19 (!) 348 lb 9.6 oz (158.1 kg)  11/05/18 (!) 343 lb (155.6 kg)       History of Present Illness: Gordon Madden is a 68 y.o. male   with history of CAD,Status post MI in 2004 and found to have a totally occluded RCA at [facility] in [place]. Last stress Myoview 05/2014 low risk with large inferior lateral scar and minimal peri-infarct ischemia EF 35% with inferolateral akinesis/mild dyskinesis. Felt to be low risk. 2-D echo 05/2014 EF 40-40%.chronic systolic CHF with EF [id number], diabetes mellitus, CVA, gout, HTN, HLD, OSA, BPH, morbid obesity, chronic DOE, chronic thrombocytopenia, arthritis, mildly dilated aorta.  Patient sawDayna Dunn/PA-C 07/10/17 for surgical clearance before undergoing right knee surgery. He was having worsening shortness of breath. 2-D echo shows LVEF 35-40% with global hypokinesis and grade 1 DD-worse than before. Nuclear stress test 07/18/17 LVEF 36% with fixed large severe basal inferior and basal to mid inferior lateral defect but no ischemia.Ireviewed and felt he could proceed with surgery and to avoid excessive IV fluids. 6/18BNP was normal.  He had right knee surgery and tolerated it well, in 9/18.  Nosebleed in 2/19 led to aspirin being stopped. He continues to take Plavix. No further bleeding issues since that time.   He had some procedures on ingrown toe nails.  It oozed for several months, but resolved.  Dr. [name] did this procedure.   Denies : Chest pain. Dizziness. Leg edema. Nitroglycerin use. Orthopnea. Palpitations. Paroxysmal nocturnal dyspnea. Shortness of breath. Syncope.   He walks regularly.  He has gained weight during stay at home for [place].    He is now exercising  more and has lost some weight.       Past Medical History:  Diagnosis Date  . Arthritis   . Ascending aorta dilatation (HCC)   . BPH (benign prostatic hyperplasia)   . CAD (coronary artery disease)    a. MI 2004 with occ RCA.  . Cardiomyopathy    EF 40%  . Chronic systolic CHF (congestive heart failure) ([place])   . Colon polyps   . CPAP (continuous positive airway pressure) dependence   . CVA (cerebral infarction)    Renal artery branch occlusion  . Diabetes mellitus, type 2 ([place])   . History of gout   . Hyperlipidemia   . Hypertension   . Idiopathic thrombocytopenia ([place])   . Morbid obesity ([place])   . Myocardial infarction ([place])   . OSA (obstructive sleep apnea)    07/25/10 AHI 14.9/hr  . Pneumonia   . PONV (postoperative nausea and vomiting)   . Primary localized osteoarthritis of left knee 06/07/2015  . Primary localized osteoarthritis of right knee 08/06/2017  . Rhinosinusitis   . Shortness of breath dyspnea   . Stroke ([place])   . Vision decreased     Past Surgical History:  Procedure Laterality Date  . CARDIAC CATHETERIZATION  2005   no stents  . CYST EXCISION     chest and back  . HERNIA REPAIR     umbilical  . JOINT REPLACEMENT     Left 06/07/15  . KNEE CARTILAGE SURGERY     left  .  KNEE SURGERY Right 08/06/2017  . MENISCUS REPAIR Left   . NASAL SINUS SURGERY    . PARTIAL KNEE ARTHROPLASTY Right 08/06/2017   Procedure: UNICOMPARTMENTAL KNEE;  Surgeon: [name], MD;  Location: [facility];  Service: Orthopedics;  Laterality: Right;  . SINUS IRRIGATION    . TOTAL KNEE ARTHROPLASTY Left 06/07/2015   Procedure: TOTAL KNEE ARTHROPLASTY;  Surgeon: [name], MD;  Location: [facility];  Service: Orthopedics;  Laterality: Left;  . UMBILICAL HERNIA REPAIR       Current Outpatient Medications  Medication Sig Dispense Refill  . carvedilol (COREG) 6.25 MG tablet Take 1 tablet (6.25 mg total) by mouth 2 (two) times daily. 20 tablet 0  . clopidogrel (PLAVIX) 75 MG  tablet Take 75 mg by mouth daily.    . Coenzyme Q10 (CO Q 10 PO) Take 1 tablet by mouth daily.    [name] ENTRESTO 97-103 MG TAKE 1 TABLET TWICE DAILY 180 tablet 1  . fexofenadine (ALLEGRA) 180 MG tablet Take 180 mg by mouth daily.    . folic acid (FOLVITE) A999333 MCG tablet Take 400 mcg by mouth daily.    . furosemide (LASIX) 40 MG tablet TAKE 1 TABLET EVERY DAY 90 tablet 3  . ibuprofen (ADVIL,MOTRIN) 200 MG tablet Take 200 mg by mouth 3 (three) times daily.    . metFORMIN (GLUCOPHAGE) 500 MG tablet Take 1,000 mg by mouth 2 (two) times daily with a meal.     . pravastatin (PRAVACHOL) 40 MG tablet Take 40 mg by mouth daily.    . vitamin B-12 (CYANOCOBALAMIN) 1000 MCG tablet Take 1,000 mcg by mouth daily.     No current facility-administered medications for this visit.     Allergies:   Plavix [clopidogrel]    Social History:  The patient  reports that he quit smoking about 2 years ago. His smoking use included cigars and cigarettes. He has a 60.00 pack-year smoking history. He has never used smokeless tobacco. He reports current alcohol use. He reports that he does not use drugs.   Family History:  The patient's family history includes Hypertension in his father.    ROS:  Please see the history of present illness.   Otherwise, review of systems are positive for intentional weight loss.   All other systems are reviewed and negative.    PHYSICAL EXAM: VS:  BP 122/74   Pulse 66   Ht 5\' 11"  (1.803 m)   Wt (!) 337 lb 12.8 oz (153.2 kg)   SpO2 92%   BMI 47.11 kg/m  , BMI Body mass index is 47.11 kg/m. GEN: Well nourished, well developed, in no acute distress  HEENT: normal  Neck: no JVD, carotid bruits, or masses Cardiac: RRR; premature beats, no murmurs, rubs, or gallops,tr LE edema  Respiratory:  clear to auscultation bilaterally, normal work of breathing GI: soft, nontender, nondistended, + BS MS: no deformity or atrophy  Skin: warm and dry, no rash Neuro:  Strength and sensation are  intact Psych: euthymic mood, full affect   EKG:   The ekg ordered today demonstrates NSR, inferior Q waves, PVC, IVCD   Recent Labs: No results found for requested labs within last 8760 hours.   Lipid Panel No results found for: CHOL, TRIG, HDL, CHOLHDL, VLDL, LDLCALC, LDLDIRECT   Other studies Reviewed: Additional studies/ records that were reviewed today with results demonstrating: labs reviewed.   ASSESSMENT AND PLAN:  1. CAD: No angina.  Continue aggressive secondary prevention.  2. Chronic systolic heart  failure: Appears euvolemic.   3. HTN: The current medical regimen is effective;  continue present plan and medications. 4. Tobacco abuse: Still using cigars on occasion.  Gave up cigarettes.   5. Mitral regurgitation: No CHF sx. 6. Hyperlipididemia: LDL 84.  Continue lipid lowering therapy.  7. DM: A1C 6.4.  Well controlled.    Current medicines are reviewed at length with the patient today.  The patient concerns regarding his medicines were addressed.  The following changes have been made:  No change  Labs/ tests ordered today include:  No orders of the defined types were placed in this encounter.   Recommend 150 minutes/week of aerobic exercise Low fat, low carb, high fiber diet recommended  Disposition:   FU in 1 year   Signed, Larae Grooms, MD  08/28/2019 4:42 PM    [facility] [place], [place], [place]  [address] Phone: [phone]; Fax: [phone]

## 2019-08-28 NOTE — Patient Instructions (Signed)

## 2019-09-11 MED ORDER — ENTRESTO 97-103 MG PO TABS: 1.0000 | ORAL_TABLET | Freq: Two times a day (BID) | ORAL | 3 refills | Status: DC

## 2019-09-11 MED ORDER — CARVEDILOL 6.25 MG PO TABS: 6.2500 mg | ORAL_TABLET | Freq: Two times a day (BID) | ORAL | 3 refills | Status: DC

## 2019-09-17 DIAGNOSIS — I119 Hypertensive heart disease without heart failure: Secondary | ICD-10-CM | POA: Diagnosis not present

## 2019-09-17 DIAGNOSIS — Z0000 Encounter for general adult medical examination without abnormal findings: Secondary | ICD-10-CM | POA: Diagnosis not present

## 2019-09-17 DIAGNOSIS — N181 Chronic kidney disease, stage 1: Secondary | ICD-10-CM | POA: Diagnosis not present

## 2019-09-17 DIAGNOSIS — D696 Thrombocytopenia, unspecified: Secondary | ICD-10-CM | POA: Diagnosis not present

## 2019-09-17 DIAGNOSIS — I2510 Atherosclerotic heart disease of native coronary artery without angina pectoris: Secondary | ICD-10-CM | POA: Diagnosis not present

## 2019-09-17 DIAGNOSIS — E1121 Type 2 diabetes mellitus with diabetic nephropathy: Secondary | ICD-10-CM | POA: Diagnosis not present

## 2019-09-17 DIAGNOSIS — N401 Enlarged prostate with lower urinary tract symptoms: Secondary | ICD-10-CM | POA: Diagnosis not present

## 2019-09-17 DIAGNOSIS — Z1389 Encounter for screening for other disorder: Secondary | ICD-10-CM | POA: Diagnosis not present

## 2019-09-17 DIAGNOSIS — E1122 Type 2 diabetes mellitus with diabetic chronic kidney disease: Secondary | ICD-10-CM | POA: Diagnosis not present

## 2019-09-17 DIAGNOSIS — E782 Mixed hyperlipidemia: Secondary | ICD-10-CM | POA: Diagnosis not present

## 2019-09-17 DIAGNOSIS — I130 Hypertensive heart and chronic kidney disease with heart failure and stage 1 through stage 4 chronic kidney disease, or unspecified chronic kidney disease: Secondary | ICD-10-CM | POA: Diagnosis not present

## 2019-09-17 DIAGNOSIS — G4733 Obstructive sleep apnea (adult) (pediatric): Secondary | ICD-10-CM | POA: Diagnosis not present

## 2019-09-17 DIAGNOSIS — I5022 Chronic systolic (congestive) heart failure: Secondary | ICD-10-CM | POA: Diagnosis not present

## 2019-09-25 ENCOUNTER — Other Ambulatory Visit: Payer: Self-pay | Admitting: Interventional Cardiology

## 2019-10-19 DIAGNOSIS — H2513 Age-related nuclear cataract, bilateral: Secondary | ICD-10-CM | POA: Diagnosis not present

## 2019-10-19 DIAGNOSIS — H1131 Conjunctival hemorrhage, right eye: Secondary | ICD-10-CM | POA: Diagnosis not present

## 2019-10-19 DIAGNOSIS — H02834 Dermatochalasis of left upper eyelid: Secondary | ICD-10-CM | POA: Diagnosis not present

## 2019-10-19 DIAGNOSIS — H5703 Miosis: Secondary | ICD-10-CM | POA: Diagnosis not present

## 2019-10-19 DIAGNOSIS — H02831 Dermatochalasis of right upper eyelid: Secondary | ICD-10-CM | POA: Diagnosis not present

## 2019-11-03 DIAGNOSIS — H2513 Age-related nuclear cataract, bilateral: Secondary | ICD-10-CM | POA: Diagnosis not present

## 2019-11-03 DIAGNOSIS — H2181 Floppy iris syndrome: Secondary | ICD-10-CM | POA: Diagnosis not present

## 2019-11-03 DIAGNOSIS — E119 Type 2 diabetes mellitus without complications: Secondary | ICD-10-CM | POA: Diagnosis not present

## 2019-11-03 DIAGNOSIS — H02834 Dermatochalasis of left upper eyelid: Secondary | ICD-10-CM | POA: Diagnosis not present

## 2019-11-03 DIAGNOSIS — H3560 Retinal hemorrhage, unspecified eye: Secondary | ICD-10-CM | POA: Diagnosis not present

## 2019-11-03 DIAGNOSIS — H5703 Miosis: Secondary | ICD-10-CM | POA: Diagnosis not present

## 2019-11-03 DIAGNOSIS — H02831 Dermatochalasis of right upper eyelid: Secondary | ICD-10-CM | POA: Diagnosis not present

## 2019-11-03 DIAGNOSIS — H04123 Dry eye syndrome of bilateral lacrimal glands: Secondary | ICD-10-CM | POA: Diagnosis not present

## 2019-11-03 DIAGNOSIS — H34231 Retinal artery branch occlusion, right eye: Secondary | ICD-10-CM | POA: Diagnosis not present

## 2020-01-11 ENCOUNTER — Ambulatory Visit: Payer: Medicare HMO

## 2020-03-17 DIAGNOSIS — Z87891 Personal history of nicotine dependence: Secondary | ICD-10-CM | POA: Diagnosis not present

## 2020-03-17 DIAGNOSIS — I2510 Atherosclerotic heart disease of native coronary artery without angina pectoris: Secondary | ICD-10-CM | POA: Diagnosis not present

## 2020-03-17 DIAGNOSIS — E1121 Type 2 diabetes mellitus with diabetic nephropathy: Secondary | ICD-10-CM | POA: Diagnosis not present

## 2020-03-17 DIAGNOSIS — E782 Mixed hyperlipidemia: Secondary | ICD-10-CM | POA: Diagnosis not present

## 2020-03-17 DIAGNOSIS — I5022 Chronic systolic (congestive) heart failure: Secondary | ICD-10-CM | POA: Diagnosis not present

## 2020-03-17 DIAGNOSIS — I119 Hypertensive heart disease without heart failure: Secondary | ICD-10-CM | POA: Diagnosis not present

## 2020-03-17 DIAGNOSIS — D696 Thrombocytopenia, unspecified: Secondary | ICD-10-CM | POA: Diagnosis not present

## 2020-03-17 DIAGNOSIS — I340 Nonrheumatic mitral (valve) insufficiency: Secondary | ICD-10-CM | POA: Diagnosis not present

## 2020-03-17 DIAGNOSIS — N181 Chronic kidney disease, stage 1: Secondary | ICD-10-CM | POA: Diagnosis not present

## 2020-06-22 ENCOUNTER — Encounter: Payer: Self-pay | Admitting: Internal Medicine

## 2020-06-22 ENCOUNTER — Other Ambulatory Visit: Payer: Self-pay

## 2020-06-22 ENCOUNTER — Ambulatory Visit: Payer: Medicare HMO | Admitting: Internal Medicine

## 2020-06-22 DIAGNOSIS — R0600 Dyspnea, unspecified: Secondary | ICD-10-CM

## 2020-06-22 DIAGNOSIS — R0609 Other forms of dyspnea: Secondary | ICD-10-CM

## 2020-06-22 DIAGNOSIS — G4733 Obstructive sleep apnea (adult) (pediatric): Secondary | ICD-10-CM | POA: Diagnosis not present

## 2020-06-22 DIAGNOSIS — Z6841 Body Mass Index (BMI) 40.0 and over, adult: Secondary | ICD-10-CM

## 2020-06-22 NOTE — Patient Instructions (Signed)
We can continue CPAP auto 10-15. Our goal is to wear it all night, every night and whenever you sleep. At a minimum, try to get at least 4 hours/ night on at least 6 nights/ week.   Please call if we can help

## 2020-06-22 NOTE — Progress Notes (Signed)
Patient ID: Gordon Madden, male    DOB: 08-24-1951, 69 y.o.   MRN: [id number]  HPI male cigar smoker followed for OSA, insomnia, rhinitis, complicated by DM, HBP, CAD/MI, CVA/stroke NPSG 07/25/2010 AHI 14.9 per hour Office Spirometry 05/15/17-moderate restriction of exhaled volume and at least mild obstruction. FVC 2.66/55%, FEV1 2.07/58%, ratio 0.78, FEF 25-75% 1.91/69% ------------------------------------------------------------------------------------------------   06/23/2019- 69 year old male former smoker followed for OSA, insomnia, rhinitis, complicated by DM2, HBP, CAD/MI, CVA/stroke CPAP  Auto 10-15/Advanced Download compliance 100%, AHI 0.1/ hr Body weight today 348 lbs -----OSA on CPAP, dme: [employer]; no complaints Machine is 71 yrs old and working well for him. Needs new supplies. Nasal pillow mask. CXR 05/15/17-  IMPRESSION: Cardiomegaly with mild pulmonary interstitial prominence. Mild CHF cannot be excluded. No new cardiac issues.   06/22/20- 69 year old male former smoker followed for OSA, insomnia, rhinitis, complicated by DM2, HBP, CAD/MI/ CM EF 36%, , CVA/stroke CPAP  Auto 10-15/Advanced Download compliance 67%, AHI 0.1/ hr Body weight today - 334 lbs ------Patient is here for yearly cpap follow. Feels good overall Had 2 Moderna Covax Heat and humidity increase his DOE, but denies acute respiratory symptoms. Uses [employer] for long drives. Feels sleepiness is controlled by CPAP. Continues following with cardiology. Weight up with Covid precautions.   Review of Systems-see HPI   + = positive Constitutional:   No-   weight loss, night sweats, fevers, chills, fatigue, lassitude. HEENT:   No-  headaches, difficulty swallowing, tooth/dental problems, sore throat,      No-sneezing, itching, ear ache, nasal congestion, post nasal drip,  CV:  +chest pain, orthopnea, PND, swelling in lower extremities, anasarca, dizziness, palpitations Resp: +  shortness of breath with exertion or  at rest.              No-   productive cough,  No non-productive cough,  No-  coughing up of blood.              No-   change in color of mucus.  No- wheezing.   Skin: No-   rash or lesions. GI:  No-   heartburn, indigestion, abdominal pain, +nausea,  GU:  MS:  +joint pain or swelling. . Neuro- nothing unusual  Psych:  No- change in mood or affect. No depression or anxiety.  No memory loss.  Objective:   Physical Exam General- Alert, Oriented, Affect-appropriate, Distress- none acute;  + Morbidly obese Skin- + cool, clammy Lymphadenopathy- none Head- atraumatic            Eyes- Gross vision intact, PERRLA, conjunctivae clear secretions            Ears- Hearing, canals normal            Nose- Clear, Septal dev- not apparent from anterior., mucus, polyps, erosion, perforation             Throat- Mallampati III , mucosa clear , drainage- none, tonsils- atrophic Neck- flexible , trachea midline, no stridor , thyroid nl, carotid no bruit Chest - symmetrical excursion , unlabored           Heart/CV- RRR , no murmur , no gallop  , no rub, nl s1 s2                           - JVD- none , edema- none, stasis changes+, varices- none           Lung- clear to P&A, wheeze- none,  cough- none , dullness-none, rub- none           Chest wall-  Abd-  Br/ Gen/ Rectal- Not done, not indicated Extrem- cyanosis- none, clubbing, none, atrophy- none, strength- nl, + surgical scar left knee Neuro- grossly intact to observation

## 2020-06-26 IMAGING — MR MRI LUMBAR SPINE WITHOUT CONTRAST
4 of 5 series · 24 of 48 positions shown · non-contrast
Comparison: None available.

CLINICAL DATA: Initial evaluation for worsen lower back pain
extending into the right lower extremity for 3-4 months.

EXAM:
MRI LUMBAR SPINE WITHOUT CONTRAST
TECHNIQUE: Multiplanar, multisequence MR imaging of the lumbar spine was
performed. No intravenous contrast was administered.

[Series 4: T2 post-contrast · sagittal · 4.0mm · 0.55mm/px · 6 of 14 slices shown]
[im 1/14]
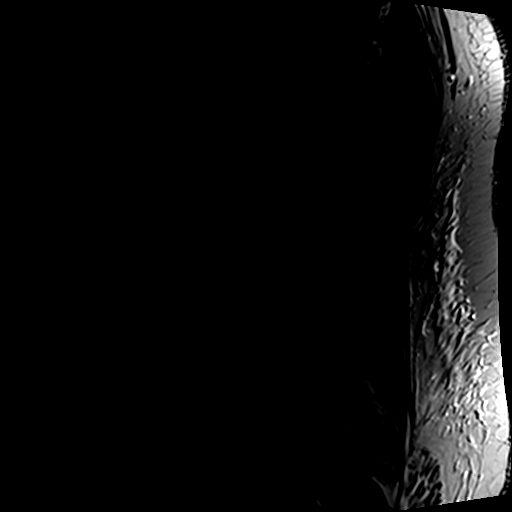
[im 3/14]
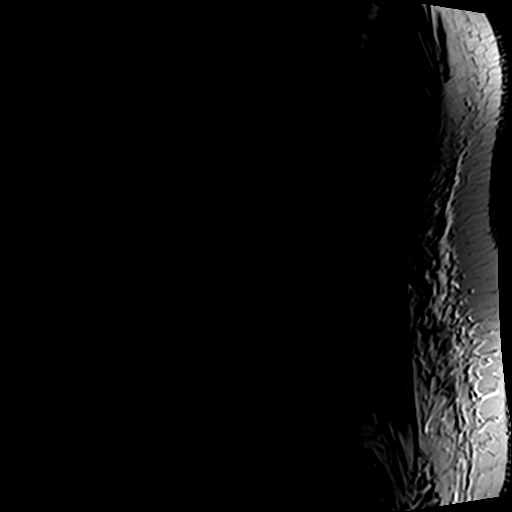
[im 6/14]
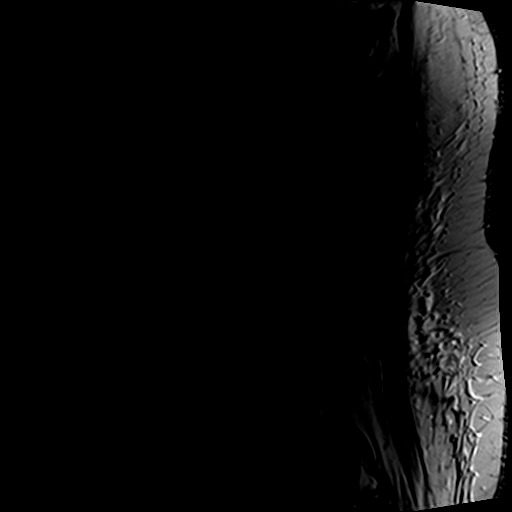
[im 8/14]
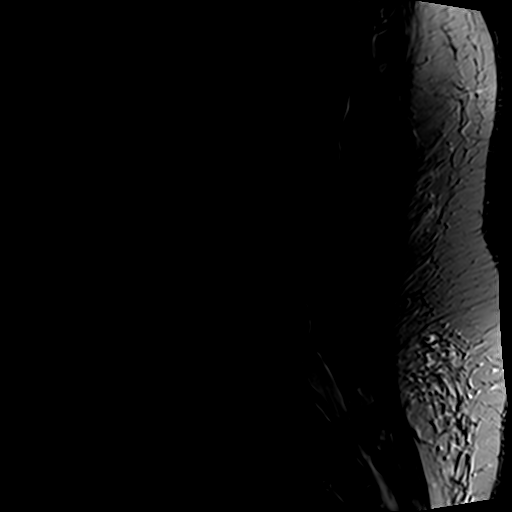
[im 11/14]
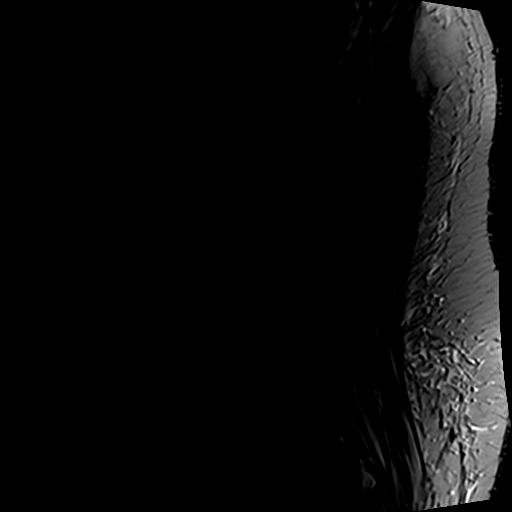
[im 14/14]
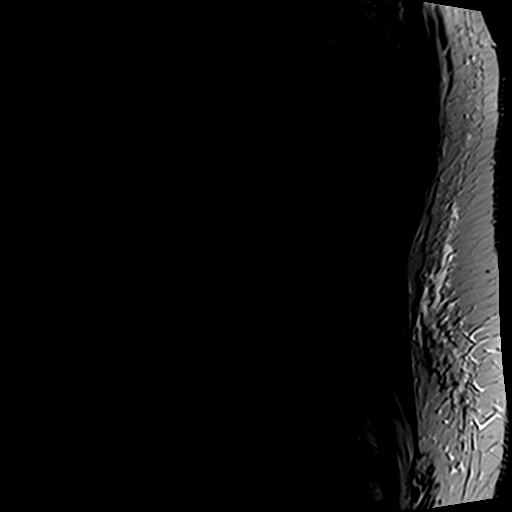

[Series 6: T1 · sagittal · 4.0mm · 0.55mm/px · 6 of 14 slices shown (1 of 2)]
[im 1/14]
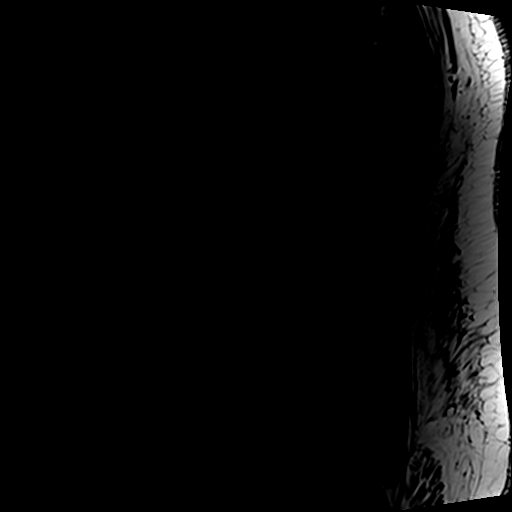
[im 3/14]
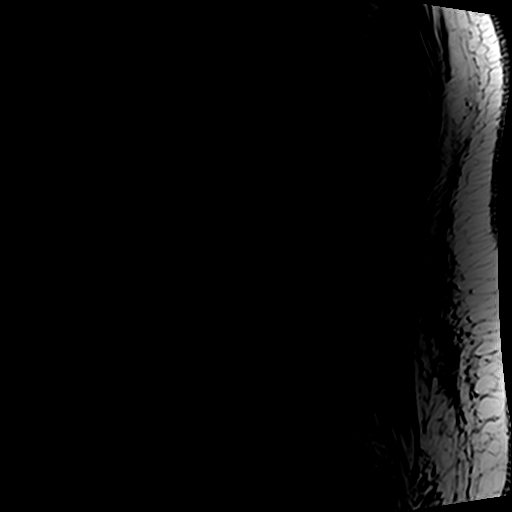
[im 6/14]
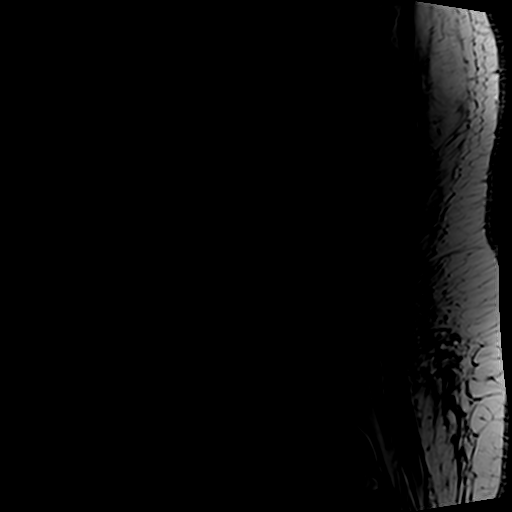
[im 8/14]
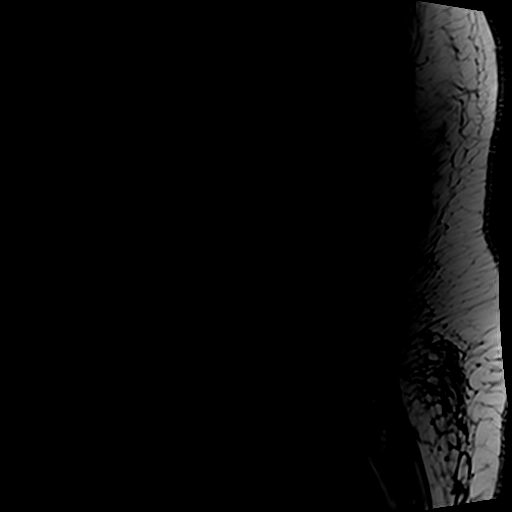
[im 11/14]
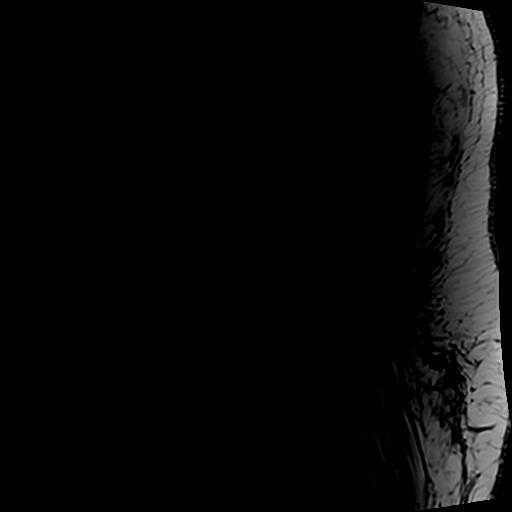
[im 14/14]
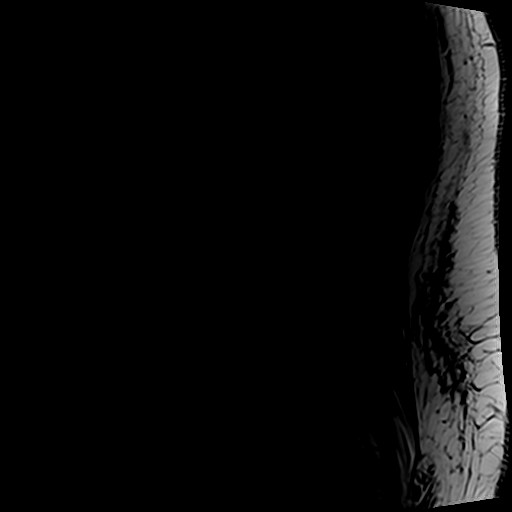

[Series 7: T1 · axial · 4.0mm · 0.35mm/px · z∈[-86,+76]mm · 3 of 38 slices shown (2 of 2)]
[im 6/38]
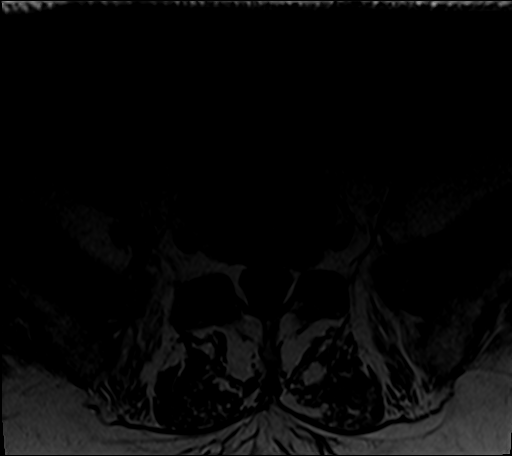
[im 19/38]
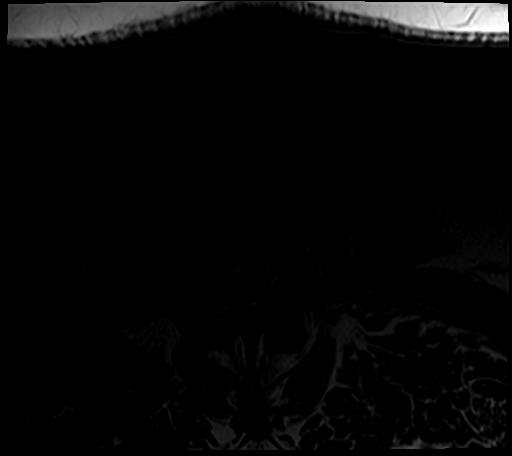
[im 32/38]
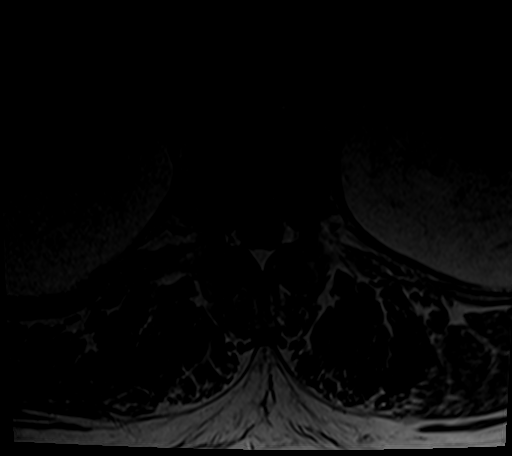

[Series 8: T2 · axial · 4.0mm · 0.70mm/px · z∈[-111,+123]mm · 9 of 38 slices shown]
[im 1/38]
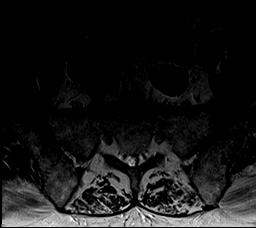
[im 6/38]
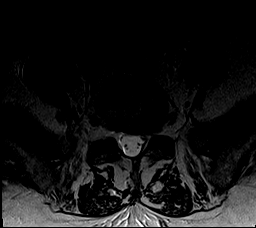
[im 11/38]
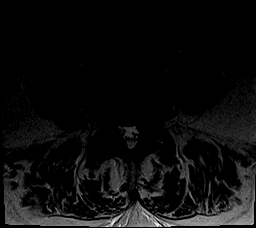
[im 16/38]
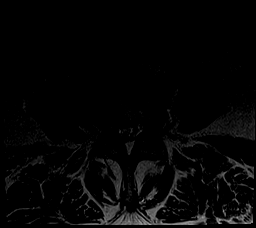
[im 19/38]
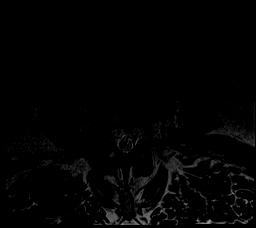
[im 22/38]
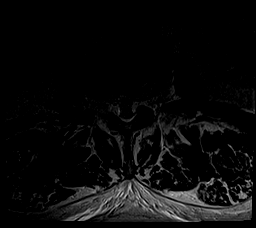
[im 27/38]
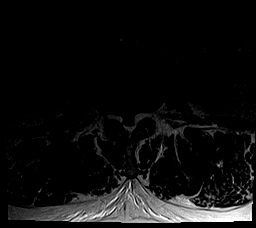
[im 32/38]
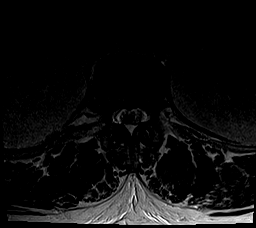
[im 38/38]
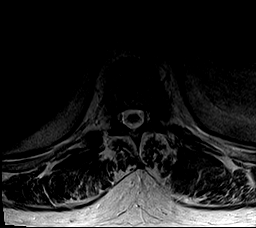

[24 of 48 positions shown; findings below may reference images not displayed]

FINDINGS: Segmentation: Transitional lumbosacral anatomy with partial
sacralization of the L5 vertebral body.

Alignment: Trace 2 mm retrolisthesis of L1 on L2 and L2 on L3.
Alignment otherwise normal with preservation of the normal lumbar
lordosis.

Vertebrae: Vertebral body heights maintained without evidence for
acute or chronic fracture. Bone marrow signal intensity diffusely
heterogeneous but within normal limits. No discrete or worrisome
osseous lesions. No abnormal marrow edema.

Conus medullaris and cauda equina: Conus extends to the L1-2 level.
Conus and cauda equina appear normal.

Paraspinal and other soft tissues: Paraspinous soft tissues
demonstrate no acute finding. Note made of a 15 mm T2 hyperintense
simple left renal cyst. Partially visualized visceral structures
otherwise grossly unremarkable on this motion degraded exam.

Disc levels:

T10-11: Seen only on sagittal projection. Chronic intervertebral
disc space narrowing with diffuse disc bulge and disc desiccation.
Bilateral facet hypertrophy. Thecal sac remains patent. At least
mild bilateral foraminal narrowing.

T11-12: Chronic intervertebral disc space narrowing with disc
desiccation and diffuse disc bulge. Bilateral facet hypertrophy,
greater on the right. No spinal stenosis. Mild bilateral foraminal
narrowing.

T12-L1: Diffuse disc bulge with disc desiccation. Epidural
lipomatosis. Thecal sac remains patent. No foraminal stenosis.

L1-2: Circumferential disc bulge with disc desiccation and
intervertebral disc space narrowing. Reactive endplate changes with
marginal endplate osteophytic spurring. Epidural lipomatosis.
Resultant mild spinal stenosis, largely due to prominent epidural
fat. Foramina remain patent.

L2-3: Circumferential disc bulge with disc desiccation and reactive
endplate changes. Resultant disc osteophyte, asymmetric to the left.
Mild facet hypertrophy. Epidural lipomatosis. Resultant mild to
moderate canal with left lateral recess narrowing. Mild left L2
foraminal stenosis.

L3-4: Degenerative intervertebral disc space narrowing with diffuse
disc bulge and disc desiccation. Mild reactive endplate changes.
Moderate facet and ligament flavum hypertrophy. Epidural
lipomatosis. Resultant moderate canal with right lateral recess
stenosis. Moderate right with mild left L3 foraminal stenosis.

L4-5: Diffuse disc bulge with disc desiccation and intervertebral
disc space narrowing. Mild reactive endplate changes. Moderate facet
hypertrophy. Resultant mild canal with left lateral recess
narrowing. Mild to moderate bilateral L4 foraminal stenosis.

L5-S1: Chronic intervertebral disc space narrowing with disc
desiccation and mild diffuse disc bulge. Disc bulging asymmetric to
the left. Superimposed reactive endplate changes with marginal
endplate osteophytic spurring. Moderate right with mild left facet
hypertrophy. Epidural lipomatosis. Broad-based left subarticular
disc osteophyte encroaches upon the left lateral recess, closely
approximating the descending left S1 nerve root (series 7, image
34). No frank neural impingement. Foramina remain patent.
IMPRESSION: 1. Moderate multilevel degenerative spondylolysis with resultant
mild to moderate diffuse spinal stenosis at L1-2 through L4-5 as
above, most pronounced at the L3-4 level.
2. Multifactorial degenerative changes with resultant mild to
moderate left L2 with bilateral L3 and L4 foraminal stenosis.
3. Broad based left subarticular disc osteophyte at L5-S1, closely
approximating and potentially affecting the descending left S1 nerve
root.
4. Diffuse epidural lipomatosis throughout the lumbar spine.

## 2020-06-26 NOTE — Assessment & Plan Note (Signed)
Obesity and deconditioning are more important than smoking history. Likely cardiac impact on exercise tolerance as well.

## 2020-06-26 NOTE — Assessment & Plan Note (Signed)
Has not been able to manage diet and exercise sufficiently. He understands importance

## 2020-06-26 NOTE — Assessment & Plan Note (Signed)
Benefits from CPAP. Skipped when out of town. Emphasized importance of good compliance with his heart conditions. Plan- continue auto 8-12

## 2020-07-06 DIAGNOSIS — G4733 Obstructive sleep apnea (adult) (pediatric): Secondary | ICD-10-CM | POA: Diagnosis not present

## 2020-07-06 DIAGNOSIS — G4710 Hypersomnia, unspecified: Secondary | ICD-10-CM | POA: Diagnosis not present

## 2020-07-19 DIAGNOSIS — F330 Major depressive disorder, recurrent, mild: Secondary | ICD-10-CM | POA: Diagnosis not present

## 2020-07-25 DIAGNOSIS — F330 Major depressive disorder, recurrent, mild: Secondary | ICD-10-CM | POA: Diagnosis not present

## 2020-08-08 DIAGNOSIS — F330 Major depressive disorder, recurrent, mild: Secondary | ICD-10-CM | POA: Diagnosis not present

## 2020-08-15 DIAGNOSIS — F330 Major depressive disorder, recurrent, mild: Secondary | ICD-10-CM | POA: Diagnosis not present

## 2020-08-22 DIAGNOSIS — F330 Major depressive disorder, recurrent, mild: Secondary | ICD-10-CM | POA: Diagnosis not present

## 2020-08-29 DIAGNOSIS — F330 Major depressive disorder, recurrent, mild: Secondary | ICD-10-CM | POA: Diagnosis not present

## 2020-09-05 DIAGNOSIS — F330 Major depressive disorder, recurrent, mild: Secondary | ICD-10-CM | POA: Diagnosis not present

## 2020-09-12 DIAGNOSIS — F330 Major depressive disorder, recurrent, mild: Secondary | ICD-10-CM | POA: Diagnosis not present

## 2020-09-18 NOTE — Progress Notes (Signed)
Cardiology Office Note   Date:  09/19/2020   ID:  Gordon Madden, DOB August 31, 1951, MRN [id number]  PCP:  Kelton Pillar, MD    No chief complaint on file.  CAD  Wt Readings from Last 3 Encounters:  09/19/20 (!) 318 lb 9.6 oz (144.5 kg)  06/22/20 (!) 334 lb (151.5 kg)  08/28/19 (!) 337 lb 12.8 oz (153.2 kg)       History of Present Illness: Gordon Madden is a 69 y.o. male   with history of CAD,Status post MI in 2004 and found to have a totally occluded RCA at [facility] in [place]. Last stress Myoview 05/2014 low risk with large inferior lateral scar and minimal peri-infarct ischemia EF 35% with inferolateral akinesis/mild dyskinesis. Felt to be low risk. 2-D echo 05/2014 EF 40-40%.chronic systolic CHF with EF 65-68%, diabetes mellitus, CVA, gout, HTN, HLD, OSA, BPH, morbid obesity, chronic DOE, chronic thrombocytopenia, arthritis, mildly dilated aorta.  Patient sawDayna Dunn/PA-C 07/10/17 for surgical clearance before undergoing right knee surgery. He was having worsening shortness of breath. 2-D echo shows LVEF 35-40% with global hypokinesis and grade 1 DD-worse than before. Nuclear stress test 07/18/17 LVEF 36% with fixed large severe basal inferior and basal to mid inferior lateral defect but no ischemia.Ireviewed and felt he could proceed with surgery and to avoid excessive IV fluids. 6/18BNP was normal.  He had right knee surgery and tolerated it well, in 9/18.  Nosebleed in 2/19 led to aspirin being stopped. He continues to take Plavix.No further bleeding issues since that time.   He had some procedures on ingrown toe nails. It oozed for several months, but resolved. Dr. [name] did this procedure.   Since the last visit, he has had some right sided facial and scalp tenderness in 9/21.  Improved in the past few days, but not gone.  Wife looked online and was concerned that it could be an arterial problem.  He has lost weight.  He has hired a  Transport planner and this helped.  Decreased portion size.  He is [facility] with the [facility].  Walking more as well. He does have some hip pain, but it is manageable.       Past Medical History:  Diagnosis Date  . Arthritis   . Ascending aorta dilatation (HCC)   . BPH (benign prostatic hyperplasia)   . CAD (coronary artery disease)    a. MI 2004 with occ RCA.  . Cardiomyopathy    EF 40%  . Chronic systolic CHF (congestive heart failure) ([place])   . Colon polyps   . CPAP (continuous positive airway pressure) dependence   . CVA (cerebral infarction)    Renal artery branch occlusion  . Diabetes mellitus, type 2 ([place])   . History of gout   . Hyperlipidemia   . Hypertension   . Idiopathic thrombocytopenia ([place])   . Morbid obesity ([place])   . Myocardial infarction ([place])   . OSA (obstructive sleep apnea)    07/25/10 AHI 14.9/hr  . Pneumonia   . PONV (postoperative nausea and vomiting)   . Primary localized osteoarthritis of left knee 06/07/2015  . Primary localized osteoarthritis of right knee 08/06/2017  . Rhinosinusitis   . Shortness of breath dyspnea   . Stroke ([place])   . Vision decreased     Past Surgical History:  Procedure Laterality Date  . CARDIAC CATHETERIZATION  2005   no stents  . CYST EXCISION     chest and  back  . HERNIA REPAIR     umbilical  . JOINT REPLACEMENT     Left 06/07/15  . KNEE CARTILAGE SURGERY     left  . KNEE SURGERY Right 08/06/2017  . MENISCUS REPAIR Left   . NASAL SINUS SURGERY    . PARTIAL KNEE ARTHROPLASTY Right 08/06/2017   Procedure: UNICOMPARTMENTAL KNEE;  Surgeon: [name], MD;  Location: [facility];  Service: Orthopedics;  Laterality: Right;  . SINUS IRRIGATION    . TOTAL KNEE ARTHROPLASTY Left 06/07/2015   Procedure: TOTAL KNEE ARTHROPLASTY;  Surgeon: [name], MD;  Location: [facility];  Service: Orthopedics;  Laterality: Left;  . UMBILICAL HERNIA REPAIR       Current Outpatient Medications  Medication Sig Dispense Refill   . carvedilol (COREG) 6.25 MG tablet Take 1 tablet (6.25 mg total) by mouth 2 (two) times daily. 180 tablet 3  . clopidogrel (PLAVIX) 75 MG tablet Take 75 mg by mouth daily.    . Coenzyme Q10 (CO Q 10 PO) Take 1 tablet by mouth daily.    . fexofenadine (ALLEGRA) 180 MG tablet Take 180 mg by mouth daily.    . folic acid (FOLVITE) 027 MCG tablet Take 400 mcg by mouth daily.    . furosemide (LASIX) 40 MG tablet TAKE 1 TABLET EVERY DAY 90 tablet 3  . ibuprofen (ADVIL,MOTRIN) 200 MG tablet Take 200 mg by mouth 3 (three) times daily.    . metFORMIN (GLUCOPHAGE) 500 MG tablet Take 1,000 mg by mouth 2 (two) times daily with a meal.     . pravastatin (PRAVACHOL) 40 MG tablet Take 40 mg by mouth daily.    . sacubitril-valsartan (ENTRESTO) 97-103 MG Take 1 tablet by mouth 2 (two) times daily. 180 tablet 3  . vitamin B-12 (CYANOCOBALAMIN) 1000 MCG tablet Take 1,000 mcg by mouth daily.     No current facility-administered medications for this visit.    Allergies:   Plavix [clopidogrel]    Social History:  The patient  reports that he quit smoking about 3 years ago. His smoking use included cigars and cigarettes. He has a 60.00 pack-year smoking history. He has never used smokeless tobacco. He reports current alcohol use. He reports that he does not use drugs.   Family History:  The patient's family history includes Hypertension in his father.    ROS:  Please see the history of present illness.   Otherwise, review of systems are positive for hip pain.   All other systems are reviewed and negative.    PHYSICAL EXAM: VS:  BP 120/78   Pulse 70   Ht 5' 11.5" (1.816 m)   Wt (!) 318 lb 9.6 oz (144.5 kg)   SpO2 95%   BMI 43.82 kg/m  , BMI Body mass index is 43.82 kg/m. GEN: Well nourished, well developed, in no acute distress  HEENT: normal  Neck: no JVD, carotid bruits, or masses Cardiac: RRR; no murmurs, rubs, or gallops,no edema  Respiratory:  clear to auscultation bilaterally, normal work of  breathing GI: soft, nontender, nondistended, + BS MS: no deformity or atrophy  Skin: warm and dry, no rash Neuro:  Strength and sensation are intact Psych: euthymic mood, full affect   EKG:   The ekg ordered today demonstrates NSR, RBBB   Recent Labs: No results found for requested labs within last 8760 hours.   Lipid Panel No results found for: CHOL, TRIG, HDL, CHOLHDL, VLDL, LDLCALC, LDLDIRECT   Other studies Reviewed: Additional studies/ records that  were reviewed today with results demonstrating: labs reviewed.   ASSESSMENT AND PLAN:  1. CAD: No angina. Continue aggressive secondary prevention.  Healthy diet recommended.  I congratulated him on his weight loss.  2. Chronic systolic heart failure: Entresto and Coreg.  Worse at th etime of the donut hole.  EF 40% in 2018.  Using Lasix as needed- only takes it if he is at home.  3. HTN: The current medical regimen is effective;  continue present plan and medications. 4. Tobacco abuse: 1 cigar/day.  He has Limited this.  5. Mitral regurgitation: No CHF sx. No volume overload.  6. Hyperlipidemia: LDL 65 in 4/21.  Continue pravastatin.  7. DM: 6.3 A1C in 4/21. Metformin- some diarrhea.   8. Consider carotid DOppler if scalp sx don't resolve.  Mild carotid disease in 2011.    Current medicines are reviewed at length with the patient today.  The patient concerns regarding his medicines were addressed.  The following changes have been made:  No change  Labs/ tests ordered today include:  No orders of the defined types were placed in this encounter.   Recommend 150 minutes/week of aerobic exercise Low fat, low carb, high fiber diet recommended  Disposition:   FU in 1 year   Signed, Larae Grooms, MD  09/19/2020 9:42 AM    [facility] [place], [place], [place]  [address] Phone: [phone]; Fax: [phone]

## 2020-09-19 ENCOUNTER — Other Ambulatory Visit: Payer: Self-pay

## 2020-09-19 ENCOUNTER — Ambulatory Visit: Payer: Medicare HMO | Admitting: Interventional Cardiology

## 2020-09-19 ENCOUNTER — Encounter: Payer: Self-pay | Admitting: Interventional Cardiology

## 2020-09-19 VITALS — BP 120/78 | HR 70 | Ht 71.5 in | Wt 318.6 lb

## 2020-09-19 DIAGNOSIS — I10 Essential (primary) hypertension: Secondary | ICD-10-CM | POA: Diagnosis not present

## 2020-09-19 DIAGNOSIS — E782 Mixed hyperlipidemia: Secondary | ICD-10-CM | POA: Diagnosis not present

## 2020-09-19 DIAGNOSIS — I340 Nonrheumatic mitral (valve) insufficiency: Secondary | ICD-10-CM | POA: Diagnosis not present

## 2020-09-19 DIAGNOSIS — I5022 Chronic systolic (congestive) heart failure: Secondary | ICD-10-CM | POA: Diagnosis not present

## 2020-09-19 DIAGNOSIS — E1159 Type 2 diabetes mellitus with other circulatory complications: Secondary | ICD-10-CM | POA: Diagnosis not present

## 2020-09-19 DIAGNOSIS — F330 Major depressive disorder, recurrent, mild: Secondary | ICD-10-CM | POA: Diagnosis not present

## 2020-09-19 DIAGNOSIS — I25118 Atherosclerotic heart disease of native coronary artery with other forms of angina pectoris: Secondary | ICD-10-CM

## 2020-09-19 DIAGNOSIS — F17200 Nicotine dependence, unspecified, uncomplicated: Secondary | ICD-10-CM

## 2020-09-19 MED ORDER — ENTRESTO 97-103 MG PO TABS: 1.0000 | ORAL_TABLET | Freq: Two times a day (BID) | ORAL | 3 refills | Status: DC

## 2020-09-19 NOTE — Patient Instructions (Signed)

## 2020-09-26 DIAGNOSIS — F330 Major depressive disorder, recurrent, mild: Secondary | ICD-10-CM | POA: Diagnosis not present

## 2020-10-03 DIAGNOSIS — F330 Major depressive disorder, recurrent, mild: Secondary | ICD-10-CM | POA: Diagnosis not present

## 2020-10-10 ENCOUNTER — Other Ambulatory Visit: Payer: Self-pay | Admitting: Family Medicine

## 2020-10-10 DIAGNOSIS — I340 Nonrheumatic mitral (valve) insufficiency: Secondary | ICD-10-CM | POA: Diagnosis not present

## 2020-10-10 DIAGNOSIS — I429 Cardiomyopathy, unspecified: Secondary | ICD-10-CM | POA: Diagnosis not present

## 2020-10-10 DIAGNOSIS — R1011 Right upper quadrant pain: Secondary | ICD-10-CM | POA: Diagnosis not present

## 2020-10-10 DIAGNOSIS — E1121 Type 2 diabetes mellitus with diabetic nephropathy: Secondary | ICD-10-CM | POA: Diagnosis not present

## 2020-10-10 DIAGNOSIS — F330 Major depressive disorder, recurrent, mild: Secondary | ICD-10-CM | POA: Diagnosis not present

## 2020-10-10 DIAGNOSIS — Z125 Encounter for screening for malignant neoplasm of prostate: Secondary | ICD-10-CM | POA: Diagnosis not present

## 2020-10-10 DIAGNOSIS — Z1389 Encounter for screening for other disorder: Secondary | ICD-10-CM | POA: Diagnosis not present

## 2020-10-10 DIAGNOSIS — E782 Mixed hyperlipidemia: Secondary | ICD-10-CM | POA: Diagnosis not present

## 2020-10-10 DIAGNOSIS — I119 Hypertensive heart disease without heart failure: Secondary | ICD-10-CM | POA: Diagnosis not present

## 2020-10-10 DIAGNOSIS — D696 Thrombocytopenia, unspecified: Secondary | ICD-10-CM | POA: Diagnosis not present

## 2020-10-10 DIAGNOSIS — Z0000 Encounter for general adult medical examination without abnormal findings: Secondary | ICD-10-CM | POA: Diagnosis not present

## 2020-10-10 DIAGNOSIS — I5022 Chronic systolic (congestive) heart failure: Secondary | ICD-10-CM | POA: Diagnosis not present

## 2020-10-10 DIAGNOSIS — I2510 Atherosclerotic heart disease of native coronary artery without angina pectoris: Secondary | ICD-10-CM | POA: Diagnosis not present

## 2020-10-24 ENCOUNTER — Encounter: Payer: Self-pay | Admitting: Podiatry

## 2020-10-24 ENCOUNTER — Other Ambulatory Visit: Payer: Self-pay

## 2020-10-24 ENCOUNTER — Ambulatory Visit: Payer: Medicare HMO | Admitting: Podiatry

## 2020-10-24 DIAGNOSIS — F330 Major depressive disorder, recurrent, mild: Secondary | ICD-10-CM | POA: Diagnosis not present

## 2020-10-24 DIAGNOSIS — L600 Ingrowing nail: Secondary | ICD-10-CM

## 2020-10-24 DIAGNOSIS — B351 Tinea unguium: Secondary | ICD-10-CM

## 2020-10-25 NOTE — Progress Notes (Signed)
Subjective:   Patient ID: Gordon Madden, male   DOB: 69 y.o.   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  Do not recommend removal currently but educated him on this possibility in the future and what the recovery would be with permanent correction

## 2020-10-27 ENCOUNTER — Ambulatory Visit
Admission: RE | Admit: 2020-10-27 | Discharge: 2020-10-27 | Disposition: A | Discharge status: Home / Self Care | Payer: Medicare HMO | Source: Ambulatory Visit | Attending: Family Medicine | Admitting: Family Medicine

## 2020-10-27 DIAGNOSIS — K8020 Calculus of gallbladder without cholecystitis without obstruction: Secondary | ICD-10-CM | POA: Diagnosis not present

## 2020-10-27 DIAGNOSIS — R1011 Right upper quadrant pain: Secondary | ICD-10-CM

## 2020-10-31 DIAGNOSIS — F330 Major depressive disorder, recurrent, mild: Secondary | ICD-10-CM | POA: Diagnosis not present

## 2020-11-03 DIAGNOSIS — H2181 Floppy iris syndrome: Secondary | ICD-10-CM | POA: Diagnosis not present

## 2020-11-03 DIAGNOSIS — H02831 Dermatochalasis of right upper eyelid: Secondary | ICD-10-CM | POA: Diagnosis not present

## 2020-11-03 DIAGNOSIS — E119 Type 2 diabetes mellitus without complications: Secondary | ICD-10-CM | POA: Diagnosis not present

## 2020-11-03 DIAGNOSIS — H34231 Retinal artery branch occlusion, right eye: Secondary | ICD-10-CM | POA: Diagnosis not present

## 2020-11-03 DIAGNOSIS — H5703 Miosis: Secondary | ICD-10-CM | POA: Diagnosis not present

## 2020-11-03 DIAGNOSIS — H2513 Age-related nuclear cataract, bilateral: Secondary | ICD-10-CM | POA: Diagnosis not present

## 2020-11-03 DIAGNOSIS — H02834 Dermatochalasis of left upper eyelid: Secondary | ICD-10-CM | POA: Diagnosis not present

## 2020-11-03 DIAGNOSIS — H04123 Dry eye syndrome of bilateral lacrimal glands: Secondary | ICD-10-CM | POA: Diagnosis not present

## 2020-11-07 DIAGNOSIS — F330 Major depressive disorder, recurrent, mild: Secondary | ICD-10-CM | POA: Diagnosis not present

## 2020-11-10 ENCOUNTER — Other Ambulatory Visit: Payer: Self-pay | Admitting: Interventional Cardiology

## 2020-11-14 DIAGNOSIS — F330 Major depressive disorder, recurrent, mild: Secondary | ICD-10-CM | POA: Diagnosis not present

## 2020-11-28 DIAGNOSIS — F330 Major depressive disorder, recurrent, mild: Secondary | ICD-10-CM | POA: Diagnosis not present

## 2020-12-05 DIAGNOSIS — F330 Major depressive disorder, recurrent, mild: Secondary | ICD-10-CM | POA: Diagnosis not present

## 2020-12-12 DIAGNOSIS — F330 Major depressive disorder, recurrent, mild: Secondary | ICD-10-CM | POA: Diagnosis not present

## 2020-12-19 DIAGNOSIS — F330 Major depressive disorder, recurrent, mild: Secondary | ICD-10-CM | POA: Diagnosis not present

## 2020-12-26 DIAGNOSIS — F330 Major depressive disorder, recurrent, mild: Secondary | ICD-10-CM | POA: Diagnosis not present

## 2021-01-02 DIAGNOSIS — F330 Major depressive disorder, recurrent, mild: Secondary | ICD-10-CM | POA: Diagnosis not present

## 2021-01-04 ENCOUNTER — Ambulatory Visit: Payer: Self-pay | Admitting: Surgery

## 2021-01-04 ENCOUNTER — Telehealth: Payer: Self-pay

## 2021-01-04 DIAGNOSIS — K8020 Calculus of gallbladder without cholecystitis without obstruction: Secondary | ICD-10-CM | POA: Diagnosis not present

## 2021-01-04 NOTE — Telephone Encounter (Signed)
Dr Irish Lack we need clearance to hold Plavix in this patient pre op lap chole. He has a known remote RCA occlusion.  Myoview 2018 intermediate but patient was stable.  Please respond to CV DIV PRE OP  Thanks  [name] PA-C 01/04/2021 2:39 PM

## 2021-01-04 NOTE — Telephone Encounter (Signed)
OK to hold Plavix 5 days prior to surgery  JV

## 2021-01-04 NOTE — H&P (Signed)
History of Present Illness (Gordon Preisler L. Zenia Resides MD; 01/04/2021 9:35 AM) The patient is a 70 year old male who presents for evaluation of gall stones.CC: abdominal pain  HPI: Gordon Madden is a 70 yo male who was referred for gallstones. He has been having RUQ abdominal pain after meals for several years. He usually has these episodes 1-2 times per month, about 30-40 minutes after eating. Occasionally he has a very severe episode. He has never noticed jaundice or had fevers/chills during the episodes. The pain is located in the RUQ and radiates to the right flank and back. His PCP referred him for a RUQ [place] on 10/27/20, which showed a contracted gallbladder with gallstones, as well as fatty liver. He had LFTs on 10/10/20 which were normal. Platelet count at that time was 78; he reports a history of chronic thrombocytopenia for many years, and has tolerated multiple orthopedic operations without need for blood transfusion. On review of his labs in Epic, his baseline platelet count runs in the 70s-80s over the past several years. His only prior abdominal surgery is an umbilical hernia repair about 20 years ago. He reports mesh was placed.  The patient has a history of CAD and had an MI in 2004. Heart cath at that time showed an occluded RCA. He is managed medically with plavix and sees his cardiologist annually, last on 09/19/20. His last echo and stress test in 2018 showed a mildly reduced EF at 35-40%. He was previously cleared for knee surgery in 2018, which he tolerated. He reports that he has shortness of breath on exertion but is able to climb 2-3 flights of stairs.  PMH: CAD, HTN, OSA, T2DM, gout, BPH  PSH: knee replacement, umbilical hernia repair, sinus surgery  FHx: HTN (father)  Social: Occasional EtOH (1 glass of wine per week), former smoker (quit 2018)    Past Surgical History Gordon Madden, CMA; 01/04/2021 8:54 AM) Colon Polyp Removal - Colonoscopy  Knee Surgery  Bilateral. Oral Surgery    Diagnostic Studies History Gordon Madden, CMA; 01/04/2021 8:54 AM) Colonoscopy  1-5 years ago  Allergies (Gordon Madden, CMA; 01/04/2021 8:55 AM) No Known Drug Allergies  [01/04/2021]: No Known Allergies  [01/04/2021]: Allergies Reconciled   Medication History (Gordon Madden, CMA; 01/04/2021 8:57 AM) Carvedilol (6.25MG  Tablet, Oral) Active. Entresto (97-103MG  Tablet, Oral) Active. Pravastatin Sodium (40MG  Tablet, Oral) Active. metFORMIN HCl (500MG  Tablet, Oral) Active. Furosemide (40MG  Tablet, Oral) Active. Medications Reconciled  Social History Gordon Madden, CMA; 01/04/2021 8:54 AM) Alcohol use  Occasional alcohol use. Caffeine use  Coffee. No drug use  Tobacco use  Current every day smoker.  Family History Gordon Madden, Gordon Madden; 01/04/2021 8:54 AM) First Degree Relatives  No pertinent family history   Other Problems Gordon Madden, CMA; 01/04/2021 8:54 AM) Arthritis  Cerebrovascular Accident  Congestive Heart Failure  Diabetes Mellitus  Enlarged Prostate  High blood pressure  Myocardial infarction  Sleep Apnea  Umbilical Hernia Repair     Review of Systems (Gordon Madden CMA; 01/04/2021 8:54 AM) General Not Present- Appetite Loss, Chills, Fatigue, Fever, Night Sweats, Weight Gain and Weight Loss. Skin Not Present- Change in Wart/Mole, Dryness, Hives, Jaundice, New Lesions, Non-Healing Wounds, Rash and Ulcer. HEENT Present- Hearing Loss and Wears glasses/contact lenses. Not Present- Earache, Hoarseness, Nose Bleed, Oral Ulcers, Ringing in the Ears, Seasonal Allergies, Sinus Pain, Sore Throat, Visual Disturbances and Yellow Eyes. Respiratory Not Present- Bloody sputum, Chronic Cough, Difficulty Breathing, Snoring and Wheezing. Breast Not Present- Breast Mass, Breast Pain, Nipple Discharge and Skin  Changes. Cardiovascular Present- Shortness of Breath. Not Present- Chest Pain, Difficulty  Breathing Lying Down, Leg Cramps, Palpitations, Rapid Heart Rate and Swelling of Extremities. Gastrointestinal Not Present- Abdominal Pain, Bloating, Bloody Stool, Change in Bowel Habits, Chronic diarrhea, Constipation, Difficulty Swallowing, Excessive gas, Gets full quickly at meals, Hemorrhoids, Indigestion, Nausea, Rectal Pain and Vomiting. Male Genitourinary Present- Frequency and Urgency. Not Present- Blood in Urine, Change in Urinary Stream, Impotence, Nocturia, Painful Urination and Urine Leakage. Musculoskeletal Not Present- Back Pain, Joint Pain, Joint Stiffness, Muscle Pain, Muscle Weakness and Swelling of Extremities. Neurological Not Present- Decreased Memory, Fainting, Headaches, Numbness, Seizures, Tingling, Tremor, Trouble walking and Weakness. Psychiatric Not Present- Anxiety, Bipolar, Change in Sleep Pattern, Depression, Fearful and Frequent crying. Endocrine Not Present- Cold Intolerance, Excessive Hunger, Hair Changes, Heat Intolerance, Hot flashes and New Diabetes. Hematology Present- Blood Thinners and Easy Bruising. Not Present- Excessive bleeding, Gland problems, HIV and Persistent Infections.  Vitals (Gordon Madden CMA; 01/04/2021 8:57 AM) 01/04/2021 8:57 AM Weight: 315.5 lb Height: 71in Body Surface Area: 2.56 m Body Mass Index: 44 kg/m  Temp.: 98.45F  Pulse: 83 (Regular)  P.OX: 96% (Room air) BP: 122/80(Sitting, Left Arm, Standard)       Physical Exam (Gordon Gaultney L. Zenia Resides MD; 01/04/2021 9:35 AM) The physical exam findings are as follows: Note: Constitutional: No acute distress; conversant; no deformities Neuro: alert and oriented; cranial nerves grossly in tact; no focal deficits Eyes: Moist conjunctiva; anicteric sclerae; extraocular movements in tact Neck: Trachea midline Lungs: Normal respiratory effort; lungs clear to auscultation bilaterally; symmetric chest wall expansion CV: Regular rate and rhythm; no murmurs; no pitting edema GI: Abdomen  soft, nontender, nondistended; no masses or organomegaly; well-healed supraumbilical surgical scar MSK: Normal gait and station; no clubbing/cyanosis Psychiatric: Appropriate affect; alert and oriented 3    Assessment & Plan (Gordon Madden L. Zenia Resides MD; 01/04/2021 9:40 AM) SYMPTOMATIC CHOLELITHIASIS (K80.20) Story: 70 yo male with symptomatic cholelithiasis. His symptoms of postprandial RUQ pain are very typical of biliary colic. I reviewed his labs and RUQ [place], which shows multiple shadowing gallstones. I recommended laparoscopic cholecystectomy. The procedure was discussed in detail and the risks and benefits were reviewed, including bleeding, infection, and <0.5% risk of common bile duct injury. The patient is at slightly increased risk of bleeding and cardiac complications given his history, but his cardiovascular disease seems stable based on recent cardiology notes. His platelet count is at baseline and is above an acceptable threshold for surgery. I believe he is an acceptable risk surgical candidate and will request clearance from his cardiologist prior to scheduling surgery. I have counseled the patient on this and he agrees to proceed with surgery. Will request cardiac clearance and permission to hold plavix for 5 days prior to surgery. Patient will be contacted to schedule a surgery date after we have received clearance.  Gordon Birks, MD [facility] Surgery General, Hepatobiliary and Pancreatic Surgery 01/04/21 9:43 AM

## 2021-01-04 NOTE — H&P (View-Only) (Signed)
History of Present Illness (Jmichael Gille L. Zenia Resides MD; 01/04/2021 9:35 AM) The patient is a 70 year old male who presents for evaluation of gall stones.CC: abdominal pain  HPI: Mr. Livingstone is a 70 yo male who was referred for gallstones. He has been having RUQ abdominal pain after meals for several years. He usually has these episodes 1-2 times per month, about 30-40 minutes after eating. Occasionally he has a very severe episode. He has never noticed jaundice or had fevers/chills during the episodes. The pain is located in the RUQ and radiates to the right flank and back. His PCP referred him for a RUQ [place] on 10/27/20, which showed a contracted gallbladder with gallstones, as well as fatty liver. He had LFTs on 10/10/20 which were normal. Platelet count at that time was 78; he reports a history of chronic thrombocytopenia for many years, and has tolerated multiple orthopedic operations without need for blood transfusion. On review of his labs in Epic, his baseline platelet count runs in the 70s-80s over the past several years. His only prior abdominal surgery is an umbilical hernia repair about 20 years ago. He reports mesh was placed.  The patient has a history of CAD and had an MI in 2004. Heart cath at that time showed an occluded RCA. He is managed medically with plavix and sees his cardiologist annually, last on 09/19/20. His last echo and stress test in 2018 showed a mildly reduced EF at 35-40%. He was previously cleared for knee surgery in 2018, which he tolerated. He reports that he has shortness of breath on exertion but is able to climb 2-3 flights of stairs.  PMH: CAD, HTN, OSA, T2DM, gout, BPH  PSH: knee replacement, umbilical hernia repair, sinus surgery  FHx: HTN (father)  Social: Occasional EtOH (1 glass of wine per week), former smoker (quit 2018)    Past Surgical History Sherron Ales Marshall-McBride, CMA; 01/04/2021 8:54 AM) Colon Polyp Removal - Colonoscopy  Knee Surgery  Bilateral. Oral Surgery    Diagnostic Studies History Sherron Ales Marshall-McBride, CMA; 01/04/2021 8:54 AM) Colonoscopy  1-5 years ago  Allergies (Kheana Marshall-McBride, CMA; 01/04/2021 8:55 AM) No Known Drug Allergies  [01/04/2021]: No Known Allergies  [01/04/2021]: Allergies Reconciled   Medication History (Kheana Marshall-McBride, CMA; 01/04/2021 8:57 AM) Carvedilol (6.25MG  Tablet, Oral) Active. Entresto (97-103MG  Tablet, Oral) Active. Pravastatin Sodium (40MG  Tablet, Oral) Active. metFORMIN HCl (500MG  Tablet, Oral) Active. Furosemide (40MG  Tablet, Oral) Active. Medications Reconciled  Social History Hortencia Conradi, CMA; 01/04/2021 8:54 AM) Alcohol use  Occasional alcohol use. Caffeine use  Coffee. No drug use  Tobacco use  Current every day smoker.  Family History Hortencia Conradi, [place]; 01/04/2021 8:54 AM) First Degree Relatives  No pertinent family history   Other Problems Hortencia Conradi, CMA; 01/04/2021 8:54 AM) Arthritis  Cerebrovascular Accident  Congestive Heart Failure  Diabetes Mellitus  Enlarged Prostate  High blood pressure  Myocardial infarction  Sleep Apnea  Umbilical Hernia Repair     Review of Systems (Kheana Marshall-McBride CMA; 01/04/2021 8:54 AM) General Not Present- Appetite Loss, Chills, Fatigue, Fever, Night Sweats, Weight Gain and Weight Loss. Skin Not Present- Change in Wart/Mole, Dryness, Hives, Jaundice, New Lesions, Non-Healing Wounds, Rash and Ulcer. HEENT Present- Hearing Loss and Wears glasses/contact lenses. Not Present- Earache, Hoarseness, Nose Bleed, Oral Ulcers, Ringing in the Ears, Seasonal Allergies, Sinus Pain, Sore Throat, Visual Disturbances and Yellow Eyes. Respiratory Not Present- Bloody sputum, Chronic Cough, Difficulty Breathing, Snoring and Wheezing. Breast Not Present- Breast Mass, Breast Pain, Nipple Discharge and Skin  Changes. Cardiovascular Present- Shortness of Breath. Not Present- Chest Pain, Difficulty  Breathing Lying Down, Leg Cramps, Palpitations, Rapid Heart Rate and Swelling of Extremities. Gastrointestinal Not Present- Abdominal Pain, Bloating, Bloody Stool, Change in Bowel Habits, Chronic diarrhea, Constipation, Difficulty Swallowing, Excessive gas, Gets full quickly at meals, Hemorrhoids, Indigestion, Nausea, Rectal Pain and Vomiting. Male Genitourinary Present- Frequency and Urgency. Not Present- Blood in Urine, Change in Urinary Stream, Impotence, Nocturia, Painful Urination and Urine Leakage. Musculoskeletal Not Present- Back Pain, Joint Pain, Joint Stiffness, Muscle Pain, Muscle Weakness and Swelling of Extremities. Neurological Not Present- Decreased Memory, Fainting, Headaches, Numbness, Seizures, Tingling, Tremor, Trouble walking and Weakness. Psychiatric Not Present- Anxiety, Bipolar, Change in Sleep Pattern, Depression, Fearful and Frequent crying. Endocrine Not Present- Cold Intolerance, Excessive Hunger, Hair Changes, Heat Intolerance, Hot flashes and New Diabetes. Hematology Present- Blood Thinners and Easy Bruising. Not Present- Excessive bleeding, Gland problems, HIV and Persistent Infections.  Vitals (Kheana Marshall-McBride CMA; 01/04/2021 8:57 AM) 01/04/2021 8:57 AM Weight: 315.5 lb Height: 71in Body Surface Area: 2.56 m Body Mass Index: 44 kg/m  Temp.: 98.76F  Pulse: 83 (Regular)  P.OX: 96% (Room air) BP: 122/80(Sitting, Left Arm, Standard)       Physical Exam (Demontrez Rindfleisch L. Zenia Resides MD; 01/04/2021 9:35 AM) The physical exam findings are as follows: Note: Constitutional: No acute distress; conversant; no deformities Neuro: alert and oriented; cranial nerves grossly in tact; no focal deficits Eyes: Moist conjunctiva; anicteric sclerae; extraocular movements in tact Neck: Trachea midline Lungs: Normal respiratory effort; lungs clear to auscultation bilaterally; symmetric chest wall expansion CV: Regular rate and rhythm; no murmurs; no pitting edema GI: Abdomen  soft, nontender, nondistended; no masses or organomegaly; well-healed supraumbilical surgical scar MSK: Normal gait and station; no clubbing/cyanosis Psychiatric: Appropriate affect; alert and oriented 3    Assessment & Plan (Amar Keenum L. Zenia Resides MD; 01/04/2021 9:40 AM) SYMPTOMATIC CHOLELITHIASIS (K80.20) Story: 70 yo male with symptomatic cholelithiasis. His symptoms of postprandial RUQ pain are very typical of biliary colic. I reviewed his labs and RUQ [place], which shows multiple shadowing gallstones. I recommended laparoscopic cholecystectomy. The procedure was discussed in detail and the risks and benefits were reviewed, including bleeding, infection, and <0.5% risk of common bile duct injury. The patient is at slightly increased risk of bleeding and cardiac complications given his history, but his cardiovascular disease seems stable based on recent cardiology notes. His platelet count is at baseline and is above an acceptable threshold for surgery. I believe he is an acceptable risk surgical candidate and will request clearance from his cardiologist prior to scheduling surgery. I have counseled the patient on this and he agrees to proceed with surgery. Will request cardiac clearance and permission to hold plavix for 5 days prior to surgery. Patient will be contacted to schedule a surgery date after we have received clearance.  Michaelle Birks, MD [facility] Surgery General, Hepatobiliary and Pancreatic Surgery 01/04/21 9:43 AM

## 2021-01-04 NOTE — Telephone Encounter (Signed)
   Primary Cardiologist: Larae Grooms, MD  Chart reviewed and patient contacted today by phone as part of pre-operative protocol coverage. Given past medical history and time since last visit, based on ACC/AHA guidelines, Gordon Madden would be at acceptable risk for the planned procedure without further cardiovascular testing.   OK to hold Plavix 5 days pre op if needed.   The patient was advised that if he develops new symptoms prior to surgery to contact our office to arrange for a follow-up visit, and he verbalized understanding.  I will route this recommendation to the requesting party via Epic fax function and remove from pre-op pool.  Please call with questions.  [name], PA-C 01/04/2021, 3:56 PM

## 2021-01-04 NOTE — Telephone Encounter (Signed)
   [facility] Pre-operative Risk Assessment    HEARTCARE STAFF: - Please ensure there is not already an duplicate clearance open for this procedure. - Under Visit Info/Reason for Call, type in Other and utilize the format Clearance MM/DD/YY or Clearance TBD. Do not use dashes or single digits. - If request is for dental extraction, please clarify the # of teeth to be extracted.  Request for surgical clearance:  1. What type of surgery is being performed? LAPAROSCOPIC CHOLECYSTECTOMY   2. When is this surgery scheduled? TBD  3. What type of clearance is required (medical clearance vs. Pharmacy clearance to hold med vs. Both)? MEDICAL  4. Are there any medications that need to be held prior to surgery and how long? NONE   5. Practice name and name of physician performing surgery? [facility] 6. What is the office phone number? [phone]   7.   What is the office fax number? [phone], ATTN: CHANEL NOLAN  8.   Anesthesia type (None, local, MAC, general) ? GENERAL   Jacinta Shoe 01/04/2021, 2:25 PM  _________________________________________________________________   (provider comments below)

## 2021-01-09 DIAGNOSIS — F330 Major depressive disorder, recurrent, mild: Secondary | ICD-10-CM | POA: Diagnosis not present

## 2021-01-16 DIAGNOSIS — F330 Major depressive disorder, recurrent, mild: Secondary | ICD-10-CM | POA: Diagnosis not present

## 2021-01-30 ENCOUNTER — Other Ambulatory Visit (HOSPITAL_COMMUNITY)
Admission: RE | Admit: 2021-01-30 | Discharge: 2021-01-30 | Disposition: A | Discharge status: Home / Self Care | Payer: Medicare HMO | Source: Ambulatory Visit | Attending: Surgery | Admitting: Surgery

## 2021-01-30 DIAGNOSIS — Z20822 Contact with and (suspected) exposure to covid-19: Secondary | ICD-10-CM | POA: Insufficient documentation

## 2021-01-30 DIAGNOSIS — F330 Major depressive disorder, recurrent, mild: Secondary | ICD-10-CM | POA: Diagnosis not present

## 2021-01-30 DIAGNOSIS — Z01812 Encounter for preprocedural laboratory examination: Secondary | ICD-10-CM | POA: Insufficient documentation

## 2021-01-30 LAB — SARS CORONAVIRUS 2 (TAT 6-24 HRS): SARS Coronavirus 2: NEGATIVE

## 2021-02-01 ENCOUNTER — Encounter (HOSPITAL_COMMUNITY): Payer: Self-pay | Admitting: Surgery

## 2021-02-01 NOTE — Progress Notes (Signed)
PCP:  Kelton Pillar Cardiologist: Larae Grooms Pulmonology: Baird Lyons  EKG: 09/19/20 CXR: 05/15/17 ECHO: 07/18/17 Stress Test: 07/18/17 Cardiac Cath: >15 years  Fasting Blood Sugar- does not check Checks Blood Sugar__0_ times a day  OSA/CPAP:  Yes, wears nightly.  ASA:  No Blood Thinners: Last dose Plavix 01/27/21  Anesthesia Review:  Yes, hx CAD, Mi, CHF  Patient denies shortness of breath, fever, cough, and chest pain at PAT appointment.  Patient verbalized understanding of instructions provided today at the PAT appointment.  Patient asked to review instructions at home and day of surgery.

## 2021-02-01 NOTE — Anesthesia Preprocedure Evaluation (Addendum)
Anesthesia Evaluation  Patient identified by MRN, date of birth, ID band Patient awake    Reviewed: Allergy & Precautions, NPO status , Patient's Chart, lab work & pertinent test results  History of Anesthesia Complications (+) PONV  Airway Mallampati: I  TM Distance: >3 FB     Dental  (+) Dental Advisory Given   Pulmonary sleep apnea and Continuous Positive Airway Pressure Ventilation , former smoker,  01/30/2021 SARS coronavirus NEG   breath sounds clear to auscultation       Cardiovascular hypertension, Pt. on medications and Pt. on home beta blockers + CAD, + Peripheral Vascular Disease and + DOE   Rhythm:Regular Rate:Normal  '18 Stress: EF 36% with basal/mid inferolateral and basal inferior hypokinesis. Fixed large, severe basal inferior and basal to mid inferolateral perfusion defect.  This suggests infarction without significant ischemia.   '18 ECHO: cavity size was moderately dilated. Wall  thickness was normal. Systolic function was moderately reduced. EF 35% to 40%.Global hypokinesis with regional variation inferiorly. grade 1 diastolic dysfunction, no significant valvular abnormalities   Neuro/Psych negative neurological ROS     GI/Hepatic negative GI ROS, Cholelithiasis   Endo/Other  diabetes (glu 128), Oral Hypoglycemic AgentsMorbid obesity  Renal/GU negative Renal ROS     Musculoskeletal  (+) Arthritis ,   Abdominal (+) + obese,   Peds  Hematology  (+) Blood dyscrasia (thrombocytopenia, plts 91k), ,   Anesthesia Other Findings   Reproductive/Obstetrics                           Anesthesia Physical Anesthesia Plan  ASA: III  Anesthesia Plan: General   Post-op Pain Management:    Induction: Intravenous  PONV Risk Score and Plan: 3 and Ondansetron, Dexamethasone and Treatment may vary due to age or medical condition  Airway Management Planned: Oral ETT  Additional  Equipment: None  Intra-op Plan:   Post-operative Plan: Extubation in OR  Informed Consent: I have reviewed the patients History and Physical, chart, labs and discussed the procedure including the risks, benefits and alternatives for the proposed anesthesia with the patient or authorized representative who has indicated his/her understanding and acceptance.     Dental advisory given  Plan Discussed with: CRNA and Surgeon  Anesthesia Plan Comments: (PAT note written 02/01/2021 by Myra Gianotti, PA-C. )      Anesthesia Quick Evaluation

## 2021-02-01 NOTE — Progress Notes (Signed)
Anesthesia Chart Review: SAME DAY WORK-UP   Case: [id number] Date/Time: 02/02/21 0915   Procedure: LAPAROSCOPIC CHOLECYSTECTOMY (N/A ) - ROOM 2 STARTING AT 09:30 AM FOR 90 MIN   Anesthesia type: General   Pre-op diagnosis: SYMPTOMATIC CHOLELITHIASIS   Location: [facility] OR ROOM 02 / [place] OR   Surgeons: [name], MD      DISCUSSION: Patient is a 70 year old male scheduled for the above procedure.  History includes former smoker (quit 11/26/16), post-operative N/V, CAD (MI 2004 with occluded RCA, [facility] in [place]), cardiomyopathy, chronic systolic CHF, HTN, HLD, DM2, CVA, OSA (CPAP), BPH, chronic idiopathic thrombocytopenia purpura (PLT count ~ 70-90K range), ascending aorta dilatation (not seen on echo 07/18/17), TKA (left 06/07/15, right 07/26/17).   Preoperative cardiology input outlined on 01/04/21 by [name], PA-C, "Given past medical history and time since last visit, based on ACC/AHA guidelines, Gordon Madden would be at acceptable risk for the planned procedure without further cardiovascular testing.   OK to hold Plavix 5 days pre op if needed...."  Last Plavix 01/27/2021.  2nd Moderna COVID-19 vaccine 01/13/20. 01/30/2021 presurgical COVID-19 test negative.  Anesthesia team to evaluate on the day of surgery.   VS:   Wt Readings from Last 3 Encounters:  09/19/20 (!) 144.5 kg  06/22/20 (!) 151.5 kg  08/28/19 (!) 153.2 kg   BP Readings from Last 3 Encounters:  09/19/20 120/78  06/22/20 124/78  08/28/19 122/74   Pulse Readings from Last 3 Encounters:  09/19/20 70  06/22/20 78  08/28/19 66    PROVIDERS: Kelton Pillar, MD is PCP  Larae Grooms, MD is cardiologist. Last visit 09/19/20. Baird Lyons, MD is pulmonologist. Last visit 06/26/20.  Last seen by hematology Mikey Bussing, NP/Mohamed, Julien Nordmann, MD on 07/30/17 prior to knee surgery. Stable PLT count at that time with known PLT count primarily ~ 70-90K range. Continued observation with as needed hematology  follow-up. He was felt okay to proceed with knee surgery if PLT count > 50K.    LABS: For day of surgery. As of 10/10/20 ([place]), A1c 6.4%, H/H 15.6/48.2, PLT 78K (74K 03/17/20), Cr 0.99, AST 16, ALT 12.   PFTs > 3 years ago. By pulmonary notes, "Office Spirometry 05/15/17-moderate restriction of exhaled volume and at least mild obstruction. FVC 2.66/55%, FEV1 2.07/58%, ratio 0.78, FEF 25-75% 1.91/69%"   IMAGES: [place] abd (RUQ) 10/27/20: IMPRESSION: 1.  Gallbladder contracted and filled with calculi. 2. Marked increase in liver echogenicity, a finding indicative of hepatic steatosis. While no focal liver lesions are evident on this study, it must be cautioned that the sensitivity of ultrasound for detection of focal liver lesions is diminished significantly in this circumstance.  Carotid arteriography/Vertebral artery angiograms 01/30/10: IMPRESSION  1. No angiographic evidence of occlusions, stenoses, dissections,  aneurysms or dural AV malformation noted.  2. Wide patency of the ophthalmic arteries with the presence of  ciliary blush bilaterally.    EKG: 09/19/20: NSR, incomplete RBBB. LAD. Cannot rule out inferior infarct (old).. Baseline wanderer.   CV: Nuclear stress test 07/18/17:  Nuclear stress EF: 36%.  There was no ST segment deviation noted during stress. 1. EF 36% with basal/mid inferolateral and basal inferior hypokinesis.  2. Fixed large, severe basal inferior and basal to mid inferolateral perfusion defect.  This suggests infarction without significant ischemia.  - Intermediate risk study due to low EF.    Echo 07/18/17: Study Conclusions  - Left ventricle: The cavity size was moderately dilated. Wall  thickness  was normal. Systolic function was moderately reduced.  The estimated ejection fraction was in the range of 35% to 40%.  Global hypokinesis with regional variation inferiorly. Doppler  parameters are consistent with abnormal left  ventricular  relaxation (grade 1 diastolic dysfunction). The E/e&' ratio is  between 8-15, suggesting indeterminate LV filling pressure.  Ejection fraction (MOD, 2-plane): 37%.  - Aortic valve: Trileaflet; mildly calcified leaflets. There was no  stenosis. There was no regurgitation.  - Mitral valve: Calcified annulus. Mildly thickened leaflets .  There was trivial regurgitation.  - Left atrium: Moderately dilated. The atrium was mildly dilated.  - Right atrium: Moderately dilated.  - Inferior vena cava: The vessel was normal in size. The  respirophasic diameter changes were in the normal range (>= 50%),  consistent with normal central venous pressure.   Impressions:  - Technically difficult study. Definity contrast given. LVEF  35-40%, moderately dilated ventricle with normal wall thickness,  global hypokinesis with regional variation inferiorly, grade 1  DD, indeterminate LV filling prsesure, mild MR, moderate biatrial  enlargement, normal IVC. Compared to a prior study in 2015, the  LVEF is reduced. [LVEF 40-45% 06/23/14]   Carotid US 01/23/10: Findings:  - RIGHT CAROTID ARTERY: There is a no significant plaque accumulation  or stenosis. Normal wave forms with no focal aliasing on color  Doppler interrogation.  - RIGHT VERTEBRAL ARTERY: Normal flow direction and waveform.  - LEFT CAROTID ARTERY: There is mild eccentric plaque in the carotid  bulb without significant stenosis. Normal wave forms with no focal  aliasing on color Doppler interrogation.  - LEFT VERTEBRAL ARTERY: Normal flow direction and waveform.  IMPRESSION:  1. Early left carotid bifurcation plaque with less than 50%  diameter stenosis. The exam does not exclude plaque ulceration or  embolization.    Past Medical History:  Diagnosis Date  . Arthritis   . Ascending aorta dilatation (HCC)   . BPH (benign prostatic hyperplasia)   . CAD (coronary artery disease)    a. MI 2004 with  occ RCA.  . Cardiomyopathy    EF 40%  . Chronic systolic CHF (congestive heart failure) ([place])   . Colon polyps   . CPAP (continuous positive airway pressure) dependence   . Diabetes mellitus, type 2 ([place])   . History of gout   . Hyperlipidemia   . Hypertension   . Idiopathic thrombocytopenia ([place])   . Morbid obesity ([place])   . Myocardial infarction ([place])   . OSA (obstructive sleep apnea)    07/25/10 AHI 14.9/hr  . Pneumonia   . PONV (postoperative nausea and vomiting)   . Primary localized osteoarthritis of left knee 06/07/2015  . Primary localized osteoarthritis of right knee 08/06/2017  . Rhinosinusitis   . Shortness of breath dyspnea   . Stroke [facility])    right amaurosis fugax, concern for retinal artery occlusion 12/2009; cerebral arteriography 01/30/10: no occlusion, stenoses, dissections, aneurysms, AVM noted, patent opthalmic arteries  . Vision decreased     Past Surgical History:  Procedure Laterality Date  . CARDIAC CATHETERIZATION  2005   no stents  . CYST EXCISION     chest and back  . HERNIA REPAIR     umbilical  . JOINT REPLACEMENT     Left 06/07/15  . KNEE CARTILAGE SURGERY     left  . KNEE SURGERY Right 08/06/2017  . MENISCUS REPAIR Left   . NASAL SINUS SURGERY    . PARTIAL KNEE ARTHROPLASTY Right 08/06/2017   Procedure: UNICOMPARTMENTAL  KNEE;  Surgeon: [name], MD;  Location: [place];  Service: Orthopedics;  Laterality: Right;  . SINUS IRRIGATION    . TOTAL KNEE ARTHROPLASTY Left 06/07/2015   Procedure: TOTAL KNEE ARTHROPLASTY;  Surgeon: [name], MD;  Location: [place];  Service: Orthopedics;  Laterality: Left;  . UMBILICAL HERNIA REPAIR      MEDICATIONS: No current facility-administered medications for this encounter.   . carvedilol (COREG) 6.25 MG tablet  . clopidogrel (PLAVIX) 75 MG tablet  . Coenzyme Q10 (CO Q 10) 100 MG CAPS  . fexofenadine (ALLEGRA) 180 MG tablet  . folic acid (FOLVITE) 423 MCG tablet  . ibuprofen (ADVIL,MOTRIN) 200 MG  tablet  . metFORMIN (GLUCOPHAGE) 500 MG tablet  . pravastatin (PRAVACHOL) 40 MG tablet  . sacubitril-valsartan (ENTRESTO) 97-103 MG  . vitamin B-12 (CYANOCOBALAMIN) 1000 MCG tablet  . furosemide (LASIX) 40 MG tablet    Myra Gianotti, PA-C Surgical Short Stay/Anesthesiology [facility] Phone [phone] [facility] Phone [phone] 02/01/2021 3:39 PM

## 2021-02-02 ENCOUNTER — Encounter (HOSPITAL_COMMUNITY)
Admission: RE | Disposition: A | Discharge status: Home / Self Care | Payer: Self-pay | Source: Home / Self Care | Attending: Surgery

## 2021-02-02 ENCOUNTER — Ambulatory Visit (HOSPITAL_COMMUNITY)
Admission: RE | Admit: 2021-02-02 | Discharge: 2021-02-02 | Disposition: A | Discharge status: Home / Self Care | Payer: Medicare HMO | Attending: Surgery | Admitting: Surgery

## 2021-02-02 ENCOUNTER — Ambulatory Visit (HOSPITAL_COMMUNITY): Payer: Medicare HMO | Admitting: Vascular Surgery

## 2021-02-02 ENCOUNTER — Encounter (HOSPITAL_COMMUNITY): Payer: Self-pay | Admitting: Surgery

## 2021-02-02 ENCOUNTER — Other Ambulatory Visit: Payer: Self-pay

## 2021-02-02 DIAGNOSIS — Z9989 Dependence on other enabling machines and devices: Secondary | ICD-10-CM | POA: Diagnosis not present

## 2021-02-02 DIAGNOSIS — G4733 Obstructive sleep apnea (adult) (pediatric): Secondary | ICD-10-CM | POA: Diagnosis not present

## 2021-02-02 DIAGNOSIS — I2510 Atherosclerotic heart disease of native coronary artery without angina pectoris: Secondary | ICD-10-CM | POA: Insufficient documentation

## 2021-02-02 DIAGNOSIS — K760 Fatty (change of) liver, not elsewhere classified: Secondary | ICD-10-CM | POA: Insufficient documentation

## 2021-02-02 DIAGNOSIS — I252 Old myocardial infarction: Secondary | ICD-10-CM | POA: Diagnosis not present

## 2021-02-02 DIAGNOSIS — Z87891 Personal history of nicotine dependence: Secondary | ICD-10-CM | POA: Diagnosis not present

## 2021-02-02 DIAGNOSIS — K8010 Calculus of gallbladder with chronic cholecystitis without obstruction: Secondary | ICD-10-CM | POA: Diagnosis not present

## 2021-02-02 DIAGNOSIS — I348 Other nonrheumatic mitral valve disorders: Secondary | ICD-10-CM | POA: Diagnosis not present

## 2021-02-02 HISTORY — PX: CHOLECYSTECTOMY: SHX55

## 2021-02-02 LAB — BASIC METABOLIC PANEL
Anion gap: 8 (ref 5–15)
BUN: 18 mg/dL (ref 8–23)
CO2: 22 mmol/L (ref 22–32)
Calcium: 9.2 mg/dL (ref 8.9–10.3)
Chloride: 108 mmol/L (ref 98–111)
Creatinine, Ser: 0.8 mg/dL (ref 0.61–1.24)
GFR, Estimated: >60 mL/min (ref >60)
Glucose, Bld: 126 mg/dL — ABNORMAL HIGH (ref 70–99)
Potassium: 4.5 mmol/L (ref 3.5–5.1)
Sodium: 138 mmol/L (ref 135–145)

## 2021-02-02 LAB — CBC
HCT: 49.1 % (ref 39.0–52.0)
Hemoglobin: 16.6 g/dL (ref 13.0–17.0)
MCH: 32.3 pg (ref 26.0–34.0)
MCHC: 33.8 g/dL (ref 30.0–36.0)
MCV: 95.5 fL (ref 80.0–100.0)
Platelets: 91 10*3/uL — ABNORMAL LOW (ref 150–400)
RBC: 5.14 MIL/uL (ref 4.22–5.81)
RDW: 13.1 % (ref 11.5–15.5)
WBC: 5.8 10*3/uL (ref 4.0–10.5)
nRBC: 0 % (ref 0.0–0.2)

## 2021-02-02 LAB — GLUCOSE, CAPILLARY
Glucose-Capillary: 128 mg/dL — ABNORMAL HIGH (ref 70–99)
Glucose-Capillary: 175 mg/dL — ABNORMAL HIGH (ref 70–99)
Glucose-Capillary: 208 mg/dL — ABNORMAL HIGH (ref 70–99)

## 2021-02-02 LAB — TYPE AND SCREEN
ABO/RH(D): A POS
Antibody Screen: NEGATIVE

## 2021-02-02 SURGERY — LAPAROSCOPIC CHOLECYSTECTOMY
Anesthesia: General | Site: Abdomen

## 2021-02-02 MED ORDER — SUCCINYLCHOLINE CHLORIDE 200 MG/10ML IV SOSY
PREFILLED_SYRINGE | INTRAVENOUS | Status: AC
  Filled 2021-02-02: qty 10

## 2021-02-02 MED ORDER — BUPIVACAINE-EPINEPHRINE (PF) 0.25% -1:200000 IJ SOLN
INTRAMUSCULAR | Status: AC
  Filled 2021-02-02: qty 30

## 2021-02-02 MED ORDER — ORAL CARE MOUTH RINSE: 15.0000 mL | Freq: Once | OROMUCOSAL | Status: AC

## 2021-02-02 MED ORDER — 0.9 % SODIUM CHLORIDE (POUR BTL) OPTIME
TOPICAL | Status: DC | PRN
  Administered 2021-02-02: 1000 mL

## 2021-02-02 MED ORDER — OXYCODONE HCL 5 MG PO TABS: 5.0000 mg | ORAL_TABLET | Freq: Four times a day (QID) | ORAL | 0 refills | Status: DC | PRN

## 2021-02-02 MED ORDER — PROPOFOL 10 MG/ML IV BOLUS
INTRAVENOUS | Status: AC
  Filled 2021-02-02: qty 40

## 2021-02-02 MED ORDER — SODIUM CHLORIDE 0.9 % IR SOLN
Status: DC | PRN
  Administered 2021-02-02: 1000 mL

## 2021-02-02 MED ORDER — DEXTROSE 5 % IV SOLN
3.0000 g | INTRAVENOUS | Status: AC
  Administered 2021-02-02: 3 g via INTRAVENOUS
  Filled 2021-02-02: qty 3000

## 2021-02-02 MED ORDER — HYDROMORPHONE HCL 1 MG/ML IJ SOLN
INTRAMUSCULAR | Status: AC
  Administered 2021-02-02: 0.25 mg via INTRAVENOUS
  Filled 2021-02-02: qty 1

## 2021-02-02 MED ORDER — OXYCODONE HCL 5 MG/5ML PO SOLN: 5.0000 mg | Freq: Once | ORAL | Status: AC | PRN

## 2021-02-02 MED ORDER — LIDOCAINE 2% (20 MG/ML) 5 ML SYRINGE
INTRAMUSCULAR | Status: DC | PRN
  Administered 2021-02-02: 40 mg via INTRAVENOUS

## 2021-02-02 MED ORDER — EPHEDRINE SULFATE 50 MG/ML IJ SOLN
INTRAMUSCULAR | Status: DC | PRN
  Administered 2021-02-02: 5 mg via INTRAVENOUS

## 2021-02-02 MED ORDER — LIDOCAINE 2% (20 MG/ML) 5 ML SYRINGE
INTRAMUSCULAR | Status: AC
  Filled 2021-02-02: qty 5

## 2021-02-02 MED ORDER — LACTATED RINGERS IV SOLN: INTRAVENOUS | Status: DC

## 2021-02-02 MED ORDER — KETOROLAC TROMETHAMINE 15 MG/ML IJ SOLN
INTRAMUSCULAR | Status: DC | PRN
  Administered 2021-02-02: 15 mg via INTRAVENOUS

## 2021-02-02 MED ORDER — KETOROLAC TROMETHAMINE 30 MG/ML IJ SOLN
INTRAMUSCULAR | Status: AC
  Filled 2021-02-02: qty 1

## 2021-02-02 MED ORDER — PHENYLEPHRINE HCL-NACL 10-0.9 MG/250ML-% IV SOLN
INTRAVENOUS | Status: DC | PRN
  Administered 2021-02-02: 50 ug/min via INTRAVENOUS

## 2021-02-02 MED ORDER — HYDROMORPHONE HCL 1 MG/ML IJ SOLN
0.2500 mg | INTRAMUSCULAR | Status: DC | PRN
  Administered 2021-02-02: 0.25 mg via INTRAVENOUS
  Administered 2021-02-02: 0.5 mg via INTRAVENOUS

## 2021-02-02 MED ORDER — PROMETHAZINE HCL 25 MG/ML IJ SOLN
6.2500 mg | INTRAMUSCULAR | Status: DC | PRN
  Administered 2021-02-02: 6.25 mg via INTRAVENOUS

## 2021-02-02 MED ORDER — DEXAMETHASONE SODIUM PHOSPHATE 10 MG/ML IJ SOLN
INTRAMUSCULAR | Status: AC
  Filled 2021-02-02: qty 1

## 2021-02-02 MED ORDER — MIDAZOLAM HCL 2 MG/2ML IJ SOLN: 0.5000 mg | Freq: Once | INTRAMUSCULAR | Status: DC | PRN

## 2021-02-02 MED ORDER — BUPIVACAINE-EPINEPHRINE 0.25% -1:200000 IJ SOLN
INTRAMUSCULAR | Status: DC | PRN
  Administered 2021-02-02: 20 mL

## 2021-02-02 MED ORDER — EPHEDRINE 5 MG/ML INJ
INTRAVENOUS | Status: AC
  Filled 2021-02-02: qty 10

## 2021-02-02 MED ORDER — PHENYLEPHRINE 40 MCG/ML (10ML) SYRINGE FOR IV PUSH (FOR BLOOD PRESSURE SUPPORT)
PREFILLED_SYRINGE | INTRAVENOUS | Status: AC
  Filled 2021-02-02: qty 10

## 2021-02-02 MED ORDER — OXYCODONE HCL 5 MG PO TABS
5.0000 mg | ORAL_TABLET | Freq: Once | ORAL | Status: AC | PRN
  Administered 2021-02-02: 5 mg via ORAL

## 2021-02-02 MED ORDER — ONDANSETRON HCL 4 MG/2ML IJ SOLN
INTRAMUSCULAR | Status: DC | PRN
  Administered 2021-02-02: 4 mg via INTRAVENOUS

## 2021-02-02 MED ORDER — PROPOFOL 10 MG/ML IV BOLUS
INTRAVENOUS | Status: DC | PRN
  Administered 2021-02-02: 100 mg via INTRAVENOUS
  Administered 2021-02-02: 50 mg via INTRAVENOUS

## 2021-02-02 MED ORDER — ROCURONIUM BROMIDE 10 MG/ML (PF) SYRINGE
PREFILLED_SYRINGE | INTRAVENOUS | Status: AC
  Filled 2021-02-02: qty 10

## 2021-02-02 MED ORDER — STERILE WATER FOR IRRIGATION IR SOLN
Status: DC | PRN
  Administered 2021-02-02: 1000 mL

## 2021-02-02 MED ORDER — SUGAMMADEX SODIUM 200 MG/2ML IV SOLN
INTRAVENOUS | Status: DC | PRN
  Administered 2021-02-02: 300 mg via INTRAVENOUS

## 2021-02-02 MED ORDER — FENTANYL CITRATE (PF) 250 MCG/5ML IJ SOLN
INTRAMUSCULAR | Status: DC | PRN
  Administered 2021-02-02: 100 ug via INTRAVENOUS
  Administered 2021-02-02 (×2): 50 ug via INTRAVENOUS

## 2021-02-02 MED ORDER — CHLORHEXIDINE GLUCONATE 0.12 % MT SOLN
OROMUCOSAL | Status: AC
  Administered 2021-02-02: 15 mL via OROMUCOSAL
  Filled 2021-02-02: qty 15

## 2021-02-02 MED ORDER — MIDAZOLAM HCL 5 MG/5ML IJ SOLN
INTRAMUSCULAR | Status: DC | PRN
  Administered 2021-02-02: 2 mg via INTRAVENOUS

## 2021-02-02 MED ORDER — CHLORHEXIDINE GLUCONATE 0.12 % MT SOLN: 15.0000 mL | Freq: Once | OROMUCOSAL | Status: AC

## 2021-02-02 MED ORDER — MEPERIDINE HCL 25 MG/ML IJ SOLN: 6.2500 mg | INTRAMUSCULAR | Status: DC | PRN

## 2021-02-02 MED ORDER — ONDANSETRON HCL 4 MG/2ML IJ SOLN
INTRAMUSCULAR | Status: AC
  Filled 2021-02-02: qty 2

## 2021-02-02 MED ORDER — PROMETHAZINE HCL 25 MG/ML IJ SOLN
INTRAMUSCULAR | Status: AC
  Filled 2021-02-02: qty 1

## 2021-02-02 MED ORDER — ROCURONIUM BROMIDE 10 MG/ML (PF) SYRINGE
PREFILLED_SYRINGE | INTRAVENOUS | Status: DC | PRN
  Administered 2021-02-02: 40 mg via INTRAVENOUS
  Administered 2021-02-02: 60 mg via INTRAVENOUS

## 2021-02-02 MED ORDER — MIDAZOLAM HCL 2 MG/2ML IJ SOLN
INTRAMUSCULAR | Status: AC
  Filled 2021-02-02: qty 2

## 2021-02-02 MED ORDER — OXYCODONE HCL 5 MG PO TABS
ORAL_TABLET | ORAL | Status: AC
  Filled 2021-02-02: qty 1

## 2021-02-02 MED ORDER — FENTANYL CITRATE (PF) 250 MCG/5ML IJ SOLN
INTRAMUSCULAR | Status: AC
  Filled 2021-02-02: qty 5

## 2021-02-02 MED ORDER — DEXAMETHASONE SODIUM PHOSPHATE 10 MG/ML IJ SOLN
INTRAMUSCULAR | Status: DC | PRN
  Administered 2021-02-02: 10 mg via INTRAVENOUS

## 2021-02-02 SURGICAL SUPPLY — 42 items
APPLIER CLIP 5 13 M/L LIGAMAX5 (MISCELLANEOUS) ×2
BLADE CLIPPER SURG (BLADE) ×2 IMPLANT
CANISTER SUCT 3000ML PPV (MISCELLANEOUS) ×2 IMPLANT
CHLORAPREP W/TINT 26 (MISCELLANEOUS) ×2 IMPLANT
CLIP APPLIE 5 13 M/L LIGAMAX5 (MISCELLANEOUS) ×1 IMPLANT
COVER SURGICAL LIGHT HANDLE (MISCELLANEOUS) ×2 IMPLANT
COVER WAND RF STERILE (DRAPES) ×2 IMPLANT
DERMABOND ADVANCED (GAUZE/BANDAGES/DRESSINGS) ×1
DERMABOND ADVANCED .7 DNX12 (GAUZE/BANDAGES/DRESSINGS) ×1 IMPLANT
ELECT REM PT RETURN 9FT ADLT (ELECTROSURGICAL) ×2
ELECTRODE REM PT RTRN 9FT ADLT (ELECTROSURGICAL) ×1 IMPLANT
GLOVE BIOGEL PI IND STRL 6 (GLOVE) ×1 IMPLANT
GLOVE BIOGEL PI INDICATOR 6 (GLOVE) ×1
GLOVE BIOGEL PI MICRO 5.5 (GLOVE) ×1
GLOVE BIOGEL PI MICRO STRL 5.5 (GLOVE) ×1 IMPLANT
GOWN STRL REUS W/ TWL LRG LVL3 (GOWN DISPOSABLE) ×3 IMPLANT
GOWN STRL REUS W/TWL LRG LVL3 (GOWN DISPOSABLE) ×3
KIT BASIN OR (CUSTOM PROCEDURE TRAY) ×2 IMPLANT
KIT TURNOVER KIT B (KITS) ×2 IMPLANT
L-HOOK LAP DISP 36CM (ELECTROSURGICAL) ×2
LHOOK LAP DISP 36CM (ELECTROSURGICAL) ×1 IMPLANT
NEEDLE INSUFFLATION 14GA 120MM (NEEDLE) ×2 IMPLANT
NS IRRIG 1000ML POUR BTL (IV SOLUTION) ×2 IMPLANT
PAD ARMBOARD 7.5X6 YLW CONV (MISCELLANEOUS) ×2 IMPLANT
PENCIL BUTTON HOLSTER BLD 10FT (ELECTRODE) ×2 IMPLANT
POUCH SPECIMEN RETRIEVAL 10MM (ENDOMECHANICALS) ×2 IMPLANT
SCISSORS LAP 5X35 DISP (ENDOMECHANICALS) ×2 IMPLANT
SET IRRIG TUBING LAPAROSCOPIC (IRRIGATION / IRRIGATOR) ×2 IMPLANT
SET TUBE SMOKE EVAC HIGH FLOW (TUBING) ×2 IMPLANT
SLEEVE ENDOPATH XCEL 5M (ENDOMECHANICALS) ×4 IMPLANT
SPECIMEN JAR SMALL (MISCELLANEOUS) ×2 IMPLANT
SUT MNCRL AB 4-0 PS2 18 (SUTURE) ×2 IMPLANT
SUT VIC AB 3-0 SH 27 (SUTURE)
SUT VIC AB 3-0 SH 27XBRD (SUTURE) IMPLANT
SUT VICRYL 0 UR6 27IN ABS (SUTURE) ×2 IMPLANT
TOWEL GREEN STERILE (TOWEL DISPOSABLE) ×2 IMPLANT
TOWEL GREEN STERILE FF (TOWEL DISPOSABLE) ×2 IMPLANT
TRAY LAPAROSCOPIC MC (CUSTOM PROCEDURE TRAY) ×2 IMPLANT
TROCAR XCEL 12X100 BLDLESS (ENDOMECHANICALS) ×2 IMPLANT
TROCAR XCEL BLUNT TIP 100MML (ENDOMECHANICALS) IMPLANT
TROCAR XCEL NON-BLD 5MMX100MML (ENDOMECHANICALS) ×2 IMPLANT
WATER STERILE IRR 1000ML POUR (IV SOLUTION) ×2 IMPLANT

## 2021-02-02 NOTE — Op Note (Signed)
Date: 02/02/21  Patient: Gordon Madden MRN: [id number]  Preoperative Diagnosis: Symptomatic cholelithiasis Postoperative Diagnosis: Symptomatic cholelithiasis, chronic cholecystitis  Procedure: Laparoscopic cholecystectomy  Surgeon: Michaelle Birks, MD Assistant: Armandina Gemma, MD  EBL: Minimal  Anesthesia: General  Specimens: Gallbladder  Indications: Mr. [name] is a 70 yo male who presented with frequent RUQ abdominal pain. RUQ US showed cholelithiasis. After an extensive discussion of the risks and benefits of surgery, he agreed to proceed with cholecystectomy.  Findings: Cholelithiasis, chronic inflammation of the gallbladder.  Procedure details: Informed consent was obtained in the preoperative area prior to the procedure. The patient was brought to the operating room and placed on the table in the supine position. General anesthesia was induced and appropriate lines and drains were placed for intraoperative monitoring. Perioperative antibiotics were administered per SCIP guidelines. The abdomen was prepped and draped in the usual sterile fashion. A pre-procedure timeout was taken verifying patient identity, surgical site and procedure to be performed.  A small supraumbilical skin incision was made several centimeters above the patient's prior umbilical scar, the subcutaneous tissue was divided with cautery, and the fascia was grasped and elevated. A Veress needle was inserted through the fascia and intraperitoneal placement was confirmed with the saline drop test. A 33mm trocar was placed and the abdomen was insufflated. The peritoneal cavity was inspected with no evidence of visceral or vascular injury. Three 72mm ports were placed in the right subcostal margin, all under direct visualization. The fundus of the gallbladder was grasped and retracted cephalad. The infundibulum was retracted laterally. The peritoneum over the gallbladder was thickened, consistent with chronic cholecystitis. The  cystic triangle was dissected out using cautery and blunt dissection, and the critical view of safety was obtained. The cystic artery was divided with cautery high on the gallbladder. The cystic duct was clipped and ligated. The gallbladder was taken off the liver using cautery. The specimen was placed in an endocatch bag and removed. The surgical site was irrigated with saline until the effluent was clear. Hemostasis was achieved in the gallbladder fossa using cautery. The cystic duct and artery stumps were visually inspected and there was no evidence of bile leak or bleeding. The ports were removed under direct visualization and the abdomen was desufflated. The umbilical port site fascia was closed with a 0 vicryl suture. The skin at all port sites was closed with 4-0 monocryl subcuticular suture. Dermabond was applied.  The patient tolerated the procedure well with no apparent complications. All counts were correct x2 at the end of the procedure. The patient was extubated and taken to PACU in stable condition.  Michaelle Birks, MD  02/02/21 10:47 AM

## 2021-02-02 NOTE — Interval H&P Note (Signed)
History and Physical Interval Note:  02/02/2021 8:58 AM  Gordon Madden  has presented today for surgery, with the diagnosis of SYMPTOMATIC CHOLELITHIASIS.  The various methods of treatment have been discussed with the patient and family. After consideration of risks, benefits and other options for treatment, the patient has consented to  Procedure(s) with comments: LAPAROSCOPIC CHOLECYSTECTOMY (N/A) - ROOM 2 STARTING AT 09:30 AM FOR 90 MIN as a surgical intervention.  The patient's history has been reviewed, patient examined, no change in status, stable for surgery.  I have reviewed the patient's chart and labs.  Questions were answered to the patient's satisfaction. Received clearance from patient's cardiologist to hold plavix and proceed with surgery with no further cardiac workup. Plavix has been on hold for 5 days. Platelet count today is 91. Proceed with surgery, plan for discharge home from PACU.   [name]

## 2021-02-02 NOTE — Anesthesia Procedure Notes (Addendum)
Procedure Name: Intubation Date/Time: 02/02/2021 9:40 AM Performed by: Amadeo Garnet, CRNA Pre-anesthesia Checklist: Patient identified, Emergency Drugs available, Suction available and Patient being monitored Patient Re-evaluated:Patient Re-evaluated prior to induction Oxygen Delivery Method: Circle system utilized Preoxygenation: Pre-oxygenation with 100% oxygen Induction Type: IV induction Ventilation: Mask ventilation without difficulty and Oral airway inserted - appropriate to patient size Laryngoscope Size: Mac and 4 Grade View: Grade I Tube type: Oral Tube size: 8.0 mm Number of attempts: 1 Airway Equipment and Method: Stylet and Oral airway Placement Confirmation: ETT inserted through vocal cords under direct vision,  positive ETCO2 and breath sounds checked- equal and bilateral Secured at: 23 cm Tube secured with: Tape Dental Injury: Teeth and Oropharynx as per pre-operative assessment

## 2021-02-02 NOTE — Transfer of Care (Signed)
Immediate Anesthesia Transfer of Care Note  Patient: Gordon Madden  Procedure(s) Performed: LAPAROSCOPIC CHOLECYSTECTOMY (N/A Abdomen)  Patient Location: PACU  Anesthesia Type:General  Level of Consciousness: awake, alert  and oriented  Airway & Oxygen Therapy: Patient Spontanous Breathing and Patient connected to face mask oxygen  Post-op Assessment: Report given to RN, Post -op Vital signs reviewed and stable and Patient moving all extremities  Post vital signs: Reviewed and stable  Last Vitals:  Vitals Value Taken Time  BP 131/87 02/02/21 1055  Temp    Pulse 42 02/02/21 1058  Resp 10 02/02/21 1058  SpO2 97 % 02/02/21 1058  Vitals shown include unvalidated device data.  Last Pain:  Vitals:   02/02/21 0713  TempSrc: Oral  PainSc: 0-No pain         Complications: No complications documented.

## 2021-02-02 NOTE — Progress Notes (Signed)
Patient reports nausea at this time reports not feeling well medicated for nausea

## 2021-02-02 NOTE — Discharge Instructions (Signed)
[facility] DISCHARGE INSTRUCTIONS  Activity . No heavy lifting greater than 10 pounds for 4 weeks after surgery. [name] Ok to shower in 24 hours, but do not bathe or submerge incisions underwater. . Do not drive while taking narcotic pain medication.  Wound Care . Your incisions are covered with skin glue called Dermabond. This will peel off on its own over time. . You may shower and allow warm soapy water to run over your incisions in 24 hours after surgery. Gently pat dry. . Do not submerge your incision underwater. . Monitor your incision for any new redness, tenderness, or drainage.  When to Call us: [name] Fever greater than 100.5 . New redness, drainage, or swelling at incision site . Severe pain, nausea, or vomiting . Jaundice (yellowing of the whites of the eyes or skin)  Follow-up You have an appointment scheduled with Dr. Zenia Resides on February 28, 2021 at 10:45am. This will be at the [facility] office at [address] [place], [place], [place]. Please arrive at least 15 minutes prior to your scheduled appointment time.  You may resume taking your Plavix in 48 hours after surgery (on Saturday, March 12).  For questions or concerns, please call the office at [phone].

## 2021-02-02 NOTE — Anesthesia Postprocedure Evaluation (Signed)
Anesthesia Post Note  Patient: Gordon Madden  Procedure(s) Performed: LAPAROSCOPIC CHOLECYSTECTOMY (N/A Abdomen)     Patient location during evaluation: Phase II Anesthesia Type: General Level of consciousness: awake and alert, patient cooperative and oriented Pain management: pain level controlled Vital Signs Assessment: post-procedure vital signs reviewed and stable Respiratory status: spontaneous breathing, nonlabored ventilation and respiratory function stable Cardiovascular status: blood pressure returned to baseline and stable Postop Assessment: no apparent nausea or vomiting and able to ambulate (nausea resolved) Anesthetic complications: no   No complications documented.  Last Vitals:  Vitals:   02/02/21 1125 02/02/21 1139  BP: 103/66 99/77  Pulse: (!) 41 (!) 42  Resp: 14 13  Temp:  36.6 C  SpO2: 97% 95%    Last Pain:  Vitals:   02/02/21 1125  TempSrc:   PainSc: 4                  Markayla Reichart,E. Deena Shaub

## 2021-02-03 ENCOUNTER — Encounter (HOSPITAL_COMMUNITY): Payer: Self-pay | Admitting: Surgery

## 2021-02-03 LAB — SURGICAL PATHOLOGY

## 2021-02-13 DIAGNOSIS — F330 Major depressive disorder, recurrent, mild: Secondary | ICD-10-CM | POA: Diagnosis not present

## 2021-02-21 DIAGNOSIS — M79671 Pain in right foot: Secondary | ICD-10-CM | POA: Diagnosis not present

## 2021-02-21 DIAGNOSIS — N400 Enlarged prostate without lower urinary tract symptoms: Secondary | ICD-10-CM | POA: Diagnosis not present

## 2021-03-27 DIAGNOSIS — F330 Major depressive disorder, recurrent, mild: Secondary | ICD-10-CM | POA: Diagnosis not present

## 2021-04-10 DIAGNOSIS — N181 Chronic kidney disease, stage 1: Secondary | ICD-10-CM | POA: Diagnosis not present

## 2021-04-10 DIAGNOSIS — M79671 Pain in right foot: Secondary | ICD-10-CM | POA: Diagnosis not present

## 2021-04-10 DIAGNOSIS — E1121 Type 2 diabetes mellitus with diabetic nephropathy: Secondary | ICD-10-CM | POA: Diagnosis not present

## 2021-04-10 DIAGNOSIS — E782 Mixed hyperlipidemia: Secondary | ICD-10-CM | POA: Diagnosis not present

## 2021-04-10 DIAGNOSIS — I340 Nonrheumatic mitral (valve) insufficiency: Secondary | ICD-10-CM | POA: Diagnosis not present

## 2021-04-10 DIAGNOSIS — G4733 Obstructive sleep apnea (adult) (pediatric): Secondary | ICD-10-CM | POA: Diagnosis not present

## 2021-04-10 DIAGNOSIS — I429 Cardiomyopathy, unspecified: Secondary | ICD-10-CM | POA: Diagnosis not present

## 2021-04-10 DIAGNOSIS — D696 Thrombocytopenia, unspecified: Secondary | ICD-10-CM | POA: Diagnosis not present

## 2021-04-10 DIAGNOSIS — I119 Hypertensive heart disease without heart failure: Secondary | ICD-10-CM | POA: Diagnosis not present

## 2021-04-12 ENCOUNTER — Ambulatory Visit: Payer: Medicare HMO | Admitting: Podiatry

## 2021-04-12 ENCOUNTER — Other Ambulatory Visit: Payer: Self-pay

## 2021-04-12 ENCOUNTER — Ambulatory Visit (INDEPENDENT_AMBULATORY_CARE_PROVIDER_SITE_OTHER): Payer: Medicare HMO

## 2021-04-12 DIAGNOSIS — M79671 Pain in right foot: Secondary | ICD-10-CM

## 2021-04-12 DIAGNOSIS — M76821 Posterior tibial tendinitis, right leg: Secondary | ICD-10-CM | POA: Diagnosis not present

## 2021-04-12 DIAGNOSIS — B351 Tinea unguium: Secondary | ICD-10-CM

## 2021-04-12 MED ORDER — DICLOFENAC SODIUM 75 MG PO TBEC: 75.0000 mg | DELAYED_RELEASE_TABLET | Freq: Two times a day (BID) | ORAL | 2 refills | Status: DC

## 2021-04-12 NOTE — Progress Notes (Signed)
Subjective:   Patient ID: Gordon Madden, male   DOB: 70 y.o.   MRN: [id number]   HPI Patient presents with the inside of the ankle being quite sore stating its been hurting him for a few months and also concerned about the discoloration of his nailbeds.  States it is worse when he lays down and at times bothers him and it does bother him when it moves side to side   ROS      Objective:  Physical Exam  Neurovascular status intact with inflammation pain around the posterior tibial tendon right with no indication of muscle dysfunction and quite a bit of discomfort when I inverted and everted the right foot.  Left shows significant thickness with discoloration of the hallux nail and several other nails have discoloration     Assessment:  Posterior tibial tendinitis right inflammation fluid around the medial side along with mycotic nail infection with probable trauma as secondary factor     Plan:  H&P reviewed both conditions and for the right I did sheath injection 3 mg Kenalog 5 mg Xylocaine advised on fascial strap which was dispensed today and I gave him instructions on supportive shoes.  If symptoms persist may need to consider other treatments or possible MRI and also placed on diclofenac 75 mg twice daily  X-rays were negative for signs of fracture or arthritis associated with this

## 2021-04-26 ENCOUNTER — Other Ambulatory Visit: Payer: Self-pay

## 2021-04-26 ENCOUNTER — Ambulatory Visit (INDEPENDENT_AMBULATORY_CARE_PROVIDER_SITE_OTHER): Payer: Medicare HMO | Admitting: Podiatry

## 2021-04-26 ENCOUNTER — Encounter: Payer: Self-pay | Admitting: Podiatry

## 2021-04-26 DIAGNOSIS — I119 Hypertensive heart disease without heart failure: Secondary | ICD-10-CM | POA: Insufficient documentation

## 2021-04-26 DIAGNOSIS — J309 Allergic rhinitis, unspecified: Secondary | ICD-10-CM | POA: Insufficient documentation

## 2021-04-26 DIAGNOSIS — E1121 Type 2 diabetes mellitus with diabetic nephropathy: Secondary | ICD-10-CM | POA: Insufficient documentation

## 2021-04-26 DIAGNOSIS — M76821 Posterior tibial tendinitis, right leg: Secondary | ICD-10-CM

## 2021-04-26 DIAGNOSIS — Z860100 Personal history of colon polyps, unspecified: Secondary | ICD-10-CM | POA: Insufficient documentation

## 2021-04-26 DIAGNOSIS — I5022 Chronic systolic (congestive) heart failure: Secondary | ICD-10-CM | POA: Insufficient documentation

## 2021-04-26 DIAGNOSIS — K8020 Calculus of gallbladder without cholecystitis without obstruction: Secondary | ICD-10-CM | POA: Insufficient documentation

## 2021-04-26 DIAGNOSIS — E782 Mixed hyperlipidemia: Secondary | ICD-10-CM | POA: Insufficient documentation

## 2021-04-26 DIAGNOSIS — M79671 Pain in right foot: Secondary | ICD-10-CM

## 2021-04-26 DIAGNOSIS — K760 Fatty (change of) liver, not elsewhere classified: Secondary | ICD-10-CM | POA: Insufficient documentation

## 2021-04-26 DIAGNOSIS — I429 Cardiomyopathy, unspecified: Secondary | ICD-10-CM | POA: Insufficient documentation

## 2021-04-26 DIAGNOSIS — N181 Chronic kidney disease, stage 1: Secondary | ICD-10-CM | POA: Insufficient documentation

## 2021-04-26 DIAGNOSIS — K529 Noninfective gastroenteritis and colitis, unspecified: Secondary | ICD-10-CM | POA: Insufficient documentation

## 2021-04-26 NOTE — Progress Notes (Signed)
Subjective:   Patient ID: Gordon Madden, male   DOB: 70 y.o.   MRN: [id number]   HPI Patient states he seems improved but still can get some discomfort at times   ROS      Objective:  Physical Exam  Neurovascular status intact with discomfort around the posterior tibial tendon right that is improved mild pain when I pressed into the area with patient doing well overall     Assessment:  Doing well posterior tibial tendinitis right with bracing and has old orthotics     Plan:  Advised on continued brace usage orthotics ice supportive shoes and if symptoms were to get worse we will need to consider MRI or other treatment if it does not respond

## 2021-05-01 DIAGNOSIS — F330 Major depressive disorder, recurrent, mild: Secondary | ICD-10-CM | POA: Diagnosis not present

## 2021-06-20 ENCOUNTER — Encounter: Payer: Self-pay | Admitting: Internal Medicine

## 2021-06-21 NOTE — Progress Notes (Signed)
Patient ID: Gordon Madden, male    DOB: 07-05-1951, 70 y.o.   MRN: [id number]  HPI male cigar smoker followed for OSA, insomnia, rhinitis, complicated by DM, HBP, CAD/MI, CVA/stroke NPSG 07/25/2010 AHI 14.9 per hour Office Spirometry 05/15/17-moderate restriction of exhaled volume and at least mild obstruction. FVC 2.66/55%, FEV1 2.07/58%, ratio 0.78, FEF 25-75% 1.91/69% ------------------------------------------------------------------------------------------------    06/22/20- 70 year old male former smoker followed for OSA, insomnia, rhinitis, complicated by DM2, HBP, CAD/MI/ CM EF 36%, , CVA/stroke CPAP  Auto 10-15/Advanced Download compliance 67%, AHI 0.1/ hr Body weight today - 334 lbs ------Patient is here for yearly cpap follow. Feels good overall Had 2 Moderna Covax Heat and humidity increase his DOE, but denies acute respiratory symptoms. Uses [employer] for long drives. Feels sleepiness is controlled by CPAP. Continues following with cardiology. Weight up with Covid precautions.   06/22/21- 70 year old male former smoker (60 pk yrs) followed for OSA, insomnia, rhinitis, complicated by DM2, HTN, CAD/MI/ CM EF 36% , CVA/stroke, Depression, BPH CPAP  Auto 10-15/[employer] Download- compliance 53%, AHI 0.5/ hr Body weight today- 309 lbs      !!! Covid vax- 3 Moderna, 1 Phizer Mostly quit ETOH, helping significant weight loss. Doing well with CPAP. Left it at home on recent vacation and wife told him he only snored a little. He sleeps well with it and doesn't mind continuing. Discussed possibility with further weight loss->might do HST to see how he is doing.  Review of Systems-see HPI   + = positive Constitutional:   No-   weight loss, night sweats, fevers, chills, fatigue, lassitude. HEENT:   No-  headaches, difficulty swallowing, tooth/dental problems, sore throat,      No-sneezing, itching, ear ache, nasal congestion, post nasal drip,  CV:  +chest pain, orthopnea, PND, swelling in lower  extremities, anasarca, dizziness, palpitations Resp: +  shortness of breath with exertion or at rest.              No-   productive cough,  No non-productive cough,  No-  coughing up of blood.              No-   change in color of mucus.  No- wheezing.   Skin: No-   rash or lesions. GI:  No-   heartburn, indigestion, abdominal pain, +nausea,  GU:  MS:  +joint pain or swelling. . Neuro- nothing unusual  Psych:  No- change in mood or affect. No depression or anxiety.  No memory loss.  Objective:   Physical Exam General- Alert, Oriented, Affect-appropriate, Distress- none acute;  + Morbidly obese Skin- + cool,  Lymphadenopathy- none Head- atraumatic            Eyes- Gross vision intact, PERRLA, conjunctivae clear secretions            Ears- Hearing, canals normal            Nose- Clear, Septal dev- not apparent from anterior., mucus, polyps, erosion, perforation             Throat- Mallampati III , mucosa clear , drainage- none, tonsils- atrophic Neck- flexible , trachea midline, no stridor , thyroid nl, carotid no bruit Chest - symmetrical excursion , unlabored           Heart/CV- RRR , no murmur , no gallop  , no rub, nl s1 s2                           -  JVD- none , edema- none, stasis changes+, varices- none           Lung- clear to P&A, wheeze- none, cough- none , dullness-none, rub- none           Chest wall-  Abd-  Br/ Gen/ Rectal- Not done, not indicated Extrem- cyanosis- none, clubbing, none, atrophy- none, strength- nl, + surgical scar left knee Neuro- grossly intact to observation

## 2021-06-22 ENCOUNTER — Other Ambulatory Visit: Payer: Self-pay

## 2021-06-22 ENCOUNTER — Ambulatory Visit: Payer: Medicare HMO | Admitting: Internal Medicine

## 2021-06-22 ENCOUNTER — Encounter: Payer: Self-pay | Admitting: Internal Medicine

## 2021-06-22 DIAGNOSIS — Z6841 Body Mass Index (BMI) 40.0 and over, adult: Secondary | ICD-10-CM | POA: Diagnosis not present

## 2021-06-22 DIAGNOSIS — G4733 Obstructive sleep apnea (adult) (pediatric): Secondary | ICD-10-CM

## 2021-06-22 NOTE — Addendum Note (Signed)
Addended by: Amado Coe on: 06/22/2021 01:45 PM   Modules accepted: Orders

## 2021-06-22 NOTE — Patient Instructions (Signed)
Order- DME [employer]- please replace old CPAP machine, continue auto 10-15, mask of choice, humidifier, supplies, Airview/ card  Glad you are doing well. Please call if we can help

## 2021-06-22 NOTE — Assessment & Plan Note (Signed)
Benefits from CPAP. With enough weight loss he might not need it. Plan- replace old CPAP machine, continue auto 10-15

## 2021-06-22 NOTE — Assessment & Plan Note (Signed)
Still morbidly obese, but has lost about 25 lbs since last here. Plan- continue exercise/ diet toward normal weight.

## 2021-08-11 ENCOUNTER — Ambulatory Visit (INDEPENDENT_AMBULATORY_CARE_PROVIDER_SITE_OTHER): Payer: Medicare HMO

## 2021-08-11 ENCOUNTER — Encounter: Payer: Self-pay | Admitting: Podiatry

## 2021-08-11 ENCOUNTER — Other Ambulatory Visit: Payer: Self-pay

## 2021-08-11 ENCOUNTER — Other Ambulatory Visit: Payer: Self-pay | Admitting: Podiatry

## 2021-08-11 ENCOUNTER — Ambulatory Visit: Payer: Medicare HMO | Admitting: Podiatry

## 2021-08-11 DIAGNOSIS — M79672 Pain in left foot: Secondary | ICD-10-CM

## 2021-08-11 DIAGNOSIS — M778 Other enthesopathies, not elsewhere classified: Secondary | ICD-10-CM

## 2021-08-11 DIAGNOSIS — G4733 Obstructive sleep apnea (adult) (pediatric): Secondary | ICD-10-CM | POA: Diagnosis not present

## 2021-08-11 DIAGNOSIS — M76821 Posterior tibial tendinitis, right leg: Secondary | ICD-10-CM | POA: Diagnosis not present

## 2021-08-11 DIAGNOSIS — G4710 Hypersomnia, unspecified: Secondary | ICD-10-CM | POA: Diagnosis not present

## 2021-08-11 MED ORDER — TRIAMCINOLONE ACETONIDE 10 MG/ML IJ SUSP
10.0000 mg | Freq: Once | INTRAMUSCULAR | Status: AC
  Administered 2021-08-11: 10 mg

## 2021-08-11 NOTE — Progress Notes (Signed)
Subjective:   Patient ID: Gordon Madden, male   DOB: 70 y.o.   MRN: [id number]   HPI Patient states that he is developed a lot of pain on top of his left foot over the last month without history of injury and the right ankle is improved but still moderately painful and is trying to be careful with shoe gear and wearing devices   ROS      Objective:  Physical Exam  Neurovascular status intact with patient's left anterior tibial and extensor tendon group found to be inflamed mostly within the extensor group with fluid buildup.  It is moderately edematous in this area no muscle strength loss in the right posterior tibial is mildly tender improved from previous treatments done     Assessment:  Extensor tendinitis mild anterior tibial tendinitis left with patient noted to have a improving posterior tibial tendinitis right still present      Plan:  H&P conditions reviewed separately.  For the left I reviewed x-ray I did sterile prep and then injected the extensor tendon and anterior tibial tendon complex 3 mg Kenalog 5 mg Xylocaine.  For the right we will continue with brace usage and supportive shoes along with stretching exercises and I did again discussed possibility long-term for orthotics depending on response to this continue treatment.  Reappoint to recheck as needed  X-rays indicate there is moderate changes on the left with some indications of midtarsal joint arthritis no indications of stress fracture or acute injury

## 2021-09-08 ENCOUNTER — Other Ambulatory Visit: Payer: Self-pay

## 2021-09-08 ENCOUNTER — Encounter: Payer: Self-pay | Admitting: Podiatry

## 2021-09-08 ENCOUNTER — Ambulatory Visit: Payer: Medicare HMO | Admitting: Podiatry

## 2021-09-08 DIAGNOSIS — M778 Other enthesopathies, not elsewhere classified: Secondary | ICD-10-CM

## 2021-09-12 NOTE — Progress Notes (Signed)
Subjective:   Patient ID: Gordon Madden, male   DOB: 70 y.o.   MRN: [id number]   HPI Patient continues to experience a lot of pain in the left foot and states that its been very sore and that it did not respond to medication that we use to previous visit nerve   ROS      Objective:  Physical Exam  Vascular status intact with inflammation of the extensor tendon complex and into the lateral foot around the peroneal tendon left that is painful when pressed     Assessment:  Extensor tendinitis with inflammation and peroneal tendinitis not responding to conventional treatment     Plan:  H&P reviewed possibility for MRI and at this point I went ahead and I applied air fracture walker with all instructions on usage.  Patient will be seen back to recheck again and is encouraged to call with questions and we will use topical medicines ice therapy oral

## 2021-10-17 DIAGNOSIS — I119 Hypertensive heart disease without heart failure: Secondary | ICD-10-CM | POA: Diagnosis not present

## 2021-10-17 DIAGNOSIS — E1121 Type 2 diabetes mellitus with diabetic nephropathy: Secondary | ICD-10-CM | POA: Diagnosis not present

## 2021-10-17 DIAGNOSIS — I2510 Atherosclerotic heart disease of native coronary artery without angina pectoris: Secondary | ICD-10-CM | POA: Diagnosis not present

## 2021-10-17 DIAGNOSIS — E782 Mixed hyperlipidemia: Secondary | ICD-10-CM | POA: Diagnosis not present

## 2021-10-17 DIAGNOSIS — I5022 Chronic systolic (congestive) heart failure: Secondary | ICD-10-CM | POA: Diagnosis not present

## 2021-10-17 DIAGNOSIS — Z0000 Encounter for general adult medical examination without abnormal findings: Secondary | ICD-10-CM | POA: Diagnosis not present

## 2021-10-17 DIAGNOSIS — Z1389 Encounter for screening for other disorder: Secondary | ICD-10-CM | POA: Diagnosis not present

## 2021-10-17 DIAGNOSIS — Z125 Encounter for screening for malignant neoplasm of prostate: Secondary | ICD-10-CM | POA: Diagnosis not present

## 2021-10-17 DIAGNOSIS — D696 Thrombocytopenia, unspecified: Secondary | ICD-10-CM | POA: Diagnosis not present

## 2021-10-25 ENCOUNTER — Other Ambulatory Visit: Payer: Self-pay | Admitting: Interventional Cardiology

## 2021-10-25 MED ORDER — ENTRESTO 97-103 MG PO TABS: 1.0000 | ORAL_TABLET | Freq: Two times a day (BID) | ORAL | 0 refills | Status: DC

## 2021-11-06 NOTE — Progress Notes (Signed)
Cardiology Office Note   Date:  11/09/2021   ID:  Gordon Madden, DOB January 13, 1951, MRN [id number]  PCP:  Kelton Pillar, MD    No chief complaint on file.  CAD  Wt Readings from Last 3 Encounters:  11/09/21 (!) 308 lb 6.4 oz (139.9 kg)  06/22/21 (!) 309 lb (140.2 kg)  02/02/21 (!) 315 lb (142.9 kg)       History of Present Illness: Gordon Madden is a 70 y.o. male   with history of CAD, Status post MI in 2004 and found to have a totally occluded RCA at [facility] in [place]. Last stress Myoview 05/2014 low risk with large inferior lateral scar and minimal peri-infarct ischemia EF 35% with inferolateral akinesis/mild dyskinesis. Felt to be low risk. 2-D echo 05/2014 EF 40-40%. chronic systolic CHF with EF 01-60%, diabetes mellitus, CVA, gout, HTN, HLD, OSA, BPH, morbid obesity, chronic DOE, chronic thrombocytopenia, arthritis, mildly dilated aorta.   Patient saw [name]/PA-C 07/10/17 for surgical clearance before undergoing right knee surgery. He was having worsening shortness of breath. 2-D echo shows LVEF 35-40% with global hypokinesis and grade 1 DD-worse than before. Nuclear stress test 07/18/17 LVEF 36% with fixed large severe basal inferior and basal to mid inferior lateral defect but no ischemia.  I reviewed and felt he could proceed with surgery and to avoid excessive IV fluids. 6/18 BNP was normal.   He had right knee surgery and tolerated it well, in 9/18.    Nosebleed in 2/19 led to aspirin being stopped.  He continues to take Plavix. No further bleeding issues since that time.    He had some procedures on ingrown toe nails.  It oozed for several months, but resolved.  Dr. [name] did this procedure.    In 2021, he lost weight.  He hired a Transport planner and this helped.  Decreased portion size.  He is exercising with the [facility] senior center.    Had some scalp tenderness in 2021.  Carotid Doppler was considered.  It did eventually resolve.  He thinks it  may have been shingles.     Past Medical History:  Diagnosis Date   Arthritis    Ascending aorta dilatation (HCC)    BPH (benign prostatic hyperplasia)    CAD (coronary artery disease)    a. MI 2004 with occ RCA.   Cardiomyopathy    EF 10%   Chronic systolic CHF (congestive heart failure) (HCC)    Colon polyps    CPAP (continuous positive airway pressure) dependence    Diabetes mellitus, type 2 (HCC)    History of gout    Hyperlipidemia    Hypertension    Idiopathic thrombocytopenia (HCC)    Morbid obesity (HCC)    Myocardial infarction (HCC)    OSA (obstructive sleep apnea)    07/25/10 AHI 14.9/hr   Pneumonia    PONV (postoperative nausea and vomiting)    Primary localized osteoarthritis of left knee 06/07/2015   Primary localized osteoarthritis of right knee 08/06/2017   Rhinosinusitis    Shortness of breath dyspnea    Stroke [facility])    right amaurosis fugax, concern for retinal artery occlusion 12/2009; cerebral arteriography 01/30/10: no occlusion, stenoses, dissections, aneurysms, AVM noted, patent opthalmic arteries   Vision decreased     Past Surgical History:  Procedure Laterality Date   CARDIAC CATHETERIZATION  2005   no stents   CHOLECYSTECTOMY N/A 02/02/2021   Procedure: LAPAROSCOPIC CHOLECYSTECTOMY;  Surgeon: Michaelle Birks  L, MD;  Location: [place];  Service: General;  Laterality: N/A;   CYST EXCISION     chest and back   HERNIA REPAIR     umbilical   JOINT REPLACEMENT     Left 06/07/15   KNEE CARTILAGE SURGERY     left   KNEE SURGERY Right 08/06/2017   MENISCUS REPAIR Left    NASAL SINUS SURGERY     PARTIAL KNEE ARTHROPLASTY Right 08/06/2017   Procedure: UNICOMPARTMENTAL KNEE;  Surgeon: [name], MD;  Location: [place];  Service: Orthopedics;  Laterality: Right;   SINUS IRRIGATION     TOTAL KNEE ARTHROPLASTY Left 06/07/2015   Procedure: TOTAL KNEE ARTHROPLASTY;  Surgeon: [name], MD;  Location: [place];  Service: Orthopedics;  Laterality: Left;    UMBILICAL HERNIA REPAIR       Current Outpatient Medications  Medication Sig Dispense Refill   carvedilol (COREG) 6.25 MG tablet TAKE 1 TABLET TWICE DAILY 180 tablet 3   clopidogrel (PLAVIX) 75 MG tablet Take 75 mg by mouth daily.     Coenzyme Q10 (CO Q 10) 100 MG CAPS Take 300 mg by mouth daily.     diclofenac (VOLTAREN) 75 MG EC tablet Take 1 tablet (75 mg total) by mouth 2 (two) times daily. 50 tablet 2   fexofenadine (ALLEGRA) 180 MG tablet Take 180 mg by mouth daily.     folic acid (FOLVITE) 628 MCG tablet Take 400 mcg by mouth daily.     ibuprofen (ADVIL,MOTRIN) 200 MG tablet Take 600 mg by mouth daily.     metFORMIN (GLUCOPHAGE) 500 MG tablet Take 500 mg by mouth daily with breakfast.     PFIZER-BIONT COVID-19 VAC-TRIS SUSP injection      pravastatin (PRAVACHOL) 40 MG tablet Take 40 mg by mouth daily.     sacubitril-valsartan (ENTRESTO) 97-103 MG Take 1 tablet by mouth 2 (two) times daily. Please keep upcoming appt in December 2022 with Dr. Irish Lack before anymore refills. Thank you 180 tablet 0   tamsulosin (FLOMAX) 0.4 MG CAPS capsule 1 capsule     vitamin B-12 (CYANOCOBALAMIN) 1000 MCG tablet Take 1,000 mcg by mouth daily.     No current facility-administered medications for this visit.    Allergies:   Plavix [clopidogrel]    Social History:  The patient  reports that he quit smoking about 4 years ago. His smoking use included cigars and cigarettes. He has a 60.00 pack-year smoking history. He has never used smokeless tobacco. He reports current alcohol use. He reports that he does not use drugs.   Family History:  The patient's family history includes Hypertension in his father.    ROS:  Please see the history of present illness.   Otherwise, review of systems are positive for maintaining weight loss.   All other systems are reviewed and negative.    PHYSICAL EXAM: VS:  BP 102/70   Pulse 70   Ht 5\' 11"  (1.803 m)   Wt (!) 308 lb 6.4 oz (139.9 kg)   SpO2 96%   BMI  43.01 kg/m  , BMI Body mass index is 43.01 kg/m. GEN: Well nourished, well developed, in no acute distress HEENT: normal Neck: no JVD, carotid bruits, or masses Cardiac: RRR; no murmurs, rubs, or gallops,no edema  Respiratory:  clear to auscultation bilaterally, normal work of breathing GI: soft, nontender, nondistended, + BS MS: no deformity or atrophy Skin: warm and dry, no rash Neuro:  Strength and sensation are intact Psych: euthymic mood,  full affect   EKG:   The ekg ordered today demonstrates normal sinus rhythm, low voltage, IVCD, no acute ST segment changes   Recent Labs: 02/02/2021: BUN 18; Creatinine, Ser 0.80; Hemoglobin 16.6; Platelets 91; Potassium 4.5; Sodium 138   Lipid Panel No results found for: CHOL, TRIG, HDL, CHOLHDL, VLDL, LDLCALC, LDLDIRECT   Other studies Reviewed: Additional studies/ records that were reviewed today with results demonstrating: .   ASSESSMENT AND PLAN:  CAD: Noted by stress test. On Plavix.  Mild thrombocytopenia.  No angina.  Continue aggressive secondary prevention.  Check CBC in 3 months. Chronic systolic heart failure: Appears euvolemic.  Doing well with Entresto.  EF had dropped in 2018.  Medical therapy was intensified.  I suspect he has had some negative remodeling since his MI. HTN: The current medical regimen is effective;  continue present plan and medications. Tobacco abuse: Daily cigar use.  He is not interested in quitting. Mitral regurgitation: No CHF sx. mild MR noted in 2018. Hyperlipidemia: LDL 70 on pravastatin. High potency statin indicated. Stop pravastatin. Start rosuvastatin 20 mg daily.  Recheck labs in 3 months. DM: A1C 5.9. Exercising in arthritis exercise programs.  Foot pain has limited activity.  Decreased metformin dose.  Morbid obesity: weight loss with decreased portion sizes.  He did get to less than 300 pounds.  Holidays have led to some weight gain.   Current medicines are reviewed at length with the  patient today.  The patient concerns regarding his medicines were addressed.  The following changes have been made:  No change  Labs/ tests ordered today include:  No orders of the defined types were placed in this encounter.   Recommend 150 minutes/week of aerobic exercise Low fat, low carb, high fiber diet recommended  Disposition:   FU in 1 year   Signed, Larae Grooms, MD  11/09/2021 9:37 AM    [facility] [place], [place], [place]  [address] Phone: [phone]; Fax: [phone]

## 2021-11-08 DIAGNOSIS — H02834 Dermatochalasis of left upper eyelid: Secondary | ICD-10-CM | POA: Diagnosis not present

## 2021-11-08 DIAGNOSIS — H04123 Dry eye syndrome of bilateral lacrimal glands: Secondary | ICD-10-CM | POA: Diagnosis not present

## 2021-11-08 DIAGNOSIS — H43812 Vitreous degeneration, left eye: Secondary | ICD-10-CM | POA: Diagnosis not present

## 2021-11-08 DIAGNOSIS — H34231 Retinal artery branch occlusion, right eye: Secondary | ICD-10-CM | POA: Diagnosis not present

## 2021-11-08 DIAGNOSIS — H2513 Age-related nuclear cataract, bilateral: Secondary | ICD-10-CM | POA: Diagnosis not present

## 2021-11-08 DIAGNOSIS — H2181 Floppy iris syndrome: Secondary | ICD-10-CM | POA: Diagnosis not present

## 2021-11-08 DIAGNOSIS — H5703 Miosis: Secondary | ICD-10-CM | POA: Diagnosis not present

## 2021-11-08 DIAGNOSIS — E119 Type 2 diabetes mellitus without complications: Secondary | ICD-10-CM | POA: Diagnosis not present

## 2021-11-08 DIAGNOSIS — H02831 Dermatochalasis of right upper eyelid: Secondary | ICD-10-CM | POA: Diagnosis not present

## 2021-11-09 ENCOUNTER — Ambulatory Visit: Payer: Medicare HMO | Admitting: Interventional Cardiology

## 2021-11-09 ENCOUNTER — Other Ambulatory Visit: Payer: Self-pay

## 2021-11-09 ENCOUNTER — Encounter: Payer: Self-pay | Admitting: Interventional Cardiology

## 2021-11-09 VITALS — BP 102/70 | HR 70 | Ht 71.0 in | Wt 308.4 lb

## 2021-11-09 DIAGNOSIS — E1159 Type 2 diabetes mellitus with other circulatory complications: Secondary | ICD-10-CM

## 2021-11-09 DIAGNOSIS — I10 Essential (primary) hypertension: Secondary | ICD-10-CM | POA: Diagnosis not present

## 2021-11-09 DIAGNOSIS — E782 Mixed hyperlipidemia: Secondary | ICD-10-CM

## 2021-11-09 DIAGNOSIS — I25118 Atherosclerotic heart disease of native coronary artery with other forms of angina pectoris: Secondary | ICD-10-CM | POA: Diagnosis not present

## 2021-11-09 DIAGNOSIS — I5022 Chronic systolic (congestive) heart failure: Secondary | ICD-10-CM | POA: Diagnosis not present

## 2021-11-09 DIAGNOSIS — I340 Nonrheumatic mitral (valve) insufficiency: Secondary | ICD-10-CM

## 2021-11-09 DIAGNOSIS — F17200 Nicotine dependence, unspecified, uncomplicated: Secondary | ICD-10-CM | POA: Diagnosis not present

## 2021-11-09 MED ORDER — ENTRESTO 97-103 MG PO TABS: 1.0000 | ORAL_TABLET | Freq: Two times a day (BID) | ORAL | 3 refills | Status: DC

## 2021-11-09 MED ORDER — ROSUVASTATIN CALCIUM 20 MG PO TABS: 20.0000 mg | ORAL_TABLET | Freq: Every day | ORAL | 3 refills | Status: DC

## 2021-11-09 NOTE — Patient Instructions (Signed)
Medication Instructions:  Your physician has recommended you make the following change in your medication: Stop Pravastatin. Start Rosuvastatin 20 mg by mouth daily   *If you need a refill on your cardiac medications before your next appointment, please call your pharmacy*   Lab Work: Your physician recommends that you return for lab work in: 3 months. Scheduled for February 07, 2022.  Lipid, liver and CBC.  This will be fasting.  The lab opens at 7:30 AM  If you have labs (blood work) drawn today and your tests are completely normal, you will receive your results only by: [place] (if you have MyChart) OR A paper copy in the mail If you have any lab test that is abnormal or we need to change your treatment, we will call you to review the results.   Testing/Procedures: none   Follow-Up: At [facility], you and your health needs are our priority.  As part of our continuing mission to provide you with exceptional heart care, we have created designated Provider Care Teams.  These Care Teams include your primary Cardiologist (physician) and Advanced Practice Providers (APPs -  Physician Assistants and Nurse Practitioners) who all work together to provide you with the care you need, when you need it.  We recommend signing up for the patient portal called "MyChart".  Sign up information is provided on this After Visit Summary.  MyChart is used to connect with patients for Virtual Visits (Telemedicine).  Patients are able to view lab/test results, encounter notes, upcoming appointments, etc.  Non-urgent messages can be sent to your provider as well.   To learn more about what you can do with MyChart, go to [email or web address].    Your next appointment:   12 month(s)  The format for your next appointment:   In Person  Provider:   Larae Grooms, MD     Other Instructions

## 2021-12-01 DIAGNOSIS — R350 Frequency of micturition: Secondary | ICD-10-CM | POA: Diagnosis not present

## 2021-12-01 DIAGNOSIS — N401 Enlarged prostate with lower urinary tract symptoms: Secondary | ICD-10-CM | POA: Diagnosis not present

## 2021-12-14 DIAGNOSIS — H6123 Impacted cerumen, bilateral: Secondary | ICD-10-CM | POA: Diagnosis not present

## 2021-12-28 DIAGNOSIS — H2511 Age-related nuclear cataract, right eye: Secondary | ICD-10-CM | POA: Diagnosis not present

## 2021-12-28 DIAGNOSIS — H5703 Miosis: Secondary | ICD-10-CM | POA: Diagnosis not present

## 2022-01-07 DIAGNOSIS — H2512 Age-related nuclear cataract, left eye: Secondary | ICD-10-CM | POA: Diagnosis not present

## 2022-01-11 DIAGNOSIS — H2512 Age-related nuclear cataract, left eye: Secondary | ICD-10-CM | POA: Diagnosis not present

## 2022-01-23 ENCOUNTER — Other Ambulatory Visit: Payer: Self-pay | Admitting: Interventional Cardiology

## 2022-02-07 ENCOUNTER — Other Ambulatory Visit: Payer: Self-pay

## 2022-02-07 ENCOUNTER — Other Ambulatory Visit: Payer: Medicare HMO | Admitting: *Deleted

## 2022-02-07 DIAGNOSIS — I340 Nonrheumatic mitral (valve) insufficiency: Secondary | ICD-10-CM | POA: Diagnosis not present

## 2022-02-07 DIAGNOSIS — I5022 Chronic systolic (congestive) heart failure: Secondary | ICD-10-CM

## 2022-02-07 DIAGNOSIS — I25118 Atherosclerotic heart disease of native coronary artery with other forms of angina pectoris: Secondary | ICD-10-CM | POA: Diagnosis not present

## 2022-02-07 DIAGNOSIS — E782 Mixed hyperlipidemia: Secondary | ICD-10-CM

## 2022-02-07 LAB — HEPATIC FUNCTION PANEL
ALT: 12 IU/L (ref 0–44)
AST: 15 IU/L (ref 0–40)
Albumin: 4.4 g/dL (ref 3.7–4.7)
Alkaline Phosphatase: 58 IU/L (ref 44–121)
Bilirubin Total: 0.7 mg/dL (ref 0.0–1.2)
Bilirubin, Direct: 0.2 mg/dL (ref 0.00–0.40)
Total Protein: 5.9 g/dL — ABNORMAL LOW (ref 6.0–8.5)

## 2022-02-07 LAB — LIPID PANEL
Chol/HDL Ratio: 2.4 ratio (ref 0.0–5.0)
Cholesterol, Total: 104 mg/dL (ref 100–199)
HDL: 44 mg/dL (ref >39)
LDL Chol Calc (NIH): 38 mg/dL (ref 0–99)
Triglycerides: 123 mg/dL (ref 0–149)
VLDL Cholesterol Cal: 22 mg/dL (ref 5–40)

## 2022-02-07 LAB — CBC
Hematocrit: 47.3 % (ref 37.5–51.0)
Hemoglobin: 16.2 g/dL (ref 13.0–17.7)
MCH: 31.6 pg (ref 26.6–33.0)
MCHC: 34.2 g/dL (ref 31.5–35.7)
MCV: 92 fL (ref 79–97)
Platelets: 79 10*3/uL — CL (ref 150–450)
RBC: 5.13 x10E6/uL (ref 4.14–5.80)
RDW: 13 % (ref 11.6–15.4)
WBC: 5.6 10*3/uL (ref 3.4–10.8)

## 2022-02-19 DIAGNOSIS — M19011 Primary osteoarthritis, right shoulder: Secondary | ICD-10-CM | POA: Diagnosis not present

## 2022-02-26 ENCOUNTER — Telehealth: Payer: Self-pay | Admitting: Internal Medicine

## 2022-02-26 DIAGNOSIS — G4733 Obstructive sleep apnea (adult) (pediatric): Secondary | ICD-10-CM

## 2022-02-26 NOTE — Telephone Encounter (Signed)
Called and spoke with patient. He stated that he would like to have an order sent to [facility] for a new cpap machine. His current machine has not worked in 6 months. An order was sent on 06/22/21 but was rejected by [facility] due to him not being eligible for a new machine until March 2023.  ? ?It looks like his current machine is set at 8-12cm. ? ?Dr. Annamaria Boots, please advise if you are ok with [place] ordering the cpap machine for him. Thanks!  ?

## 2022-02-26 NOTE — Telephone Encounter (Signed)
Called and spoke with patient. He is aware that [place] is ok with the new cpap machine. RX has been ordered.  ? ?Nothing further needed at time of call.  ?

## 2022-02-26 NOTE — Telephone Encounter (Signed)
Yes thanks- please order replacement CPAP, current setting, mask of choice, humidifier, supplies, AirView/ cardd ?

## 2022-04-16 DIAGNOSIS — M19011 Primary osteoarthritis, right shoulder: Secondary | ICD-10-CM | POA: Diagnosis not present

## 2022-04-16 DIAGNOSIS — M25561 Pain in right knee: Secondary | ICD-10-CM | POA: Diagnosis not present

## 2022-04-17 DIAGNOSIS — D696 Thrombocytopenia, unspecified: Secondary | ICD-10-CM | POA: Diagnosis not present

## 2022-04-17 DIAGNOSIS — E782 Mixed hyperlipidemia: Secondary | ICD-10-CM | POA: Diagnosis not present

## 2022-04-17 DIAGNOSIS — I429 Cardiomyopathy, unspecified: Secondary | ICD-10-CM | POA: Diagnosis not present

## 2022-04-17 DIAGNOSIS — I119 Hypertensive heart disease without heart failure: Secondary | ICD-10-CM | POA: Diagnosis not present

## 2022-04-17 DIAGNOSIS — I2510 Atherosclerotic heart disease of native coronary artery without angina pectoris: Secondary | ICD-10-CM | POA: Diagnosis not present

## 2022-04-17 DIAGNOSIS — N181 Chronic kidney disease, stage 1: Secondary | ICD-10-CM | POA: Diagnosis not present

## 2022-04-17 DIAGNOSIS — E1121 Type 2 diabetes mellitus with diabetic nephropathy: Secondary | ICD-10-CM | POA: Diagnosis not present

## 2022-04-17 DIAGNOSIS — K760 Fatty (change of) liver, not elsewhere classified: Secondary | ICD-10-CM | POA: Diagnosis not present

## 2022-04-18 DIAGNOSIS — N401 Enlarged prostate with lower urinary tract symptoms: Secondary | ICD-10-CM | POA: Diagnosis not present

## 2022-04-18 DIAGNOSIS — R3121 Asymptomatic microscopic hematuria: Secondary | ICD-10-CM | POA: Diagnosis not present

## 2022-04-18 DIAGNOSIS — R350 Frequency of micturition: Secondary | ICD-10-CM | POA: Diagnosis not present

## 2022-04-30 DIAGNOSIS — M19011 Primary osteoarthritis, right shoulder: Secondary | ICD-10-CM | POA: Diagnosis not present

## 2022-05-07 DIAGNOSIS — N200 Calculus of kidney: Secondary | ICD-10-CM | POA: Diagnosis not present

## 2022-05-07 DIAGNOSIS — R3121 Asymptomatic microscopic hematuria: Secondary | ICD-10-CM | POA: Diagnosis not present

## 2022-05-07 DIAGNOSIS — N400 Enlarged prostate without lower urinary tract symptoms: Secondary | ICD-10-CM | POA: Diagnosis not present

## 2022-05-12 DIAGNOSIS — R040 Epistaxis: Secondary | ICD-10-CM | POA: Diagnosis not present

## 2022-05-12 DIAGNOSIS — I10 Essential (primary) hypertension: Secondary | ICD-10-CM | POA: Diagnosis not present

## 2022-05-23 DIAGNOSIS — R040 Epistaxis: Secondary | ICD-10-CM | POA: Diagnosis not present

## 2022-05-24 ENCOUNTER — Encounter (HOSPITAL_BASED_OUTPATIENT_CLINIC_OR_DEPARTMENT_OTHER): Payer: Self-pay

## 2022-05-24 ENCOUNTER — Encounter: Payer: Self-pay | Admitting: Interventional Cardiology

## 2022-05-24 ENCOUNTER — Emergency Department (HOSPITAL_BASED_OUTPATIENT_CLINIC_OR_DEPARTMENT_OTHER)
Admission: EM | Admit: 2022-05-24 | Discharge: 2022-05-24 | Disposition: A | Discharge status: Home / Self Care | Payer: Medicare HMO | Attending: Emergency Medicine | Admitting: Emergency Medicine

## 2022-05-24 DIAGNOSIS — I110 Hypertensive heart disease with heart failure: Secondary | ICD-10-CM | POA: Insufficient documentation

## 2022-05-24 DIAGNOSIS — R040 Epistaxis: Secondary | ICD-10-CM | POA: Diagnosis not present

## 2022-05-24 DIAGNOSIS — I509 Heart failure, unspecified: Secondary | ICD-10-CM | POA: Insufficient documentation

## 2022-05-24 DIAGNOSIS — Z96652 Presence of left artificial knee joint: Secondary | ICD-10-CM | POA: Insufficient documentation

## 2022-05-24 DIAGNOSIS — E119 Type 2 diabetes mellitus without complications: Secondary | ICD-10-CM | POA: Diagnosis not present

## 2022-05-24 DIAGNOSIS — I2510 Atherosclerotic heart disease of native coronary artery without angina pectoris: Secondary | ICD-10-CM | POA: Insufficient documentation

## 2022-05-24 DIAGNOSIS — Z87891 Personal history of nicotine dependence: Secondary | ICD-10-CM | POA: Diagnosis not present

## 2022-05-24 DIAGNOSIS — Z7901 Long term (current) use of anticoagulants: Secondary | ICD-10-CM | POA: Diagnosis not present

## 2022-05-24 DIAGNOSIS — R3121 Asymptomatic microscopic hematuria: Secondary | ICD-10-CM | POA: Diagnosis not present

## 2022-05-24 NOTE — ED Notes (Signed)
Applied nose clip to control bleeding.

## 2022-05-24 NOTE — ED Notes (Signed)
No bleeding after removal of nose clip

## 2022-05-24 NOTE — ED Provider Notes (Signed)
[facility] Emergency Department Provider Note MRN:  [id number]  Arrival date & time: 05/24/22     Chief Complaint   Epistaxis   History of Present Illness   Gordon Madden is a 71 y.o. year-old male with a history of CAD, cardiomyopathy, diabetes presenting to the ED with chief complaint of epistaxis.  Persistent left nostril bleeding for a few hours today.  No trauma, no recent illness, no other symptoms.  Takes Plavix.  Review of Systems  A thorough review of systems was obtained and all systems are negative except as noted in the HPI and PMH.   Patient's Health History    Past Medical History:  Diagnosis Date   Arthritis    Ascending aorta dilatation (HCC)    BPH (benign prostatic hyperplasia)    CAD (coronary artery disease)    a. MI 2004 with occ RCA.   Cardiomyopathy    EF 19%   Chronic systolic CHF (congestive heart failure) (HCC)    Colon polyps    CPAP (continuous positive airway pressure) dependence    Diabetes mellitus, type 2 (HCC)    History of gout    Hyperlipidemia    Hypertension    Idiopathic thrombocytopenia (HCC)    Morbid obesity (HCC)    Myocardial infarction (HCC)    OSA (obstructive sleep apnea)    07/25/10 AHI 14.9/hr   Pneumonia    PONV (postoperative nausea and vomiting)    Primary localized osteoarthritis of left knee 06/07/2015   Primary localized osteoarthritis of right knee 08/06/2017   Rhinosinusitis    Shortness of breath dyspnea    Stroke [facility])    right amaurosis fugax, concern for retinal artery occlusion 12/2009; cerebral arteriography 01/30/10: no occlusion, stenoses, dissections, aneurysms, AVM noted, patent opthalmic arteries   Vision decreased     Past Surgical History:  Procedure Laterality Date   CARDIAC CATHETERIZATION  2005   no stents   CHOLECYSTECTOMY N/A 02/02/2021   Procedure: LAPAROSCOPIC CHOLECYSTECTOMY;  Surgeon: [name], MD;  Location: [place];  Service: General;  Laterality: N/A;    CYST EXCISION     chest and back   HERNIA REPAIR     umbilical   JOINT REPLACEMENT     Left 06/07/15   KNEE CARTILAGE SURGERY     left   KNEE SURGERY Right 08/06/2017   MENISCUS REPAIR Left    NASAL SINUS SURGERY     PARTIAL KNEE ARTHROPLASTY Right 08/06/2017   Procedure: UNICOMPARTMENTAL KNEE;  Surgeon: [name], MD;  Location: [place];  Service: Orthopedics;  Laterality: Right;   SINUS IRRIGATION     TOTAL KNEE ARTHROPLASTY Left 06/07/2015   Procedure: TOTAL KNEE ARTHROPLASTY;  Surgeon: [name], MD;  Location: [place];  Service: Orthopedics;  Laterality: Left;   UMBILICAL HERNIA REPAIR      Family History  Problem Relation Age of Onset   Hypertension Father    Colon cancer Neg Hx    Heart attack Neg Hx     Social History   Socioeconomic History   Marital status: Married    Spouse name: Not on file   Number of children: Not on file   Years of education: Not on file   Highest education level: Not on file  Occupational History   Occupation: Human resources officer    Employer: [employer]  Tobacco Use   Smoking status: Former    Packs/day: 3.00    Years: 20.00    Total pack  years: 60.00    Types: Cigars, Cigarettes    Quit date: 11/26/2016    Years since quitting: 5.4   Smokeless tobacco: Never   Tobacco comments:    stopped smoking cigarettes in 1991, smoked cigars from 2000 to Jan 2018, 1 cigar per day  Vaping Use   Vaping Use: Never used  Substance and Sexual Activity   Alcohol use: Yes    Comment: occassionally   Drug use: No   Sexual activity: Not on file  Other Topics Concern   Not on file  Social History Narrative   ** Merged History Encounter **       Social Determinants of Health   Financial Resource Strain: Not on file  Food Insecurity: Not on file  Transportation Needs: Not on file  Physical Activity: Not on file  Stress: Not on file  Social Connections: Not on file  Intimate Partner Violence: Not on file     Physical Exam    Vitals:   05/24/22 0036  BP: (!) 147/121  Pulse: 77  Resp: 18  Temp: 98.1 F (36.7 C)  SpO2: 99%    CONSTITUTIONAL: Well-appearing, NAD NEURO/PSYCH:  Alert and oriented x 3, no focal deficits EYES:  eyes equal and reactive ENT/NECK:  no LAD, no JVD CARDIO: Regular rate, well-perfused, normal S1 and S2 PULM:  CTAB no wheezing or rhonchi GI/GU:  non-distended, non-tender MSK/SPINE:  No gross deformities, no edema SKIN:  no rash, atraumatic   *Additional and/or pertinent findings included in MDM below  Diagnostic and Interventional Summary    EKG Interpretation  Date/Time:    Ventricular Rate:    PR Interval:    QRS Duration:   QT Interval:    QTC Calculation:   R Axis:     Text Interpretation:         Labs Reviewed - No data to display  No orders to display    Medications - No data to display   Procedures  /  Critical Care Procedures  ED Course and Medical Decision Making  Initial Impression and Ddx On my initial assessment patient's hemostatic.  No signs of active bleeding within the nostrils.  He explains that while in triage they placed a nose clip to provide consistent pressure and this stopped the bleeding.  He is now appropriate for discharge.  Past medical/surgical history that increases complexity of ED encounter: CAD, anticoagulated on Plavix  Interpretation of Diagnostics Laboratory and/or imaging options to aid in the diagnosis/care of the patient were considered.  After careful history and physical examination, it was determined that there was no indication for diagnostics at this time.  Patient Reassessment and Ultimate Disposition/Management     Discharge home  Patient management required discussion with the following services or consulting groups:  None  Complexity of Problems Addressed Acute complicated illness or Injury  Additional Data Reviewed and Analyzed Further history obtained from: Further history from spouse/family  member  Additional Factors Impacting ED Encounter Risk None  Barth Kirks. [place]onia Small, [place] [email or web address]  Final Clinical Impressions(s) / ED Diagnoses     ICD-10-CM   1. Epistaxis  R04.0       ED Discharge Orders     None        Discharge Instructions Discussed with and Provided to Patient:    Discharge Instructions      You were evaluated in the Emergency Department and after careful evaluation, we did not find any emergent  condition requiring admission or further testing in the hospital.  Your exam/testing today was overall reassuring.  Please return to the Emergency Department if you experience any worsening of your condition.  Thank you for allowing [place] to be a part of your care.       Maudie Flakes, MD 05/24/22 [phone]

## 2022-05-24 NOTE — Discharge Instructions (Signed)
You were evaluated in the Emergency Department and after careful evaluation, we did not find any emergent condition requiring admission or further testing in the hospital.  Your exam/testing today was overall reassuring.  Please return to the Emergency Department if you experience any worsening of your condition.  Thank you for allowing us to be a part of your care.  

## 2022-05-24 NOTE — ED Notes (Signed)
Bleeding controlled with nose clip. Pt continues to wait in lobby for next available room

## 2022-05-24 NOTE — ED Triage Notes (Addendum)
Nosebleeds started 2 weeks ago 1 week ago to stop bleeding had to be cauterized 3 nosebleeds yesterday  Started 6/28 2330 and he can not get it to stop Lg clots noted in triage appears to be bleeding from left nare Nose clip in place Pt is not on any blood thinners

## 2022-05-24 NOTE — ED Notes (Signed)
Discharge instructions were discussed with pt. Pt verbalized understanding with no questions at this time.

## 2022-05-28 ENCOUNTER — Other Ambulatory Visit: Payer: Self-pay

## 2022-05-28 ENCOUNTER — Encounter (HOSPITAL_COMMUNITY): Payer: Self-pay | Admitting: Pharmacy Technician

## 2022-05-28 ENCOUNTER — Emergency Department (HOSPITAL_COMMUNITY)
Admission: EM | Admit: 2022-05-28 | Discharge: 2022-05-28 | Disposition: A | Discharge status: Home / Self Care | Payer: Medicare HMO | Attending: Emergency Medicine | Admitting: Emergency Medicine

## 2022-05-28 DIAGNOSIS — Z79899 Other long term (current) drug therapy: Secondary | ICD-10-CM | POA: Insufficient documentation

## 2022-05-28 DIAGNOSIS — T8484XA Pain due to internal orthopedic prosthetic devices, implants and grafts, initial encounter: Secondary | ICD-10-CM | POA: Diagnosis not present

## 2022-05-28 DIAGNOSIS — R040 Epistaxis: Secondary | ICD-10-CM

## 2022-05-28 DIAGNOSIS — Z7984 Long term (current) use of oral hypoglycemic drugs: Secondary | ICD-10-CM | POA: Insufficient documentation

## 2022-05-28 DIAGNOSIS — Z7902 Long term (current) use of antithrombotics/antiplatelets: Secondary | ICD-10-CM | POA: Diagnosis not present

## 2022-05-28 DIAGNOSIS — M19011 Primary osteoarthritis, right shoulder: Secondary | ICD-10-CM | POA: Diagnosis not present

## 2022-05-28 LAB — CBC
HCT: 46.3 % (ref 39.0–52.0)
Hemoglobin: 15.5 g/dL (ref 13.0–17.0)
MCH: 31.6 pg (ref 26.0–34.0)
MCHC: 33.5 g/dL (ref 30.0–36.0)
MCV: 94.5 fL (ref 80.0–100.0)
Platelets: 90 10*3/uL — ABNORMAL LOW (ref 150–400)
RBC: 4.9 MIL/uL (ref 4.22–5.81)
RDW: 13.2 % (ref 11.5–15.5)
WBC: 5.9 10*3/uL (ref 4.0–10.5)
nRBC: 0 % (ref 0.0–0.2)

## 2022-05-28 LAB — BASIC METABOLIC PANEL
Anion gap: 10 (ref 5–15)
BUN: 18 mg/dL (ref 8–23)
CO2: 25 mmol/L (ref 22–32)
Calcium: 9.2 mg/dL (ref 8.9–10.3)
Chloride: 104 mmol/L (ref 98–111)
Creatinine, Ser: 0.95 mg/dL (ref 0.61–1.24)
GFR, Estimated: >60 mL/min (ref >60)
Glucose, Bld: 133 mg/dL — ABNORMAL HIGH (ref 70–99)
Potassium: 4.3 mmol/L (ref 3.5–5.1)
Sodium: 139 mmol/L (ref 135–145)

## 2022-05-28 LAB — PROTIME-INR
INR: 1.1 (ref 0.8–1.2)
Prothrombin Time: 14.5 seconds (ref 11.4–15.2)

## 2022-05-28 LAB — APTT: aPTT: 28 seconds (ref 24–36)

## 2022-05-28 MED ORDER — OXYMETAZOLINE HCL 0.05 % NA SOLN
1.0000 | Freq: Once | NASAL | Status: AC
  Administered 2022-05-28: 1 via NASAL

## 2022-05-28 NOTE — ED Provider Notes (Signed)
[facility] EMERGENCY DEPARTMENT Provider Note   CSN: [id number] Arrival date & time: 05/28/22  1340     History  Chief Complaint  Patient presents with   Epistaxis    Gordon Madden is a 71 y.o. male.  71 yo M with a cc of left-sided epistaxis.  This has been off and on for a few weeks now.  Has now been seen in 3 different emergency departments for the same.  Had cautery when he was in [place] and then was seen about a week ago and had resolved with direct pressure and similar today.  The patient is concerned that he may have recurrent bleeding.  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  Denies any trauma.  Seems occur with simple head movements and he feels like it is occurring more frequently now.   Epistaxis      Home Medications Prior to Admission medications   Medication Sig Start Date End Date Taking? Authorizing Provider  carvedilol (COREG) 6.25 MG tablet TAKE 1 TABLET TWICE DAILY 10/25/21   Jettie Booze, MD  clopidogrel (PLAVIX) 75 MG tablet Take 75 mg by mouth daily. 02/04/18   [provider]  Coenzyme Q10 (CO Q 10) 100 MG CAPS Take 300 mg by mouth daily.    [provider]  diclofenac (VOLTAREN) 75 MG EC tablet Take 1 tablet (75 mg total) by mouth 2 (two) times daily. 04/12/21   Regal, Tamala Fothergill, DPM  ENTRESTO 97-103 MG TAKE 1 TABLET TWICE DAILY. KEEP UPCOMING APPT IN DECEMBER 2022 WITH DR. VARANASI BEFORE ANYMORE REFILLS. 01/23/22   Jettie Booze, MD  fexofenadine (ALLEGRA) 180 MG tablet Take 180 mg by mouth daily.    [provider]  folic acid (FOLVITE) 119 MCG tablet Take 400 mcg by mouth daily.    [provider]  ibuprofen (ADVIL,MOTRIN) 200 MG tablet Take 600 mg by mouth daily.    [provider]  metFORMIN (GLUCOPHAGE) 500 MG tablet Take 500 mg by mouth daily with breakfast.    [provider]  PFIZER-BIONT COVID-19 VAC-TRIS SUSP injection  03/02/21   [provider]  rosuvastatin (CRESTOR) 20 MG tablet Take 1 tablet (20 mg total) by mouth daily. 11/09/21   Jettie Booze, MD  tamsulosin [facility]) 0.4 MG CAPS capsule 1 capsule 02/21/21   [provider]  vitamin B-12 (CYANOCOBALAMIN) 1000 MCG tablet Take 1,000 mcg by mouth daily.    [provider]      Allergies    Plavix [clopidogrel]    Review of Systems   Review of Systems  HENT:  Positive for nosebleeds.     Physical Exam Updated Vital Signs BP (!) 150/102   Pulse 68   Temp 97.8 F (36.6 C)   Resp 18   SpO2 97%  Physical Exam Vitals and nursing note reviewed.  Constitutional:      Appearance: He is well-developed.  HENT:     Head: Normocephalic and atraumatic.     Nose:     Comments: No apparent bleeding.  No hypervascularity to Kiesselbach's plexus bilaterally.  Some blood in the posterior oropharynx without any active bleeding. Eyes:     Pupils: Pupils are equal, round, and reactive to light.  Neck:     Vascular: No JVD.  Cardiovascular:     Rate and Rhythm: Normal rate and regular rhythm.     Heart sounds: No murmur heard.    No  friction rub. No gallop.  Pulmonary:     Effort: No respiratory distress.     Breath sounds: No wheezing.  Abdominal:     General: There is no distension.     Tenderness: There is no abdominal tenderness. There is no guarding or rebound.  Musculoskeletal:        General: Normal range of motion.     Cervical back: Normal range of motion and neck supple.  Skin:    Coloration: Skin is not pale.     Findings: No rash.  Neurological:     Mental Status: He is alert and oriented to person, place, and time.     Deep Tendon Reflexes: Reflexes normal.  Psychiatric:        Behavior: Behavior normal.     ED Results / Procedures / Treatments   Labs (all labs ordered are listed, but only abnormal results are displayed) Labs Reviewed  CBC - Abnormal; Notable for the following components:      Result Value    Platelets 90 (*)    All other components within normal limits  BASIC METABOLIC PANEL - Abnormal; Notable for the following components:   Glucose, Bld 133 (*)    All other components within normal limits  PROTIME-INR  APTT    EKG None  Radiology No results found.  Procedures Procedures    Medications Ordered in ED Medications  oxymetazoline (AFRIN) 0.05 % nasal spray 1 spray (1 spray Each Nare Given 05/28/22 1424)    ED Course/ Medical Decision Making/ A&P                           Medical Decision Making  87 yoM with a chief complaints of recurrent epistaxis.  This has been going on for couple weeks now.  Has been seen in the emergency department setting now 3 times.  The patient's bleeding has been controlled with nose clips out in the waiting room.  He is a bit frustrated and concerned that he may bleed in the middle the night.  He is also bit frustrated that it is going to take some time to see ENT in the office.  I will discuss the case with ENT.  Patient's platelets at 90 which appears to be at his baseline.  No significant electrolyte abnormality no anemia  I discussed case with Dr. Fredric Dine, recommended good nasal hygiene and felt to have the patient call the office first thing on Wednesday should be seen this week.  6:54 PM:  I have discussed the diagnosis/risks/treatment options with the patient.  Evaluation and diagnostic testing in the emergency department does not suggest an emergent condition requiring admission or immediate intervention beyond what has been performed at this time.  They will follow up with ENT. We also discussed returning to the ED immediately if new or worsening sx occur. We discussed the sx which are most concerning (e.g., sudden worsening pain, fever, inability to tolerate by mouth) that necessitate immediate return. Medications administered to the patient during their visit and any new prescriptions provided to the patient are listed  below.  Medications given during this visit Medications  oxymetazoline (AFRIN) 0.05 % nasal spray 1 spray (1 spray Each Nare Given 05/28/22 1424)     The patient appears reasonably screen and/or stabilized for discharge and I doubt any other medical condition or other [facility] requiring further screening, evaluation, or treatment in the ED at this time prior to discharge.  Final Clinical Impression(s) / ED Diagnoses Final diagnoses:  Left-sided epistaxis    Rx / DC Orders ED Discharge Orders     None         Deno Etienne, DO 05/28/22 1854

## 2022-05-28 NOTE — ED Provider Triage Note (Signed)
Emergency Medicine Provider Triage Evaluation Note  Gordon Madden , a 71 y.o. male  was evaluated in triage.  Pt complains of multiple nosebleeds for the last week and a half. Hx ITP. Discontinued his own plavix on Saturday. Was not able to talk to his PCP. Hx of stroke, ACS. Noseclip in place during triage. Patient with some bleeding down back of throat.  Review of Systems  Positive: Nosebleed Negative: Lightheadedness  Physical Exam  BP (!) 188/113 (BP Location: Right Arm)   Pulse 79   Temp 97.8 F (36.6 C)   Resp 16   SpO2 95%  Gen:   Awake, no distress   Resp:  Normal effort  MSK:   Moves extremities without difficulty  Other:  Nose clip in place, bloody gauze noted. Otherwise no acute distress.  Medical Decision Making  Medically screening exam initiated at 2:15 PM.  Appropriate orders placed.  Gordon Madden was informed that the remainder of the evaluation will be completed by another provider, this initial triage assessment does not replace that evaluation, and the importance of remaining in the ED until their evaluation is complete.  Workup initiated   Gordon Madden, [place] 05/28/22 1417

## 2022-05-28 NOTE — ED Triage Notes (Addendum)
Pt here with reports of multiple nose bleeds over the last few weeks. Pt states multiple nose bleeds daily over the last week and a half. States this one started appox 1230 today. Nose clip in place. Pt with hx ITP.

## 2022-05-28 NOTE — Discharge Instructions (Signed)
Try to use saline rinses, get a humidifier for your bedroom.  Apply Vaseline to the tip of your pinky finger and apply it just on the inside of the nose twice a day.  Please try to avoid any nose blowing or touching your nose as best you can.  I spoke with the ENT provider they think if you call first thing on Wednesday they should be able to fit you in this week to be seen in the office.  If you have recurrent bleeding please hold direct pressure on the area and do not check for resolution for at least 15 minutes.  If this does not stop things then please return to the emergency department for evaluation.

## 2022-05-31 DIAGNOSIS — R040 Epistaxis: Secondary | ICD-10-CM | POA: Diagnosis not present

## 2022-06-04 ENCOUNTER — Telehealth: Payer: Self-pay | Admitting: Internal Medicine

## 2022-06-04 NOTE — Telephone Encounter (Signed)
Called patient and he states that he was having nose bleeds in the past and saw an ENT doctor and was advised to keep his nose moist to help prevent nose bleeds. Patient states he is worried that his cpap will dry his nose out and cause him to have more nose bleeds.   Please advise sir

## 2022-06-05 NOTE — Telephone Encounter (Signed)
Suggest he try putting a little otc nasal saline gel (cheap and safe) in each nostril at bedtime and anytime needed. This lasts much better than saline spray.

## 2022-06-05 NOTE — Telephone Encounter (Signed)
Called patient back and gave him the recommendations from Dr Annamaria Boots and he agreed to try that. Patient stated that his ENT doctor suggested the same thing but he wanted to clear it by Dr Annamaria Boots first. Patient states he will resume cpap tonight. And patient was advised to call us with any issues or concerns. Nothing further needed

## 2022-06-20 ENCOUNTER — Encounter: Payer: Self-pay | Admitting: Internal Medicine

## 2022-06-21 NOTE — Progress Notes (Signed)
Patient ID: Gordon Madden, male    DOB: 1950-12-20, 71 y.o.   MRN: [id number]  HPI male cigar smoker followed for OSA, insomnia, rhinitis, complicated by DM, HBP, CAD/MI, CVA/stroke NPSG 07/25/2010 AHI 14.9 per hour Office Spirometry 05/15/17-moderate restriction of exhaled volume and at least mild obstruction. FVC 2.66/55%, FEV1 2.07/58%, ratio 0.78, FEF 25-75% 1.91/69% ------------------------------------------------------------------------------------------------   06/22/21- 71 year old male former smoker (60 pk yrs) followed for OSA, insomnia, rhinitis, complicated by DM2, HTN, CAD/MI/ CM EF 36% , CVA/stroke, Depression, BPH CPAP  Auto 10-15/Adapt Download- compliance 53%, AHI 0.5/ hr Body weight today- 309 lbs      !!! Covid vax- 3 Moderna, 1 Phizer Mostly quit ETOH, helping significant weight loss. Doing well with CPAP. Left it at home on recent vacation and wife told him he only snored a little. He sleeps well with it and doesn't mind continuing. Discussed possibility with further weight loss->might do HST to see how he is doing.  06/22/22- 71 year old male former smoker (60 pk yrs) followed for OSA, insomnia, rhinitis, complicated by DM2, HTN, CAD/MI/ CM EF 36% , CVA/stroke, Depression, BPH, ITP,  CPAP  Auto 8-12/Adapt    replaced April, 2023 Download- compliance  !00% since resumption of CPAP after ccontrol of epistaxis. AHI 04/ hr. Body weight today- 312 lbs Covid vax- 3 Moderna, 1 Phizer Several ED visits for recurrent epistaxis.Platelets 90K with hx ITP. ENT [facility] 7/6>chemical cautery. Doing great now.  Epistaxis controlled by cautery. Now able to resume using CPAP and feels better with it.  Review of Systems-see HPI   + = positive Constitutional:   No-   weight loss, night sweats, fevers, chills, fatigue, lassitude. HEENT:   No-  headaches, difficulty swallowing, tooth/dental problems, sore throat,      No-sneezing, itching, ear ache, nasal congestion, post nasal drip,  CV:   +chest pain, orthopnea, PND, swelling in lower extremities, anasarca, dizziness, palpitations Resp: +  shortness of breath with exertion or at rest.              No-   productive cough,  No non-productive cough,  No-  coughing up of blood.              No-   change in color of mucus.  No- wheezing.   Skin: No-   rash or lesions. GI:  No-   heartburn, indigestion, abdominal pain, +nausea,  GU:  MS:  +joint pain or swelling. . Neuro- nothing unusual  Psych:  No- change in mood or affect. No depression or anxiety.  No memory loss.  Objective:   Physical Exam General- Alert, Oriented, Affect-appropriate, Distress- none acute;  + Morbidly obese Skin- + cool,  Lymphadenopathy- none Head- atraumatic            Eyes- Gross vision intact, PERRLA, conjunctivae clear secretions            Ears- +Hearing aids            Nose- Clear, Septal dev- not apparent from anterior., mucus, polyps, erosion, perforation             Throat- Mallampati III , mucosa clear , drainage- none, tonsils- atrophic Neck- flexible , trachea midline, no stridor , thyroid nl, carotid no bruit Chest - symmetrical excursion , unlabored           Heart/CV- RRR , no murmur , no gallop  , no rub, nl s1 s2                           -  JVD- none , edema- none, stasis changes+, varices- none           Lung- clear to P&A, wheeze- none, cough- none , dullness-none, rub- none           Chest wall-  Abd-  Br/ Gen/ Rectal- Not done, not indicated Extrem- cyanosis- none, clubbing, none, atrophy- none, strength- nl, + surgical scar left knee Neuro- grossly intact to observation

## 2022-06-22 ENCOUNTER — Ambulatory Visit: Payer: Medicare HMO | Admitting: Internal Medicine

## 2022-06-22 ENCOUNTER — Encounter: Payer: Self-pay | Admitting: Internal Medicine

## 2022-06-22 DIAGNOSIS — Z6841 Body Mass Index (BMI) 40.0 and over, adult: Secondary | ICD-10-CM | POA: Diagnosis not present

## 2022-06-22 DIAGNOSIS — D693 Immune thrombocytopenic purpura: Secondary | ICD-10-CM | POA: Diagnosis not present

## 2022-06-22 DIAGNOSIS — G4733 Obstructive sleep apnea (adult) (pediatric): Secondary | ICD-10-CM | POA: Diagnosis not present

## 2022-06-22 NOTE — Patient Instructions (Signed)
We can continue CPAP auto 8-12  Please call if we can help

## 2022-07-28 NOTE — Assessment & Plan Note (Signed)
Remains a problem. He would need external support program probably.

## 2022-07-28 NOTE — Assessment & Plan Note (Signed)
CPAP resumed- does benefit Plan- continue auto 8-12

## 2022-07-28 NOTE — Assessment & Plan Note (Signed)
Recent epistaxis controlled with cautery.

## 2022-08-14 DIAGNOSIS — H34231 Retinal artery branch occlusion, right eye: Secondary | ICD-10-CM | POA: Diagnosis not present

## 2022-08-14 DIAGNOSIS — H02834 Dermatochalasis of left upper eyelid: Secondary | ICD-10-CM | POA: Diagnosis not present

## 2022-08-14 DIAGNOSIS — H5703 Miosis: Secondary | ICD-10-CM | POA: Diagnosis not present

## 2022-08-14 DIAGNOSIS — H02831 Dermatochalasis of right upper eyelid: Secondary | ICD-10-CM | POA: Diagnosis not present

## 2022-08-14 DIAGNOSIS — H2181 Floppy iris syndrome: Secondary | ICD-10-CM | POA: Diagnosis not present

## 2022-08-14 DIAGNOSIS — H04123 Dry eye syndrome of bilateral lacrimal glands: Secondary | ICD-10-CM | POA: Diagnosis not present

## 2022-08-14 DIAGNOSIS — Z961 Presence of intraocular lens: Secondary | ICD-10-CM | POA: Diagnosis not present

## 2022-08-14 DIAGNOSIS — E119 Type 2 diabetes mellitus without complications: Secondary | ICD-10-CM | POA: Diagnosis not present

## 2022-08-14 DIAGNOSIS — H43812 Vitreous degeneration, left eye: Secondary | ICD-10-CM | POA: Diagnosis not present

## 2022-08-27 DIAGNOSIS — M19011 Primary osteoarthritis, right shoulder: Secondary | ICD-10-CM | POA: Diagnosis not present

## 2022-08-30 ENCOUNTER — Telehealth: Payer: Self-pay | Admitting: *Deleted

## 2022-08-30 NOTE — Telephone Encounter (Signed)
   Pre-operative Risk Assessment    Patient Name: Gordon Madden  DOB: 1950-12-26 MRN: [id number]      Request for Surgical Clearance    Procedure:   RIGHT REVISION TOTAL KNEE REPLACEMENT  Date of Surgery:  Clearance TBD                                 Surgeon:  DR. [name] Surgeon's Group or Practice Name:  [facility] Phone number:  [phone] EXT [phone] ATTN: Derek Jack Fax number:  [phone]   Type of Clearance Requested:   - Medical  - Pharmacy:  Hold Clopidogrel (Plavix)     Type of Anesthesia:   CHOICE   Additional requests/questions:    Jiles Prows   08/30/2022, 4:30 PM

## 2022-08-30 NOTE — Telephone Encounter (Signed)
Pt agreeable to plan of care for tele pre op appt 09/06/22 @ 2:40. Med rec and consent are done.

## 2022-08-30 NOTE — Telephone Encounter (Signed)
Pt agreeable to plan of care for tele pre op appt 09/06/22 @ 2:40. Med rec and consent are done.     Patient Consent for Virtual Visit        Gordon Madden has provided verbal consent on 08/30/2022 for a virtual visit (video or telephone).   CONSENT FOR VIRTUAL VISIT FOR:  Gordon Madden  By participating in this virtual visit I agree to the following:  I hereby voluntarily request, consent and authorize [facility] and its employed or contracted physicians, physician assistants, nurse practitioners or other licensed health care professionals (the Practitioner), to provide me with telemedicine health care services (the "Services") as deemed necessary by the treating Practitioner. I acknowledge and consent to receive the Services by the Practitioner via telemedicine. I understand that the telemedicine visit will involve communicating with the Practitioner through live audiovisual communication technology and the disclosure of certain medical information by electronic transmission. I acknowledge that I have been given the opportunity to request an in-person assessment or other available alternative prior to the telemedicine visit and am voluntarily participating in the telemedicine visit.  I understand that I have the right to withhold or withdraw my consent to the use of telemedicine in the course of my care at any time, without affecting my right to future care or treatment, and that the Practitioner or I may terminate the telemedicine visit at any time. I understand that I have the right to inspect all information obtained and/or recorded in the course of the telemedicine visit and may receive copies of available information for a reasonable fee.  I understand that some of the potential risks of receiving the Services via telemedicine include:  Delay or interruption in medical evaluation due to technological equipment failure or disruption; Information transmitted may not be sufficient  (e.g. poor resolution of images) to allow for appropriate medical decision making by the Practitioner; and/or  In rare instances, security protocols could fail, causing a breach of personal health information.  Furthermore, I acknowledge that it is my responsibility to provide information about my medical history, conditions and care that is complete and accurate to the best of my ability. I acknowledge that Practitioner's advice, recommendations, and/or decision may be based on factors not within their control, such as incomplete or inaccurate data provided by me or distortions of diagnostic images or specimens that may result from electronic transmissions. I understand that the practice of medicine is not an exact science and that Practitioner makes no warranties or guarantees regarding treatment outcomes. I acknowledge that a copy of this consent can be made available to me via my patient portal ([facility]), or I can request a printed copy by calling the office of [facility].    I understand that my insurance will be billed for this visit.   I have read or had this consent read to me. I understand the contents of this consent, which adequately explains the benefits and risks of the Services being provided via telemedicine.  I have been provided ample opportunity to ask questions regarding this consent and the Services and have had my questions answered to my satisfaction. I give my informed consent for the services to be provided through the use of telemedicine in my medical care

## 2022-08-30 NOTE — Telephone Encounter (Signed)
   Name: Gordon Madden  DOB: Jan 31, 1951  MRN: [id number]  Primary Cardiologist: Larae Grooms, MD   Preoperative team, please contact this patient and set up a phone call appointment for further preoperative risk assessment. Please obtain consent and complete medication review. Thank you for your help.  I confirm that guidance regarding antiplatelet and oral anticoagulation therapy has been completed and, if necessary, noted below.  His Plavix may be held for 5 days prior to his procedure.   Deberah Pelton, NP 08/30/2022, 4:50 PM [place]

## 2022-09-06 ENCOUNTER — Ambulatory Visit: Payer: Medicare HMO | Attending: Cardiology | Admitting: Physician Assistant

## 2022-09-06 DIAGNOSIS — Z01810 Encounter for preprocedural cardiovascular examination: Secondary | ICD-10-CM

## 2022-09-06 NOTE — Progress Notes (Signed)
Virtual Visit via Telephone Note   Because of Gordon Madden's co-morbid illnesses, he is at least at moderate risk for complications without adequate follow up.  This format is felt to be most appropriate for this patient at this time.  The patient did not have access to video technology/had technical difficulties with video requiring transitioning to audio format only (telephone).  All issues noted in this document were discussed and addressed.  No physical exam could be performed with this format.  Please refer to the patient's chart for his consent to telehealth for Gordon Madden.  Evaluation Performed:  Preoperative cardiovascular risk assessment _____________   Date:  09/06/2022   Patient ID:  Gordon Madden, DOB 09-Mar-1951, MRN [id number] Patient Location:  Home Provider location:   Office  Primary Care Provider:  Mckinley Jewel, MD Primary Cardiologist:  Larae Grooms, MD  Chief Complaint / Patient Profile   71 y.o. y/o male with a h/o CAD, s/p MI in 2004 and found to have a totally occluded RCA at [facility] in [place] who is pending right revision total knee replacement and presents today for telephonic preoperative cardiovascular risk assessment.  Past Medical History    Past Medical History:  Diagnosis Date   Arthritis    Ascending aorta dilatation (HCC)    BPH (benign prostatic hyperplasia)    CAD (coronary artery disease)    a. MI 2004 with occ RCA.   Cardiomyopathy    EF 01%   Chronic systolic CHF (congestive heart failure) (HCC)    Colon polyps    CPAP (continuous positive airway pressure) dependence    Diabetes mellitus, type 2 (HCC)    History of gout    Hyperlipidemia    Hypertension    Idiopathic thrombocytopenia (HCC)    Morbid obesity (HCC)    Myocardial infarction (HCC)    OSA (obstructive sleep apnea)    07/25/10 AHI 14.9/hr   Pneumonia    PONV (postoperative nausea and vomiting)    Primary localized  osteoarthritis of left knee 06/07/2015   Primary localized osteoarthritis of right knee 08/06/2017   Rhinosinusitis    Shortness of breath dyspnea    Stroke [facility])    right amaurosis fugax, concern for retinal artery occlusion 12/2009; cerebral arteriography 01/30/10: no occlusion, stenoses, dissections, aneurysms, AVM noted, patent opthalmic arteries   Vision decreased    Past Surgical History:  Procedure Laterality Date   CARDIAC CATHETERIZATION  2005   no stents   CHOLECYSTECTOMY N/A 02/02/2021   Procedure: LAPAROSCOPIC CHOLECYSTECTOMY;  Surgeon: [name], MD;  Location: [place];  Service: General;  Laterality: N/A;   CYST EXCISION     chest and back   HERNIA REPAIR     umbilical   JOINT REPLACEMENT     Left 06/07/15   KNEE CARTILAGE SURGERY     left   KNEE SURGERY Right 08/06/2017   MENISCUS REPAIR Left    NASAL SINUS SURGERY     PARTIAL KNEE ARTHROPLASTY Right 08/06/2017   Procedure: UNICOMPARTMENTAL KNEE;  Surgeon: [name], MD;  Location: [place];  Service: Orthopedics;  Laterality: Right;   SINUS IRRIGATION     TOTAL KNEE ARTHROPLASTY Left 06/07/2015   Procedure: TOTAL KNEE ARTHROPLASTY;  Surgeon: [name], MD;  Location: [place];  Service: Orthopedics;  Laterality: Left;   UMBILICAL HERNIA REPAIR      Allergies  Allergies  Allergen Reactions   Plavix [Clopidogrel] Other (See Comments)  Thrombocytopenia-plavix causes low platelets but pt's MD monitors pt closely, and pt does take plavix.     History of Present Illness    Gordon Madden is a 71 y.o. male who presents via audio/video conferencing for a telehealth visit today.  Pt was last seen in cardiology clinic on 12/22 by Dr. Irish Lack.  At that time ZALYN AMEND was doing well.  The patient is now pending procedure as outlined above. Since his last visit, he has been feeling pretty good.  No chest pains or shortness of breath.  He states that he walks 3 laps [place] which is a 30-minute walk.  He is  lost over 25 pounds since July for the surgery.  He is pleased with his progress.   He knows to hold Plavix 5 days prior to his procedure.  Please restart medically safe to do so.     Home Medications    Prior to Admission medications   Medication Sig Start Date End Date Taking? Authorizing Provider  carvedilol (COREG) 6.25 MG tablet TAKE 1 TABLET TWICE DAILY 10/25/21   Jettie Booze, MD  clopidogrel (PLAVIX) 75 MG tablet Take 75 mg by mouth daily. 02/04/18   [provider]  Coenzyme Q10 (CO Q 10) 100 MG CAPS Take 300 mg by mouth daily. Patient not taking: Reported on 08/30/2022    [provider]  Coenzyme Q10 (COQ-10 PO) Take 300 mg by mouth daily.    [provider]  ENTRESTO 97-103 MG TAKE 1 TABLET TWICE DAILY. KEEP UPCOMING APPT IN DECEMBER 2022 WITH DR. VARANASI BEFORE ANYMORE REFILLS. 01/23/22   Jettie Booze, MD  fexofenadine (ALLEGRA) 180 MG tablet Take 180 mg by mouth daily.    [provider]  finasteride (PROSCAR) 5 MG tablet Take 5 mg by mouth daily. 06/16/22   [provider]  folic acid (FOLVITE) 732 MCG tablet Take 400 mcg by mouth daily.    [provider]  ibuprofen (ADVIL,MOTRIN) 200 MG tablet Take 600 mg by mouth daily.    [provider]  metFORMIN (GLUCOPHAGE) 500 MG tablet Take 500 mg by mouth daily with breakfast.    [provider]  PFIZER-BIONT COVID-19 VAC-TRIS SUSP injection  03/02/21   [provider]  rosuvastatin (CRESTOR) 20 MG tablet Take 1 tablet (20 mg total) by mouth daily. 11/09/21   Jettie Booze, MD  silodosin (RAPAFLO) 8 MG CAPS capsule Take 8 mg by mouth daily. 04/25/22   [provider]  vitamin B-12 (CYANOCOBALAMIN) 1000 MCG tablet Take 1,000 mcg by mouth daily.    [provider]    Physical Exam    Vital Signs:  TORIANO AIKEY does not have vital signs available for review today.  Given telephonic nature of communication, physical  exam is limited. AAOx3. NAD. Normal affect.  Speech and respirations are unlabored.  Accessory Clinical Findings    None  Assessment & Plan    1.  Preoperative Cardiovascular Risk Assessment:   Mr. Hicks perioperative risk of a major cardiac event is 11% according to the Revised Cardiac Risk Index (RCRI).  Therefore, he is at high risk for perioperative complications.   His functional capacity is good at 5.62 METs according to the Duke Activity Status Index (DASI). Recommendations: According to ACC/AHA guidelines, no further cardiovascular testing needed.  The patient may proceed to surgery at acceptable risk.   Antiplatelet and/or Anticoagulation Recommendations: Clopidogrel (Plavix) can be held for 5 days prior  to his surgery and resumed as soon as possible post op.   A copy of this note will be routed to requesting surgeon.  Time:   Today, I have spent 10 minutes with the patient with telehealth technology discussing medical history, symptoms, and management plan.     Elgie Collard, PA-C  09/06/2022, 2:50 PM

## 2022-10-03 DIAGNOSIS — M25561 Pain in right knee: Secondary | ICD-10-CM | POA: Diagnosis not present

## 2022-10-03 NOTE — Progress Notes (Signed)
Sent message, via epic in basket, requesting orders in epic from surgeon.  

## 2022-10-04 DIAGNOSIS — E782 Mixed hyperlipidemia: Secondary | ICD-10-CM | POA: Diagnosis not present

## 2022-10-04 DIAGNOSIS — Z125 Encounter for screening for malignant neoplasm of prostate: Secondary | ICD-10-CM | POA: Diagnosis not present

## 2022-10-04 DIAGNOSIS — N181 Chronic kidney disease, stage 1: Secondary | ICD-10-CM | POA: Diagnosis not present

## 2022-10-04 DIAGNOSIS — I119 Hypertensive heart disease without heart failure: Secondary | ICD-10-CM | POA: Diagnosis not present

## 2022-10-04 DIAGNOSIS — D696 Thrombocytopenia, unspecified: Secondary | ICD-10-CM | POA: Diagnosis not present

## 2022-10-04 DIAGNOSIS — Z0000 Encounter for general adult medical examination without abnormal findings: Secondary | ICD-10-CM | POA: Diagnosis not present

## 2022-10-04 DIAGNOSIS — E1121 Type 2 diabetes mellitus with diabetic nephropathy: Secondary | ICD-10-CM | POA: Diagnosis not present

## 2022-10-04 DIAGNOSIS — I5022 Chronic systolic (congestive) heart failure: Secondary | ICD-10-CM | POA: Diagnosis not present

## 2022-10-04 DIAGNOSIS — I2510 Atherosclerotic heart disease of native coronary artery without angina pectoris: Secondary | ICD-10-CM | POA: Diagnosis not present

## 2022-10-09 NOTE — Patient Instructions (Signed)
SURGICAL WAITING ROOM VISITATION Patients having surgery or a procedure may have no more than 2 support people in the waiting area - these visitors may rotate.   Children under the age of 39 must have an adult with them who is not the patient. If the patient needs to stay at the hospital during part of their recovery, the visitor guidelines for inpatient rooms apply. Pre-op nurse will coordinate an appropriate time for 1 support person to accompany patient in pre-op.  This support person may not rotate.    Please refer to the [facility] website for the visitor guidelines for Inpatients (after your surgery is over and you are in a regular room).    Your procedure is scheduled on: 10/23/22   Report to [facility] Main Entrance    Report to admitting at 8:00 AM   Call this number if you have problems the morning of surgery [phone]   Do not eat food :After Midnight.   After Midnight you may have the following liquids until 7:30 AM DAY OF SURGERY  Water Non-Citrus Juices (without pulp, NO RED) Carbonated Beverages Black Coffee (NO MILK/CREAM OR CREAMERS, sugar ok)  Clear Tea (NO MILK/CREAM OR CREAMERS, sugar ok) regular and decaf                             Plain Jell-O (NO RED)                                           Fruit ices (not with fruit pulp, NO RED)                                     Popsicles (NO RED)                                                               Sports drinks like Gatorade (NO RED)               The day of surgery:  Drink ONE (1) Pre-Surgery G2 at 7:30 AM the morning of surgery. Drink in one sitting. Do not sip.  This drink was given to you during your hospital  pre-op appointment visit. Nothing else to drink after completing the  Pre-Surgery G2.          If you have questions, please contact your surgeon's office.   FOLLOW BOWEL PREP AND ANY ADDITIONAL PRE OP INSTRUCTIONS YOU RECEIVED FROM YOUR SURGEON'S OFFICE!!!     Oral Hygiene is  also important to reduce your risk of infection.                                    Remember - BRUSH YOUR TEETH THE MORNING OF SURGERY WITH YOUR REGULAR TOOTHPASTE   Do NOT smoke after Midnight   Take these medicines the morning of surgery with A SIP OF WATER: Carvedilol, Allegra, Finasteride, Rosuvastatin   DO NOT TAKE ANY ORAL DIABETIC MEDICATIONS DAY OF YOUR SURGERY  How to Manage Your Diabetes Before and After Surgery  Why is it important to control my blood sugar before and after surgery? Improving blood sugar levels before and after surgery helps healing and can limit problems. A way of improving blood sugar control is eating a healthy diet by:  Eating less sugar and carbohydrates  Increasing activity/exercise  Talking with your doctor about reaching your blood sugar goals High blood sugars (greater than 180 mg/dL) can raise your risk of infections and slow your recovery, so you will need to focus on controlling your diabetes during the weeks before surgery. Make sure that the doctor who takes care of your diabetes knows about your planned surgery including the date and location.  How do I manage my blood sugar before surgery? Check your blood sugar at least 4 times a day, starting 2 days before surgery, to make sure that the level is not too high or low. Check your blood sugar the morning of your surgery when you wake up and every 2 hours until you get to the Short Stay unit. If your blood sugar is less than 70 mg/dL, you will need to treat for low blood sugar: Do not take insulin. Treat a low blood sugar (less than 70 mg/dL) with  cup of clear juice (cranberry or apple), 4 glucose tablets, OR glucose gel. Recheck blood sugar in 15 minutes after treatment (to make sure it is greater than 70 mg/dL). If your blood sugar is not greater than 70 mg/dL on recheck, call [phone] for further instructions. Report your blood sugar to the short stay nurse when you get to Short  Stay.  If you are admitted to the hospital after surgery: Your blood sugar will be checked by the staff and you will probably be given insulin after surgery (instead of oral diabetes medicines) to make sure you have good blood sugar levels. The goal for blood sugar control after surgery is 80-180 mg/dL.   WHAT DO I DO ABOUT MY DIABETES MEDICATION?  Do not take oral diabetes medicines (pills) the morning of surgery.  THE DAY BEFORE SURGERY, take Metformin as prescribed.     THE MORNING OF SURGERY, do not take Metformin    Reviewed and Endorsed by [facility] Patient Education Committee, August 2015  Bring CPAP mask and tubing day of surgery.                              You may not have any metal on your body including jewelry, and body piercing             Do not wear lotions, powders, cologne, or deodorant              Men may shave face and neck.   Do not bring valuables to the hospital. [place].   Contacts, dentures or bridgework may not be worn into surgery.   Bring small overnight bag day of surgery.   DO NOT [place]. PHARMACY WILL DISPENSE MEDICATIONS LISTED ON YOUR MEDICATION LIST TO YOU DURING YOUR ADMISSION [place]!   Special Instructions: Bring a copy of your healthcare power of attorney and living will documents the day of surgery if you haven't scanned them before.  Please read over the following fact sheets you were given: IF [place] [phone] Schneiders    If you received a COVID test during your pre-op visit  it is requested that you wear a mask when out in public, stay away from anyone that may not be feeling well and notify your surgeon if you develop symptoms. If you test positive for Covid or have been in contact with anyone that has tested positive in the last 10 days please notify you surgeon.     [place] - Preparing for Surgery Before surgery, you can play an important role.  Because skin is not sterile, your skin needs to be as free of germs as possible.  You can reduce the number of germs on your skin by washing with CHG (chlorahexidine gluconate) soap before surgery.  CHG is an antiseptic cleaner which kills germs and bonds with the skin to continue killing germs even after washing. Please DO NOT use if you have an allergy to CHG or antibacterial soaps.  If your skin becomes reddened/irritated stop using the CHG and inform your nurse when you arrive at Short Stay. Do not shave (including legs and underarms) for at least 48 hours prior to the first CHG shower.  You may shave your face/neck.  Please follow these instructions carefully:  1.  Shower with CHG Soap the night before surgery and the  morning of surgery.  2.  If you choose to wash your hair, wash your hair first as usual with your normal  shampoo.  3.  After you shampoo, rinse your hair and body thoroughly to remove the shampoo.                             4.  Use CHG as you would any other liquid soap.  You can apply chg directly to the skin and wash.  Gently with a scrungie or clean washcloth.  5.  Apply the CHG Soap to your body ONLY FROM THE NECK DOWN.   Do   not use on face/ open                           Wound or open sores. Avoid contact with eyes, ears mouth and   genitals (private parts).                       Wash face,  Genitals (private parts) with your normal soap.             6.  Wash thoroughly, paying special attention to the area where your    surgery  will be performed.  7.  Thoroughly rinse your body with warm water from the neck down.  8.  DO NOT shower/wash with your normal soap after using and rinsing off the CHG Soap.                9.  Pat yourself dry with a clean towel.            10.  Wear clean pajamas.            11.  Place clean sheets on your bed the night of your first shower and do not  sleep  with pets. Day of Surgery : Do not apply any lotions/deodorants the morning of surgery.  Please wear clean clothes to the  hospital/surgery center.  FAILURE TO FOLLOW THESE INSTRUCTIONS MAY RESULT IN THE CANCELLATION OF YOUR SURGERY  PATIENT SIGNATURE_________________________________  NURSE SIGNATURE__________________________________  ________________________________________________________________________  Adam Phenix  An incentive spirometer is a tool that can help keep your lungs clear and active. This tool measures how well you are filling your lungs with each breath. Taking long deep breaths may help reverse or decrease the chance of developing breathing (pulmonary) problems (especially infection) following: A long period of time when you are unable to move or be active. BEFORE THE PROCEDURE  If the spirometer includes an indicator to show your best effort, your nurse or respiratory therapist will set it to a desired goal. If possible, sit up straight or lean slightly forward. Try not to slouch. Hold the incentive spirometer in an upright position. INSTRUCTIONS FOR USE  Sit on the edge of your bed if possible, or sit up as far as you can in bed or on a chair. Hold the incentive spirometer in an upright position. Breathe out normally. Place the mouthpiece in your mouth and seal your lips tightly around it. Breathe in slowly and as deeply as possible, raising the piston or the ball toward the top of the column. Hold your breath for 3-5 seconds or for as long as possible. Allow the piston or ball to fall to the bottom of the column. Remove the mouthpiece from your mouth and breathe out normally. Rest for a few seconds and repeat Steps 1 through 7 at least 10 times every 1-2 hours when you are awake. Take your time and take a few normal breaths between deep breaths. The spirometer may include an indicator to show your best effort. Use the indicator as a goal to work toward  during each repetition. After each set of 10 deep breaths, practice coughing to be sure your lungs are clear. If you have an incision (the cut made at the time of surgery), support your incision when coughing by placing a pillow or rolled up towels firmly against it. Once you are able to get out of bed, walk around indoors and cough well. You may stop using the incentive spirometer when instructed by your caregiver.  RISKS AND COMPLICATIONS Take your time so you do not get dizzy or light-headed. If you are in pain, you may need to take or ask for pain medication before doing incentive spirometry. It is harder to take a deep breath if you are having pain. AFTER USE Rest and breathe slowly and easily. It can be helpful to keep track of a log of your progress. Your caregiver can provide you with a simple table to help with this. If you are using the spirometer at home, follow these instructions: [place] IF:  You are having difficultly using the spirometer. You have trouble using the spirometer as often as instructed. Your pain medication is not giving enough relief while using the spirometer. You develop fever of 100.5 F (38.1 C) or higher. SEEK IMMEDIATE MEDICAL CARE IF:  You cough up bloody sputum that had not been present before. You develop fever of 102 F (38.9 C) or greater. You develop worsening pain at or near the incision site. MAKE SURE YOU:  Understand these instructions. Will watch your condition. Will get help right away if you are not doing well or get worse. Document Released: 03/25/2007 Document Revised: 02/04/2012 Document Reviewed: 05/26/2007 [facility] Patient Information 2014 [place], [place].   ________________________________________________________________________

## 2022-10-09 NOTE — Progress Notes (Addendum)
Pt had labs done last week with PCP. Requested via fax  COVID Vaccine Completed: yes  Date of COVID positive in last 90 days: no  PCP - Early Osmond, MD Cardiologist - Larae Grooms, MD  Cardiac clearance by Nicholes Rough 09/06/22 in Epic   Chest x-ray - n/a EKG - 11/09/21 Epic Stress Test - 07/18/17 Epic ECHO - 2018 Cardiac Cath - 2005- no stents Pacemaker/ICD device last checked: n/a Spinal Cord Stimulator: n/a  Bowel Prep - no  Sleep Study - yes CPAP - yes every night  Fasting Blood Sugar -  Checks Blood Sugar _____ times a day  Last dose of GLP1 agonist-  N/A GLP1 instructions:  N/A   Last dose of SGLT-2 inhibitors-  N/A SGLT-2 instructions: N/A   Blood Thinner Instructions: Plavix, hold 5 days Aspirin Instructions: Last Dose: 10/17/22 0800  Activity level: Can go up a flight of stairs and perform activities of daily living without stopping and without symptoms of chest pain or shortness of breath.    Anesthesia review: HTN, MI, CVA,CAD, cardiomyopathy,  CHF, fatty liver, CKD, OSA, DM2  Patient denies shortness of breath, fever, cough and chest pain at PAT appointment  Patient verbalized understanding of instructions that were given to them at the PAT appointment. Patient was also instructed that they will need to review over the PAT instructions again at home before surgery.

## 2022-10-10 ENCOUNTER — Encounter (HOSPITAL_COMMUNITY): Payer: Self-pay

## 2022-10-10 ENCOUNTER — Encounter (HOSPITAL_COMMUNITY)
Admission: RE | Admit: 2022-10-10 | Discharge: 2022-10-10 | Disposition: A | Discharge status: Home / Self Care | Payer: Medicare HMO | Source: Ambulatory Visit | Attending: Orthopedic Surgery | Admitting: Orthopedic Surgery

## 2022-10-10 VITALS — BP 119/72 | HR 54 | Temp 98.7°F | Resp 16 | Ht 71.0 in | Wt 282.0 lb

## 2022-10-10 DIAGNOSIS — K760 Fatty (change of) liver, not elsewhere classified: Secondary | ICD-10-CM | POA: Diagnosis not present

## 2022-10-10 DIAGNOSIS — Z01818 Encounter for other preprocedural examination: Secondary | ICD-10-CM

## 2022-10-10 DIAGNOSIS — I5022 Chronic systolic (congestive) heart failure: Secondary | ICD-10-CM | POA: Insufficient documentation

## 2022-10-10 DIAGNOSIS — Z01812 Encounter for preprocedural laboratory examination: Secondary | ICD-10-CM | POA: Insufficient documentation

## 2022-10-10 DIAGNOSIS — E119 Type 2 diabetes mellitus without complications: Secondary | ICD-10-CM | POA: Diagnosis not present

## 2022-10-10 HISTORY — DX: Cardiac murmur, unspecified: R01.1

## 2022-10-10 HISTORY — DX: Personal history of urinary calculi: Z87.442

## 2022-10-10 LAB — TYPE AND SCREEN
ABO/RH(D): A POS
Antibody Screen: NEGATIVE

## 2022-10-10 LAB — SURGICAL PCR SCREEN
MRSA, PCR: NEGATIVE
Staphylococcus aureus: NEGATIVE

## 2022-10-10 LAB — GLUCOSE, CAPILLARY: Glucose-Capillary: 115 mg/dL — ABNORMAL HIGH (ref 70–99)

## 2022-10-13 ENCOUNTER — Other Ambulatory Visit: Payer: Self-pay | Admitting: Interventional Cardiology

## 2022-10-15 ENCOUNTER — Telehealth: Payer: Self-pay | Admitting: Interventional Cardiology

## 2022-10-15 NOTE — Telephone Encounter (Signed)
*  STAT* If patient is at the pharmacy, call can be transferred to refill team.   1. Which medications need to be refilled? (please list name of each medication and dose if known)   rosuvastatin (CRESTOR) 20 MG tablet   2. Which pharmacy/location (including street and city if local pharmacy) is medication to be sent to?  [facility] [phone], [address]   3. Do they need a 30 day or 90 day supply? 90 day  Patient stated he has about a week's worth of medication left.

## 2022-10-15 NOTE — Anesthesia Preprocedure Evaluation (Addendum)
Anesthesia Evaluation  Patient identified by MRN, date of birth, ID band Patient awake    Reviewed: Allergy & Precautions, NPO status , Patient's Chart, lab work & pertinent test results  Airway Mallampati: II  TM Distance: >3 FB Neck ROM: Full    Dental no notable dental hx.    Pulmonary sleep apnea and Continuous Positive Airway Pressure Ventilation , Current Smoker   Pulmonary exam normal        Cardiovascular hypertension, Pt. on home beta blockers + CAD, + Past MI and +CHF  Normal cardiovascular exam     Neuro/Psych CVA  negative psych ROS   GI/Hepatic negative GI ROS, Neg liver ROS,,,  Endo/Other  diabetes, Oral Hypoglycemic Agents    Renal/GU negative Renal ROS     Musculoskeletal  (+) Arthritis ,    Abdominal   Peds  Hematology  (+) Blood dyscrasia (Plavix) Idiopathic thrombocytopenia    Anesthesia Other Findings RIGHT KNEE HARDWARE COMPLICATIONS  Reproductive/Obstetrics                             Anesthesia Physical Anesthesia Plan  ASA: 3  Anesthesia Plan: General and Regional   Post-op Pain Management: Regional block*   Induction: Intravenous  PONV Risk Score and Plan: 1 and Ondansetron, Dexamethasone and Treatment may vary due to age or medical condition  Airway Management Planned: Oral ETT  Additional Equipment:   Intra-op Plan:   Post-operative Plan: Extubation in OR  Informed Consent: I have reviewed the patients History and Physical, chart, labs and discussed the procedure including the risks, benefits and alternatives for the proposed anesthesia with the patient or authorized representative who has indicated his/her understanding and acceptance.     Dental advisory given  Plan Discussed with: CRNA  Anesthesia Plan Comments: (PAT note 10/10/2022)       Anesthesia Quick Evaluation

## 2022-10-15 NOTE — Telephone Encounter (Signed)
Pt's medication was sent to pt's pharmacy as requested. Confirmation received.  °

## 2022-10-15 NOTE — Progress Notes (Addendum)
Anesthesia Chart Review   Case: [id number] Date/Time: 10/23/22 1013   Procedure: TOTAL KNEE ARTHROPLASTY WITH REVISION COMPONENTS (Right: Knee)   Anesthesia type: Choice   Pre-op diagnosis: RIGHT KNEE HARDWARE COMPLICATIONS   Location: [place] [facility]   Surgeons: [name], MD       DISCUSSION:71 y.o. every day smoker with h/o PONV, DM II, CAD s/p MI in 2004 and found to have a totally occluded RCA, CHF, Stroke, HTN, OSA w/CPAP, right knee hardware complications scheduled for above procedure 10/23/2022 with Dr. [name].   Pt seen by cardiology 09/06/2022 for preoperative evaluation. Per OV note, "Gordon Madden's perioperative risk of a major cardiac event is 11% according to the Revised Cardiac Risk Index (RCRI).  Therefore, he is at high risk for perioperative complications.   His functional capacity is good at 5.62 METs according to the Duke Activity Status Index (DASI). Recommendations: According to ACC/AHA guidelines, no further cardiovascular testing needed.  The patient may proceed to surgery at acceptable risk.   Antiplatelet and/or Anticoagulation Recommendations: Clopidogrel (Plavix) can be held for 5 days prior to his surgery and resumed as soon as possible post op."  Pt reports labs drawn at PCP office last week, nurse requested labs.  They have not been received.  Labs DOS.   Anticipate pt can proceed with planned procedure barring acute status change.   VS: BP 119/72   Pulse (!) 54   Temp 37.1 C (Oral)   Resp 16   Ht '5\' 11"'$  (1.803 m)   Wt 127.9 kg   BMI 39.33 kg/m   PROVIDERS: Mckinley Jewel, MD is PCP   Cardiologist - Larae Grooms, MD  LABS: Labs reviewed: Acceptable for surgery. (all labs ordered are listed, but only abnormal results are displayed)  Labs Reviewed  GLUCOSE, CAPILLARY - Abnormal; Notable for the following components:      Result Value   Glucose-Capillary 115 (*)    All other components within normal limits  SURGICAL PCR SCREEN   TYPE AND SCREEN     IMAGES:   EKG:   CV: Myocardial Perfusion8/23/2018 Nuclear stress EF: 36%. There was no ST segment deviation noted during stress.   1. EF 36% with basal/mid inferolateral and basal inferior hypokinesis.  2. Fixed large, severe basal inferior and basal to mid inferolateral perfusion defect.  This suggests infarction without significant ischemia.    Intermediate risk study due to low EF.   Echo 07/18/2017 - Left ventricle: The cavity size was moderately dilated. Wall    thickness was normal. Systolic function was moderately reduced.    The estimated ejection fraction was in the range of 35% to 40%.    Global hypokinesis with regional variation inferiorly. Doppler    parameters are consistent with abnormal left ventricular    relaxation (grade 1 diastolic dysfunction). The E/e&' ratio is    between 8-15, suggesting indeterminate LV filling pressure.    Ejection fraction (MOD, 2-plane): 37%.  - Aortic valve: Trileaflet; mildly calcified leaflets. There was no    stenosis. There was no regurgitation.  - Mitral valve: Calcified annulus. Mildly thickened leaflets .    There was trivial regurgitation.  - Left atrium: Moderately dilated. The atrium was mildly dilated.  - Right atrium: Moderately dilated.  - Inferior vena cava: The vessel was normal in size. The    respirophasic diameter changes were in the normal range (>= 50%),    consistent with normal central venous pressure.  Past Medical  History:  Diagnosis Date   Arthritis    Ascending aorta dilatation (HCC)    BPH (benign prostatic hyperplasia)    CAD (coronary artery disease)    a. MI 2004 with occ RCA.   Cardiomyopathy    EF 45%   Chronic systolic CHF (congestive heart failure) (HCC)    Colon polyps    CPAP (continuous positive airway pressure) dependence    Diabetes mellitus, type 2 (HCC)    Heart murmur    History of gout    History of kidney stones    Hyperlipidemia    Hypertension     Idiopathic thrombocytopenia (HCC)    Morbid obesity (HCC)    Myocardial infarction (HCC)    OSA (obstructive sleep apnea)    07/25/10 AHI 14.9/hr   Pneumonia    PONV (postoperative nausea and vomiting)    Primary localized osteoarthritis of left knee 06/07/2015   Primary localized osteoarthritis of right knee 08/06/2017   Rhinosinusitis    Shortness of breath dyspnea    Stroke [facility])    right amaurosis fugax, concern for retinal artery occlusion 12/2009; cerebral arteriography 01/30/10: no occlusion, stenoses, dissections, aneurysms, AVM noted, patent opthalmic arteries   Vision decreased     Past Surgical History:  Procedure Laterality Date   CARDIAC CATHETERIZATION  2005   no stents   CHOLECYSTECTOMY N/A 02/02/2021   Procedure: LAPAROSCOPIC CHOLECYSTECTOMY;  Surgeon: [name], MD;  Location: [place];  Service: General;  Laterality: N/A;   CYST EXCISION     chest and back   HERNIA REPAIR     umbilical   JOINT REPLACEMENT     Left 06/07/15   KNEE CARTILAGE SURGERY     left   KNEE SURGERY Right 08/06/2017   MENISCUS REPAIR Left    NASAL SINUS SURGERY     PARTIAL KNEE ARTHROPLASTY Right 08/06/2017   Procedure: UNICOMPARTMENTAL KNEE;  Surgeon: [name], MD;  Location: [place];  Service: Orthopedics;  Laterality: Right;   SINUS IRRIGATION     TOTAL KNEE ARTHROPLASTY Left 06/07/2015   Procedure: TOTAL KNEE ARTHROPLASTY;  Surgeon: [name], MD;  Location: [place];  Service: Orthopedics;  Laterality: Left;   UMBILICAL HERNIA REPAIR      MEDICATIONS:  carvedilol (COREG) 6.25 MG tablet   clopidogrel (PLAVIX) 75 MG tablet   Coenzyme Q10 (COQ-10 PO)   ENTRESTO 97-103 MG   fexofenadine (ALLEGRA) 180 MG tablet   finasteride (PROSCAR) 5 MG tablet   folic acid (FOLVITE) 809 MCG tablet   ibuprofen (ADVIL,MOTRIN) 200 MG tablet   metFORMIN (GLUCOPHAGE) 500 MG tablet   rosuvastatin (CRESTOR) 20 MG tablet   silodosin (RAPAFLO) 8 MG CAPS capsule   vitamin B-12 (CYANOCOBALAMIN) 1000  MCG tablet   No current facility-administered medications for this encounter.    Gordon Felix Ward, PA-C [facility] [phone]

## 2022-10-22 NOTE — H&P (Signed)
KNEE ARTHROPLASTY ADMISSION H&P  Patient ID: Gordon Madden MRN: [id number] DOB/AGE: Jun 19, 1951 71 y.o.  Chief Complaint: right knee pain.  Planned Procedure Date: 10/23/22 Medical Clearance by Dr. Doristine Bosworth   Cardiac Clearance by Nicholes Rough PA-C   HPI: Gordon Madden is a 72 y.o. male who presents for evaluation of painful right unicompartmental knee replacement. He has been doing well up until May of this year when his knee "seized up" while on a trip. Since then he has had pain and dysfunction of the right knee.  The patient  has failed non-surgical conservative treatments for greater than 12 weeks to include NSAID's and/or analgesics, corticosteriod injections, flexibility and strengthening excercises, and activity modification.  Onset of symptoms was abrupt, starting May 2023 with gradually worsening course since that time. The patient noted prior procedures on the knee to include  unicompartmental arthroplasty on the right knee.  Patient currently rates pain at 2 out of 10 with activity. Patient has worsening of pain with activity and weight bearing, pain that interferes with activities of daily living, and joint swelling.  Patient has evidence of joint space narrowing in the lateral compartment by imaging studies.  There is no active infection.  Past Medical History:  Diagnosis Date   Arthritis    Ascending aorta dilatation (HCC)    BPH (benign prostatic hyperplasia)    CAD (coronary artery disease)    a. MI 2004 with occ RCA.   Cardiomyopathy    EF 36%   Chronic systolic CHF (congestive heart failure) (HCC)    Colon polyps    CPAP (continuous positive airway pressure) dependence    Diabetes mellitus, type 2 (HCC)    Heart murmur    History of gout    History of kidney stones    Hyperlipidemia    Hypertension    Idiopathic thrombocytopenia (HCC)    Morbid obesity (HCC)    Myocardial infarction (HCC)    OSA (obstructive sleep apnea)    07/25/10 AHI 14.9/hr   Pneumonia    PONV  (postoperative nausea and vomiting)    Primary localized osteoarthritis of left knee 06/07/2015   Primary localized osteoarthritis of right knee 08/06/2017   Rhinosinusitis    Shortness of breath dyspnea    Stroke [facility])    right amaurosis fugax, concern for retinal artery occlusion 12/2009; cerebral arteriography 01/30/10: no occlusion, stenoses, dissections, aneurysms, AVM noted, patent opthalmic arteries   Vision decreased    Past Surgical History:  Procedure Laterality Date   CARDIAC CATHETERIZATION  2005   no stents   CHOLECYSTECTOMY N/A 02/02/2021   Procedure: LAPAROSCOPIC CHOLECYSTECTOMY;  Surgeon: [name], MD;  Location: [place];  Service: General;  Laterality: N/A;   CYST EXCISION     chest and back   HERNIA REPAIR     umbilical   JOINT REPLACEMENT     Left 06/07/15   KNEE CARTILAGE SURGERY     left   KNEE SURGERY Right 08/06/2017   MENISCUS REPAIR Left    NASAL SINUS SURGERY     PARTIAL KNEE ARTHROPLASTY Right 08/06/2017   Procedure: UNICOMPARTMENTAL KNEE;  Surgeon: [name], MD;  Location: [place];  Service: Orthopedics;  Laterality: Right;   SINUS IRRIGATION     TOTAL KNEE ARTHROPLASTY Left 06/07/2015   Procedure: TOTAL KNEE ARTHROPLASTY;  Surgeon: [name], MD;  Location: [place];  Service: Orthopedics;  Laterality: Left;   UMBILICAL HERNIA REPAIR     Allergies  Allergen Reactions  Plavix [Clopidogrel] Other (See Comments)    Thrombocytopenia-plavix causes low platelets but pt's MD monitors pt closely, and pt does take plavix.    Prior to Admission medications   Medication Sig Start Date End Date Taking? Authorizing Provider  carvedilol (COREG) 6.25 MG tablet TAKE 1 TABLET TWICE DAILY 10/25/21  Yes Jettie Booze, MD  clopidogrel (PLAVIX) 75 MG tablet Take 75 mg by mouth daily. 02/04/18  Yes [provider]  Coenzyme Q10 (COQ-10 PO) Take 300 mg by mouth daily.   Yes [provider]  ENTRESTO 97-103 MG TAKE 1 TABLET TWICE DAILY. KEEP  UPCOMING APPT IN DECEMBER 2022 WITH DR. VARANASI BEFORE ANYMORE REFILLS. 01/23/22  Yes Jettie Booze, MD  fexofenadine (ALLEGRA) 180 MG tablet Take 180 mg by mouth daily.   Yes [provider]  finasteride (PROSCAR) 5 MG tablet Take 5 mg by mouth daily. 06/16/22  Yes [provider]  folic acid (FOLVITE) 782 MCG tablet Take 400 mcg by mouth daily.   Yes [provider]  ibuprofen (ADVIL,MOTRIN) 200 MG tablet Take 600 mg by mouth daily.   Yes [provider]  metFORMIN (GLUCOPHAGE) 500 MG tablet Take 500 mg by mouth daily with breakfast.   Yes [provider]  silodosin (RAPAFLO) 8 MG CAPS capsule Take 8 mg by mouth daily. 04/25/22  Yes [provider]  vitamin B-12 (CYANOCOBALAMIN) 1000 MCG tablet Take 1,000 mcg by mouth daily.   Yes [provider]  rosuvastatin (CRESTOR) 20 MG tablet TAKE ONE TABLET BY MOUTH DAILY *STOP PRAVASTATIN* 10/15/22   Jettie Booze, MD   Social History   Socioeconomic History   Marital status: Married    Spouse name: Not on file   Number of children: Not on file   Years of education: Not on file   Highest education level: Not on file  Occupational History   Occupation: Human resources officer    Employer: [employer]  Tobacco Use   Smoking status: Every Day    Packs/day: 3.00    Years: 20.00    Total pack years: 60.00    Types: Cigars, Cigarettes    Last attempt to quit: 11/26/2016    Years since quitting: 5.9   Smokeless tobacco: Never   Tobacco comments:    stopped smoking cigarettes in 1991, smokes 1 cigar per day  Vaping Use   Vaping Use: Never used  Substance and Sexual Activity   Alcohol use: Yes    Alcohol/week: 1.0 standard drink of alcohol    Types: 1 Standard drinks or equivalent per week    Comment: occassionally   Drug use: No   Sexual activity: Not on file  Other Topics Concern   Not on file  Social History Narrative   ** Merged History Encounter **        Social Determinants of Health   Financial Resource Strain: Not on file  Food Insecurity: Not on file  Transportation Needs: Not on file  Physical Activity: Not on file  Stress: Not on file  Social Connections: Not on file   Family History  Problem Relation Age of Onset   Hypertension Father    Colon cancer Neg Hx    Heart attack Neg Hx     ROS: Currently denies lightheadedness, dizziness, Fever, chills, CP, SOB. No personal history of DVT, PE. Histroy of  MI and CVA. No loose teeth or dentures All other systems have been reviewed and were otherwise currently negative with the exception  of those mentioned in the HPI and as above.  Objective: Vitals: Ht: '5\' 11"'$  Wt: 279.6 lbs Temp: 98.3 BP: 114/77 Pulse: 62 O2 94% on room air.   Physical Exam: General: Alert, NAD.  Antalgic Gait  HEENT: EOMI, Good Neck Extension  Pulm: No increased work of breathing.  Clear B/L A/P w/o crackle or wheeze.  CV: RRR, No m/g/r appreciated  GI: soft, NT, ND Neuro: Neuro without gross focal deficit.  Sensation intact distally Skin: No lesions in the area of chief complaint MSK/Surgical Site: right knee w/o redness or effusion. Well healed surgical wounds. lateral JLT. ROM 0-120.  5/5 strength in extension and flexion.  +EHL/FHL.  NVI.  Stable varus and valgus stress.    Imaging Review Plain radiographs demonstrate diffuse degenerative joint disease of the lateral and patellofemoral compartments of the right knee.   Preoperative templating of the joint replacement has been completed, documented, and submitted to the Operating Room personnel in order to optimize intra-operative equipment management.  Assessment: Painful right unicompartmental knee replacement   Plan: Plan for Procedure(s): TOTAL KNEE ARTHROPLASTY WITH REVISION COMPONENTS  The patient history, physical exam, clinical judgement of the provider and imaging are consistent with end stage degenerative joint disease and total joint  arthroplasty is deemed medically necessary. The treatment options including medical management, injection therapy, and arthroplasty were discussed at length. The risks and benefits of Procedure(s): TOTAL KNEE ARTHROPLASTY WITH REVISION COMPONENTS were presented and reviewed.  The risks of nonoperative treatment, versus surgical intervention including but not limited to continued pain, aseptic loosening, stiffness, dislocation/subluxation, infection, bleeding, nerve injury, blood clots, cardiopulmonary complications, morbidity, mortality, among others were discussed. The patient verbalizes understanding and wishes to proceed with the plan.  Patient will be in observation for surgery, pain control, PT, prophylactic antibiotics, VTE prophylaxis, progressive ambulation, ADL's and discharge planning.   Dental prophylaxis discussed and recommended for 2 years postoperatively.  The patient does meet the criteria for TXA which will be used perioperatively.   Home aspirin and Plavix  will be used postoperatively for DVT prophylaxis in addition to SCDs, and early ambulation. The patient is planning to be discharged home with OPPT in care of family   Ventura Bruns, Hershal Coria 10/22/2022 1:27 PM

## 2022-10-23 ENCOUNTER — Inpatient Hospital Stay (HOSPITAL_COMMUNITY): Payer: Medicare HMO | Admitting: Physician Assistant

## 2022-10-23 ENCOUNTER — Inpatient Hospital Stay (HOSPITAL_COMMUNITY): Payer: Medicare HMO

## 2022-10-23 ENCOUNTER — Other Ambulatory Visit: Payer: Self-pay

## 2022-10-23 ENCOUNTER — Encounter (HOSPITAL_COMMUNITY): Payer: Self-pay | Admitting: Orthopedic Surgery

## 2022-10-23 ENCOUNTER — Inpatient Hospital Stay (HOSPITAL_COMMUNITY)
Admission: RE | Admit: 2022-10-23 | Discharge: 2022-10-24 | DRG: 467 | Disposition: A | Discharge status: Home / Self Care | Payer: Medicare HMO | Source: Ambulatory Visit | Attending: Orthopedic Surgery | Admitting: Orthopedic Surgery

## 2022-10-23 ENCOUNTER — Encounter (HOSPITAL_COMMUNITY)
Admission: RE | Disposition: A | Discharge status: Home / Self Care | Payer: Self-pay | Source: Ambulatory Visit | Attending: Orthopedic Surgery

## 2022-10-23 DIAGNOSIS — Z7984 Long term (current) use of oral hypoglycemic drugs: Secondary | ICD-10-CM

## 2022-10-23 DIAGNOSIS — G4733 Obstructive sleep apnea (adult) (pediatric): Secondary | ICD-10-CM | POA: Diagnosis not present

## 2022-10-23 DIAGNOSIS — I5022 Chronic systolic (congestive) heart failure: Secondary | ICD-10-CM | POA: Diagnosis present

## 2022-10-23 DIAGNOSIS — Y792 Prosthetic and other implants, materials and accessory orthopedic devices associated with adverse incidents: Secondary | ICD-10-CM | POA: Diagnosis present

## 2022-10-23 DIAGNOSIS — Z7902 Long term (current) use of antithrombotics/antiplatelets: Secondary | ICD-10-CM | POA: Diagnosis not present

## 2022-10-23 DIAGNOSIS — E785 Hyperlipidemia, unspecified: Secondary | ICD-10-CM | POA: Diagnosis present

## 2022-10-23 DIAGNOSIS — I110 Hypertensive heart disease with heart failure: Secondary | ICD-10-CM | POA: Diagnosis present

## 2022-10-23 DIAGNOSIS — F17290 Nicotine dependence, other tobacco product, uncomplicated: Secondary | ICD-10-CM | POA: Diagnosis present

## 2022-10-23 DIAGNOSIS — I509 Heart failure, unspecified: Secondary | ICD-10-CM | POA: Diagnosis not present

## 2022-10-23 DIAGNOSIS — I252 Old myocardial infarction: Secondary | ICD-10-CM | POA: Diagnosis not present

## 2022-10-23 DIAGNOSIS — Z9989 Dependence on other enabling machines and devices: Secondary | ICD-10-CM

## 2022-10-23 DIAGNOSIS — N400 Enlarged prostate without lower urinary tract symptoms: Secondary | ICD-10-CM | POA: Diagnosis present

## 2022-10-23 DIAGNOSIS — Z471 Aftercare following joint replacement surgery: Secondary | ICD-10-CM | POA: Diagnosis not present

## 2022-10-23 DIAGNOSIS — Z79899 Other long term (current) drug therapy: Secondary | ICD-10-CM | POA: Diagnosis not present

## 2022-10-23 DIAGNOSIS — I2510 Atherosclerotic heart disease of native coronary artery without angina pectoris: Secondary | ICD-10-CM | POA: Diagnosis present

## 2022-10-23 DIAGNOSIS — I429 Cardiomyopathy, unspecified: Secondary | ICD-10-CM | POA: Diagnosis present

## 2022-10-23 DIAGNOSIS — Z6839 Body mass index (BMI) 39.0-39.9, adult: Secondary | ICD-10-CM

## 2022-10-23 DIAGNOSIS — E119 Type 2 diabetes mellitus without complications: Secondary | ICD-10-CM | POA: Diagnosis present

## 2022-10-23 DIAGNOSIS — Z96651 Presence of right artificial knee joint: Secondary | ICD-10-CM

## 2022-10-23 DIAGNOSIS — T84012A Broken internal right knee prosthesis, initial encounter: Secondary | ICD-10-CM | POA: Diagnosis not present

## 2022-10-23 DIAGNOSIS — Z8673 Personal history of transient ischemic attack (TIA), and cerebral infarction without residual deficits: Secondary | ICD-10-CM

## 2022-10-23 DIAGNOSIS — G8918 Other acute postprocedural pain: Secondary | ICD-10-CM | POA: Diagnosis not present

## 2022-10-23 DIAGNOSIS — Z8249 Family history of ischemic heart disease and other diseases of the circulatory system: Secondary | ICD-10-CM | POA: Diagnosis not present

## 2022-10-23 DIAGNOSIS — T8484XA Pain due to internal orthopedic prosthetic devices, implants and grafts, initial encounter: Secondary | ICD-10-CM | POA: Diagnosis not present

## 2022-10-23 DIAGNOSIS — F17200 Nicotine dependence, unspecified, uncomplicated: Secondary | ICD-10-CM | POA: Diagnosis not present

## 2022-10-23 DIAGNOSIS — M25561 Pain in right knee: Secondary | ICD-10-CM | POA: Diagnosis present

## 2022-10-23 DIAGNOSIS — Z96659 Presence of unspecified artificial knee joint: Principal | ICD-10-CM

## 2022-10-23 DIAGNOSIS — T849XXA Unspecified complication of internal orthopedic prosthetic device, implant and graft, initial encounter: Secondary | ICD-10-CM | POA: Diagnosis not present

## 2022-10-23 DIAGNOSIS — T84092A Other mechanical complication of internal right knee prosthesis, initial encounter: Secondary | ICD-10-CM | POA: Diagnosis not present

## 2022-10-23 HISTORY — PX: TOTAL KNEE ARTHROPLASTY WITH REVISION COMPONENTS: SHX6198

## 2022-10-23 LAB — BASIC METABOLIC PANEL
Anion gap: 7 (ref 5–15)
BUN: 25 mg/dL — ABNORMAL HIGH (ref 8–23)
CO2: 25 mmol/L (ref 22–32)
Calcium: 9.2 mg/dL (ref 8.9–10.3)
Chloride: 107 mmol/L (ref 98–111)
Creatinine, Ser: 0.72 mg/dL (ref 0.61–1.24)
GFR, Estimated: >60 mL/min (ref >60)
Glucose, Bld: 116 mg/dL — ABNORMAL HIGH (ref 70–99)
Potassium: 4.3 mmol/L (ref 3.5–5.1)
Sodium: 139 mmol/L (ref 135–145)

## 2022-10-23 LAB — CBC WITH DIFFERENTIAL/PLATELET
Abs Immature Granulocytes: 0.01 10*3/uL (ref 0.00–0.07)
Basophils Absolute: 0 10*3/uL (ref 0.0–0.1)
Basophils Relative: 0 %
Eosinophils Absolute: 0.4 10*3/uL (ref 0.0–0.5)
Eosinophils Relative: 9 %
HCT: 43 % (ref 39.0–52.0)
Hemoglobin: 14.3 g/dL (ref 13.0–17.0)
Immature Granulocytes: 0 %
Lymphocytes Relative: 17 %
Lymphs Abs: 0.8 10*3/uL (ref 0.7–4.0)
MCH: 31.6 pg (ref 26.0–34.0)
MCHC: 33.3 g/dL (ref 30.0–36.0)
MCV: 95.1 fL (ref 80.0–100.0)
Monocytes Absolute: 0.5 10*3/uL (ref 0.1–1.0)
Monocytes Relative: 11 %
Neutro Abs: 3 10*3/uL (ref 1.7–7.7)
Neutrophils Relative %: 63 %
Platelets: 76 10*3/uL — ABNORMAL LOW (ref 150–400)
RBC: 4.52 MIL/uL (ref 4.22–5.81)
RDW: 14.3 % (ref 11.5–15.5)
WBC: 4.8 10*3/uL (ref 4.0–10.5)
nRBC: 0 % (ref 0.0–0.2)

## 2022-10-23 LAB — GLUCOSE, CAPILLARY
Glucose-Capillary: 112 mg/dL — ABNORMAL HIGH (ref 70–99)
Glucose-Capillary: 165 mg/dL — ABNORMAL HIGH (ref 70–99)

## 2022-10-23 SURGERY — TOTAL KNEE ARTHROPLASTY WITH REVISION COMPONENTS
Anesthesia: Regional | Site: Knee | Laterality: Right

## 2022-10-23 MED ORDER — OXYCODONE HCL 5 MG/5ML PO SOLN: 5.0000 mg | Freq: Once | ORAL | Status: AC | PRN

## 2022-10-23 MED ORDER — OXYCODONE HCL 5 MG PO TABS: 10.0000 mg | ORAL_TABLET | ORAL | Status: DC | PRN

## 2022-10-23 MED ORDER — ROCURONIUM BROMIDE 10 MG/ML (PF) SYRINGE
PREFILLED_SYRINGE | INTRAVENOUS | Status: AC
  Filled 2022-10-23: qty 10

## 2022-10-23 MED ORDER — METHOCARBAMOL 500 MG IVPB - SIMPLE MED
INTRAVENOUS | Status: AC
  Filled 2022-10-23: qty 55

## 2022-10-23 MED ORDER — EPHEDRINE SULFATE-NACL 50-0.9 MG/10ML-% IV SOSY
PREFILLED_SYRINGE | INTRAVENOUS | Status: DC | PRN
  Administered 2022-10-23 (×3): 5 mg via INTRAVENOUS

## 2022-10-23 MED ORDER — ONDANSETRON HCL 4 MG/2ML IJ SOLN: 4.0000 mg | Freq: Four times a day (QID) | INTRAMUSCULAR | Status: DC | PRN

## 2022-10-23 MED ORDER — LACTATED RINGERS IV SOLN: INTRAVENOUS | Status: DC

## 2022-10-23 MED ORDER — BUPIVACAINE HCL 0.25 % IJ SOLN
INTRAMUSCULAR | Status: AC
  Filled 2022-10-23: qty 1

## 2022-10-23 MED ORDER — SACUBITRIL-VALSARTAN 97-103 MG PO TABS
1.0000 | ORAL_TABLET | Freq: Two times a day (BID) | ORAL | Status: DC
  Administered 2022-10-24: 1 via ORAL
  Filled 2022-10-23: qty 1

## 2022-10-23 MED ORDER — DIPHENHYDRAMINE HCL 12.5 MG/5ML PO ELIX: 12.5000 mg | ORAL_SOLUTION | ORAL | Status: DC | PRN

## 2022-10-23 MED ORDER — POVIDONE-IODINE 7.5 % EX SOLN: Freq: Once | CUTANEOUS | Status: DC

## 2022-10-23 MED ORDER — POVIDONE-IODINE 10 % EX SWAB: 2.0000 | Freq: Once | CUTANEOUS | Status: DC

## 2022-10-23 MED ORDER — 0.9 % SODIUM CHLORIDE (POUR BTL) OPTIME
TOPICAL | Status: DC | PRN
  Administered 2022-10-23: 1000 mL

## 2022-10-23 MED ORDER — ACETAMINOPHEN 500 MG PO TABS
1000.0000 mg | ORAL_TABLET | Freq: Four times a day (QID) | ORAL | Status: AC
  Administered 2022-10-23 – 2022-10-24 (×4): 1000 mg via ORAL
  Filled 2022-10-23 (×4): qty 2

## 2022-10-23 MED ORDER — DOCUSATE SODIUM 100 MG PO CAPS
100.0000 mg | ORAL_CAPSULE | Freq: Two times a day (BID) | ORAL | Status: DC
  Administered 2022-10-23 – 2022-10-24 (×2): 100 mg via ORAL
  Filled 2022-10-23 (×2): qty 1

## 2022-10-23 MED ORDER — BUPIVACAINE-EPINEPHRINE (PF) 0.5% -1:200000 IJ SOLN
INTRAMUSCULAR | Status: DC | PRN
  Administered 2022-10-23: 30 mL via PERINEURAL

## 2022-10-23 MED ORDER — LORATADINE 10 MG PO TABS
10.0000 mg | ORAL_TABLET | Freq: Every day | ORAL | Status: DC
  Administered 2022-10-24: 10 mg via ORAL
  Filled 2022-10-23: qty 1

## 2022-10-23 MED ORDER — CHLORHEXIDINE GLUCONATE 0.12 % MT SOLN
15.0000 mL | Freq: Once | OROMUCOSAL | Status: AC
  Administered 2022-10-23: 15 mL via OROMUCOSAL

## 2022-10-23 MED ORDER — ONDANSETRON HCL 4 MG PO TABS: 4.0000 mg | ORAL_TABLET | Freq: Four times a day (QID) | ORAL | Status: DC | PRN

## 2022-10-23 MED ORDER — MIDAZOLAM HCL 2 MG/2ML IJ SOLN
2.0000 mg | INTRAMUSCULAR | Status: DC
  Administered 2022-10-23: 1 mg via INTRAVENOUS
  Filled 2022-10-23: qty 2

## 2022-10-23 MED ORDER — BUPIVACAINE HCL 0.25 % IJ SOLN
INTRAMUSCULAR | Status: DC | PRN
  Administered 2022-10-23: 30 mL

## 2022-10-23 MED ORDER — OXYCODONE HCL 5 MG PO TABS
5.0000 mg | ORAL_TABLET | ORAL | Status: DC | PRN
  Administered 2022-10-23 – 2022-10-24 (×4): 5 mg via ORAL
  Filled 2022-10-23 (×4): qty 1

## 2022-10-23 MED ORDER — METFORMIN HCL 500 MG PO TABS
500.0000 mg | ORAL_TABLET | Freq: Every day | ORAL | Status: DC
  Administered 2022-10-24: 500 mg via ORAL
  Filled 2022-10-23: qty 1

## 2022-10-23 MED ORDER — FENTANYL CITRATE PF 50 MCG/ML IJ SOSY
100.0000 ug | PREFILLED_SYRINGE | INTRAMUSCULAR | Status: DC
  Filled 2022-10-23: qty 2

## 2022-10-23 MED ORDER — CLOPIDOGREL BISULFATE 75 MG PO TABS
75.0000 mg | ORAL_TABLET | Freq: Every day | ORAL | Status: DC
  Administered 2022-10-24: 75 mg via ORAL
  Filled 2022-10-23: qty 1

## 2022-10-23 MED ORDER — ROCURONIUM BROMIDE 10 MG/ML (PF) SYRINGE
PREFILLED_SYRINGE | INTRAVENOUS | Status: DC | PRN
  Administered 2022-10-23: 80 mg via INTRAVENOUS

## 2022-10-23 MED ORDER — PROPOFOL 10 MG/ML IV BOLUS
INTRAVENOUS | Status: DC | PRN
  Administered 2022-10-23: 180 mg via INTRAVENOUS

## 2022-10-23 MED ORDER — KETOROLAC TROMETHAMINE 30 MG/ML IJ SOLN
INTRAMUSCULAR | Status: DC | PRN
  Administered 2022-10-23: 30 mg

## 2022-10-23 MED ORDER — PROPOFOL 10 MG/ML IV BOLUS
INTRAVENOUS | Status: AC
  Filled 2022-10-23: qty 20

## 2022-10-23 MED ORDER — ONDANSETRON HCL 4 MG/2ML IJ SOLN
INTRAMUSCULAR | Status: AC
  Filled 2022-10-23: qty 2

## 2022-10-23 MED ORDER — WATER FOR IRRIGATION, STERILE IR SOLN
Status: DC | PRN
  Administered 2022-10-23: 2000 mL

## 2022-10-23 MED ORDER — PROMETHAZINE HCL 25 MG/ML IJ SOLN: 6.2500 mg | INTRAMUSCULAR | Status: DC | PRN

## 2022-10-23 MED ORDER — ASPIRIN 81 MG PO CHEW
81.0000 mg | CHEWABLE_TABLET | Freq: Every day | ORAL | Status: DC
  Administered 2022-10-24: 81 mg via ORAL
  Filled 2022-10-23: qty 1

## 2022-10-23 MED ORDER — GLYCOPYRROLATE 0.2 MG/ML IJ SOLN
INTRAMUSCULAR | Status: DC | PRN
  Administered 2022-10-23: .2 mg via INTRAVENOUS

## 2022-10-23 MED ORDER — POVIDONE-IODINE 10 % EX SWAB
2.0000 | Freq: Once | CUTANEOUS | Status: AC
  Administered 2022-10-23: 2 via TOPICAL

## 2022-10-23 MED ORDER — HYDROMORPHONE HCL 1 MG/ML IJ SOLN
INTRAMUSCULAR | Status: DC | PRN
  Administered 2022-10-23 (×2): .5 mg via INTRAVENOUS
  Administered 2022-10-23: 1 mg via INTRAVENOUS

## 2022-10-23 MED ORDER — OXYCODONE HCL 5 MG PO TABS
ORAL_TABLET | ORAL | Status: AC
  Filled 2022-10-23: qty 1

## 2022-10-23 MED ORDER — ACETAMINOPHEN 325 MG PO TABS: 325.0000 mg | ORAL_TABLET | Freq: Four times a day (QID) | ORAL | Status: DC | PRN

## 2022-10-23 MED ORDER — MENTHOL 3 MG MT LOZG: 1.0000 | LOZENGE | OROMUCOSAL | Status: DC | PRN

## 2022-10-23 MED ORDER — CEFAZOLIN IN SODIUM CHLORIDE 3-0.9 GM/100ML-% IV SOLN
3.0000 g | INTRAVENOUS | Status: AC
  Administered 2022-10-23: 3 g via INTRAVENOUS
  Filled 2022-10-23: qty 100

## 2022-10-23 MED ORDER — METHOCARBAMOL 500 MG IVPB - SIMPLE MED
500.0000 mg | Freq: Four times a day (QID) | INTRAVENOUS | Status: DC | PRN
  Administered 2022-10-23: 500 mg via INTRAVENOUS

## 2022-10-23 MED ORDER — FENTANYL CITRATE PF 50 MCG/ML IJ SOSY
25.0000 ug | PREFILLED_SYRINGE | INTRAMUSCULAR | Status: DC | PRN
  Administered 2022-10-23: 50 ug via INTRAVENOUS

## 2022-10-23 MED ORDER — DEXAMETHASONE SODIUM PHOSPHATE 10 MG/ML IJ SOLN
INTRAMUSCULAR | Status: DC | PRN
  Administered 2022-10-23: 10 mg via INTRAVENOUS

## 2022-10-23 MED ORDER — EPHEDRINE 5 MG/ML INJ
INTRAVENOUS | Status: AC
  Filled 2022-10-23: qty 5

## 2022-10-23 MED ORDER — FINASTERIDE 5 MG PO TABS
5.0000 mg | ORAL_TABLET | Freq: Every day | ORAL | Status: DC
  Administered 2022-10-23: 5 mg via ORAL
  Filled 2022-10-23: qty 1

## 2022-10-23 MED ORDER — MAGNESIUM CITRATE PO SOLN: 1.0000 | Freq: Once | ORAL | Status: DC | PRN

## 2022-10-23 MED ORDER — AMISULPRIDE (ANTIEMETIC) 5 MG/2ML IV SOLN: 10.0000 mg | Freq: Once | INTRAVENOUS | Status: DC | PRN

## 2022-10-23 MED ORDER — ONDANSETRON HCL 4 MG/2ML IJ SOLN
INTRAMUSCULAR | Status: DC | PRN
  Administered 2022-10-23: 4 mg via INTRAVENOUS

## 2022-10-23 MED ORDER — FENTANYL CITRATE (PF) 100 MCG/2ML IJ SOLN
INTRAMUSCULAR | Status: DC | PRN
  Administered 2022-10-23: 100 ug via INTRAVENOUS

## 2022-10-23 MED ORDER — CARVEDILOL 6.25 MG PO TABS
6.2500 mg | ORAL_TABLET | Freq: Two times a day (BID) | ORAL | Status: DC
  Administered 2022-10-24: 6.25 mg via ORAL
  Filled 2022-10-23 (×2): qty 1

## 2022-10-23 MED ORDER — TAMSULOSIN HCL 0.4 MG PO CAPS
0.4000 mg | ORAL_CAPSULE | Freq: Every day | ORAL | Status: DC
  Administered 2022-10-23 – 2022-10-24 (×2): 0.4 mg via ORAL
  Filled 2022-10-23 (×2): qty 1

## 2022-10-23 MED ORDER — ROSUVASTATIN CALCIUM 20 MG PO TABS
20.0000 mg | ORAL_TABLET | Freq: Every day | ORAL | Status: DC
  Administered 2022-10-24: 20 mg via ORAL
  Filled 2022-10-23: qty 1

## 2022-10-23 MED ORDER — LIDOCAINE HCL (PF) 2 % IJ SOLN
INTRAMUSCULAR | Status: AC
  Filled 2022-10-23: qty 5

## 2022-10-23 MED ORDER — SUGAMMADEX SODIUM 200 MG/2ML IV SOLN
INTRAVENOUS | Status: DC | PRN
  Administered 2022-10-23: 200 mg via INTRAVENOUS

## 2022-10-23 MED ORDER — SODIUM CHLORIDE 0.9 % IR SOLN
Status: DC | PRN
  Administered 2022-10-23: 1000 mL

## 2022-10-23 MED ORDER — BISACODYL 10 MG RE SUPP: 10.0000 mg | Freq: Every day | RECTAL | Status: DC | PRN

## 2022-10-23 MED ORDER — KETOROLAC TROMETHAMINE 30 MG/ML IJ SOLN
INTRAMUSCULAR | Status: AC
  Filled 2022-10-23: qty 1

## 2022-10-23 MED ORDER — DEXAMETHASONE SODIUM PHOSPHATE 10 MG/ML IJ SOLN
INTRAMUSCULAR | Status: AC
  Filled 2022-10-23: qty 1

## 2022-10-23 MED ORDER — OXYCODONE HCL 5 MG PO TABS
5.0000 mg | ORAL_TABLET | Freq: Once | ORAL | Status: AC | PRN
  Administered 2022-10-23: 5 mg via ORAL

## 2022-10-23 MED ORDER — FOLIC ACID 1 MG PO TABS
0.5000 mg | ORAL_TABLET | Freq: Every day | ORAL | Status: DC
  Administered 2022-10-24: 0.5 mg via ORAL
  Filled 2022-10-23: qty 1

## 2022-10-23 MED ORDER — HYDROMORPHONE HCL 1 MG/ML IJ SOLN: 0.5000 mg | INTRAMUSCULAR | Status: DC | PRN

## 2022-10-23 MED ORDER — POLYETHYLENE GLYCOL 3350 17 G PO PACK: 17.0000 g | PACK | Freq: Every day | ORAL | Status: DC | PRN

## 2022-10-23 MED ORDER — METOCLOPRAMIDE HCL 5 MG/ML IJ SOLN: 5.0000 mg | Freq: Three times a day (TID) | INTRAMUSCULAR | Status: DC | PRN

## 2022-10-23 MED ORDER — METHOCARBAMOL 500 MG PO TABS
500.0000 mg | ORAL_TABLET | Freq: Four times a day (QID) | ORAL | Status: DC | PRN
  Administered 2022-10-23 – 2022-10-24 (×3): 500 mg via ORAL
  Filled 2022-10-23 (×3): qty 1

## 2022-10-23 MED ORDER — LIDOCAINE 2% (20 MG/ML) 5 ML SYRINGE
INTRAMUSCULAR | Status: DC | PRN
  Administered 2022-10-23: 100 mg via INTRAVENOUS

## 2022-10-23 MED ORDER — ALUM & MAG HYDROXIDE-SIMETH 200-200-20 MG/5ML PO SUSP: 30.0000 mL | ORAL | Status: DC | PRN

## 2022-10-23 MED ORDER — PHENOL 1.4 % MT LIQD: 1.0000 | OROMUCOSAL | Status: DC | PRN

## 2022-10-23 MED ORDER — HYDROMORPHONE HCL 2 MG/ML IJ SOLN
INTRAMUSCULAR | Status: AC
  Filled 2022-10-23: qty 1

## 2022-10-23 MED ORDER — GLYCOPYRROLATE 0.2 MG/ML IJ SOLN
INTRAMUSCULAR | Status: AC
  Filled 2022-10-23: qty 1

## 2022-10-23 MED ORDER — FENTANYL CITRATE PF 50 MCG/ML IJ SOSY
PREFILLED_SYRINGE | INTRAMUSCULAR | Status: AC
  Filled 2022-10-23: qty 1

## 2022-10-23 MED ORDER — ORAL CARE MOUTH RINSE: 15.0000 mL | Freq: Once | OROMUCOSAL | Status: AC

## 2022-10-23 MED ORDER — POTASSIUM CHLORIDE IN NACL 20-0.9 MEQ/L-% IV SOLN
INTRAVENOUS | Status: DC
  Filled 2022-10-23 (×2): qty 1000

## 2022-10-23 MED ORDER — FENTANYL CITRATE (PF) 100 MCG/2ML IJ SOLN
INTRAMUSCULAR | Status: AC
  Filled 2022-10-23: qty 2

## 2022-10-23 MED ORDER — CEFAZOLIN SODIUM-DEXTROSE 2-4 GM/100ML-% IV SOLN
2.0000 g | Freq: Four times a day (QID) | INTRAVENOUS | Status: AC
  Administered 2022-10-23 (×2): 2 g via INTRAVENOUS
  Filled 2022-10-23 (×2): qty 100

## 2022-10-23 MED ORDER — ACETAMINOPHEN 500 MG PO TABS
1000.0000 mg | ORAL_TABLET | Freq: Once | ORAL | Status: AC
  Administered 2022-10-23: 1000 mg via ORAL
  Filled 2022-10-23: qty 2

## 2022-10-23 MED ORDER — METOCLOPRAMIDE HCL 5 MG PO TABS: 5.0000 mg | ORAL_TABLET | Freq: Three times a day (TID) | ORAL | Status: DC | PRN

## 2022-10-23 MED ORDER — TRANEXAMIC ACID-NACL 1000-0.7 MG/100ML-% IV SOLN
1000.0000 mg | INTRAVENOUS | Status: AC
  Administered 2022-10-23: 1000 mg via INTRAVENOUS
  Filled 2022-10-23: qty 100

## 2022-10-23 MED ORDER — PROPOFOL 1000 MG/100ML IV EMUL
INTRAVENOUS | Status: AC
  Filled 2022-10-23: qty 100

## 2022-10-23 MED ORDER — TRANEXAMIC ACID-NACL 1000-0.7 MG/100ML-% IV SOLN
1000.0000 mg | Freq: Once | INTRAVENOUS | Status: AC
  Administered 2022-10-23: 1000 mg via INTRAVENOUS
  Filled 2022-10-23: qty 100

## 2022-10-23 SURGICAL SUPPLY — 61 items
BAG COUNTER SPONGE SURGICOUNT (BAG) IMPLANT
BAG ZIPLOCK 12X15 (MISCELLANEOUS) IMPLANT
BLADE SAG 18X100X1.27 (BLADE) ×1 IMPLANT
BLADE SAW SGTL 11.0X1.19X90.0M (BLADE) IMPLANT
BLADE SAW SGTL 13X75X1.27 (BLADE) ×1 IMPLANT
BLADE SURG 15 STRL LF DISP TIS (BLADE) ×1 IMPLANT
BLADE SURG 15 STRL SS (BLADE) ×1
BNDG ELASTIC 6X10 VLCR STRL LF (GAUZE/BANDAGES/DRESSINGS) ×1 IMPLANT
BOWL SMART MIX CTS (DISPOSABLE) ×1 IMPLANT
CEMENT HV SMART SET (Cement) ×2 IMPLANT
CLSR STERI-STRIP ANTIMIC 1/2X4 (GAUZE/BANDAGES/DRESSINGS) ×2 IMPLANT
COMP FEM CMT PERSONA SZ9 RT (Joint) ×1 IMPLANT
COMPONENT FEM CMT PRSONA SZ9RT (Joint) IMPLANT
COVER SURGICAL LIGHT HANDLE (MISCELLANEOUS) ×1 IMPLANT
CUFF TOURN SGL QUICK 34 (TOURNIQUET CUFF) ×1
CUFF TRNQT CYL 34X4.125X (TOURNIQUET CUFF) ×1 IMPLANT
DERMABOND ADVANCED .7 DNX12 (GAUZE/BANDAGES/DRESSINGS) IMPLANT
DRAPE SHEET LG 3/4 BI-LAMINATE (DRAPES) ×1 IMPLANT
DRAPE U-SHAPE 47X51 STRL (DRAPES) ×1 IMPLANT
DRSG MEPILEX POST OP 4X12 (GAUZE/BANDAGES/DRESSINGS) ×1 IMPLANT
DRSG MEPILEX POST OP 4X8 (GAUZE/BANDAGES/DRESSINGS) IMPLANT
DURAPREP 26ML APPLICATOR (WOUND CARE) ×2 IMPLANT
ELECT REM PT RETURN 15FT ADLT (MISCELLANEOUS) ×1 IMPLANT
GAUZE PAD ABD 8X10 STRL (GAUZE/BANDAGES/DRESSINGS) ×2 IMPLANT
GLOVE BIO SURGEON STRL SZ 6.5 (GLOVE) ×1 IMPLANT
GLOVE BIO SURGEON STRL SZ7.5 (GLOVE) ×1 IMPLANT
GLOVE BIOGEL PI IND STRL 7.0 (GLOVE) ×1 IMPLANT
GLOVE BIOGEL PI IND STRL 8 (GLOVE) ×1 IMPLANT
GOWN STRL SURGICAL XL XLNG (GOWN DISPOSABLE) ×2 IMPLANT
HANDPIECE INTERPULSE COAX TIP (DISPOSABLE) ×1
HDLS TROCR DRIL PIN KNEE 75 (PIN) ×1
HOLDER FOLEY CATH W/STRAP (MISCELLANEOUS) IMPLANT
IMMOBILIZER KNEE 20 (SOFTGOODS) ×1
IMMOBILIZER KNEE 20 THIGH 36 (SOFTGOODS) ×1 IMPLANT
IMMOBILIZER KNEE 22 UNIV (SOFTGOODS) IMPLANT
INSERT TIBIAL PERSONA 10 RT (Insert) IMPLANT
KIT TURNOVER KIT A (KITS) IMPLANT
MANIFOLD NEPTUNE II (INSTRUMENTS) ×1 IMPLANT
NS IRRIG 1000ML POUR BTL (IV SOLUTION) ×1 IMPLANT
PACK TOTAL KNEE CUSTOM (KITS) ×1 IMPLANT
PIN DRILL HDLS TROCAR 75 4PK (PIN) IMPLANT
PROTECTOR NERVE ULNAR (MISCELLANEOUS) ×1 IMPLANT
SCREW FEMALE HEX FIX 25X2.5 (ORTHOPEDIC DISPOSABLE SUPPLIES) IMPLANT
SET HNDPC FAN SPRY TIP SCT (DISPOSABLE) ×1 IMPLANT
SET PAD KNEE POSITIONER (MISCELLANEOUS) ×1 IMPLANT
SPIKE FLUID TRANSFER (MISCELLANEOUS) IMPLANT
STEM POLY PAT PLY 35M KNEE (Knees) IMPLANT
STEM TIBIA 5 DEG SZ G R KNEE (Knees) IMPLANT
SUT VIC AB 1 CT1 27 (SUTURE) ×1
SUT VIC AB 1 CT1 27XBRD ANTBC (SUTURE) IMPLANT
SUT VIC AB 1 CT1 36 (SUTURE) ×2 IMPLANT
SUT VIC AB 2-0 CT1 27 (SUTURE) ×3
SUT VIC AB 2-0 CT1 TAPERPNT 27 (SUTURE) ×1 IMPLANT
SUT VIC AB 3-0 SH 27 (SUTURE) ×1
SUT VIC AB 3-0 SH 27X BRD (SUTURE) IMPLANT
TIBIA STEM 5 DEG SZ G R KNEE (Knees) ×1 IMPLANT
TIBIAL INSERT PERSONA 10 RT (Insert) ×2 IMPLANT
TRAY FOLEY MTR SLVR 16FR STAT (SET/KITS/TRAYS/PACK) ×1 IMPLANT
TUBE SUCTION HIGH CAP CLEAR NV (SUCTIONS) ×1 IMPLANT
WATER STERILE IRR 1000ML POUR (IV SOLUTION) ×2 IMPLANT
WRAP KNEE MAXI GEL POST OP (GAUZE/BANDAGES/DRESSINGS) ×1 IMPLANT

## 2022-10-23 NOTE — Op Note (Signed)
DATE OF SURGERY:  10/23/2022 TIME: 12:36 PM  PATIENT NAME:  Gordon Madden   AGE: 71 y.o.    PRE-OPERATIVE DIAGNOSIS: Failed right partial knee replacement  POST-OPERATIVE DIAGNOSIS:  Same  PROCEDURE: Conversion of right partial knee replacement to total Knee Arthroplasty, including femoral and tibial components  SURGEON:  Johnny Bridge, MD   ASSISTANT:  Merlene Pulling, PA-C, present and scrubbed throughout the case, critical for assistance with exposure, retraction, instrumentation, and closure.   OPERATIVE IMPLANTS:\  Implant Name Type Inv. Item Serial No. Manufacturer Lot No. LRB No. Used Action  CEMENT HV SMART SET - [id number] Cement CEMENT HV SMART SET  [employer] [id number] Right 2 Implanted  TIBIAL INSERT PERSONA 10 RT - [id number] Insert TIBIAL INSERT PERSONA 10 RT  ZIMMER [employer](ORTH,TRAU,BIO,SG) [id number] Right 1 Implanted  TIBIA STEM 5 DEG SZ G R KNEE - [id number] Knees TIBIA STEM 5 DEG SZ G R KNEE  [employer]IMMER [employer]ON(ORTH,TRAU,BIO,SG) [id number] Right 1 Implanted  COMP FEM CMT PERSONA SZ9 RT - [id number] Joint COMP FEM CMT PERSONA SZ9 RT  [employer](ORTH,TRAU,BIO,SG) [id number] Right 1 Implanted  STEM POLY PAT PLY 42M KNEE - [id number] Knees STEM POLY PAT PLY 42M KNEE  [employer]IMMER [employer]ON(ORTH,TRAU,BIO,SG) [id number] Right 1 Implanted  TIBIAL INSERT PERSONA 10 RT - [id number] Insert TIBIAL INSERT PERSONA 10 RT  ZIMMER [employer]ON(ORTH,TRAU,BIO,SG) [id number] Right 1 Implanted     PREOPERATIVE INDICATIONS:  Gordon Madden is a 71 y.o. year old male who had a previous right partial knee replacement and complained of significant diffuse pain around the right knee that failed conservative measures.  Radiographs demonstrated some progression of degenerative changes.  He elected for conversion to total knee replacement.  The risks, benefits, and alternatives were discussed at length including but not limited to the risks of infection, bleeding, nerve injury, stiffness, blood clots, the need for  revision surgery, cardiopulmonary complications, among others, and they were willing to proceed.  OPERATIVE FINDINGS AND UNIQUE ASPECTS OF THE CASE: He did in fact have fairly advanced diffuse grade 3 changes on the femoral trochlea and grade 3 changes diffusely on the lateral femoral condyle.  Some areas of grade 4 changes as well.  The tibial and femoral components were not loose, and the polyethylene felt stable.  I used Whitesides lines to orient the distal femur, the patella tracked with a no thumbs technique.  I was between a size 9 and a size 10 on the femur, but the 9 felt appropriate, I may have had just a small amount of notching.  This was lefts then a cortical thickness.  The polyethylene initially did not want to seat appropriately, and I ended up allowing the cement to cure, and then replaced it with a second polyethylene to ensure that it sat down well.  Even after pushing it in, did not want a Foley seat although when I brought it into full extension it then appeared to be fully seated and felt stable.  ESTIMATED BLOOD LOSS: 200 mL  Operative cultures: I sent fluid from the synovial fluid as well as tissue, although clinically it did not appear infected.  OPERATIVE DESCRIPTION:  The patient was brought to the operative room and placed in a supine position.  Anesthesia was administered.  IV antibiotics were given.  The lower extremity was prepped and draped in the usual sterile fashion.  Time out was performed.  The leg was elevated and exsanguinated and the tourniquet was inflated.  Anterior quadriceps tendon splitting approach was performed.  The patella was everted and osteophytes were removed.  The anterior horn of the medial and lateral meniscus was removed.   The patella was then measured, and cut with the saw.  The thickness before the cut was 22 and after the cut was 14.  A metal shield was used to protect the patella throughout the case.    The distal femur was opened with  the drill and the intramedullary distal femoral cutting jig was utilized, set at 5 degrees resecting 10 mm off the distal femur.  Care was taken to protect the collateral ligaments.  I resected the lateral side, and then did a partial resection of the medial side, and then used an osteotome to remove the femoral component, I had almost no bone loss.  I replaced the distal femoral cutting jig, and completed the medial cut.  Then the extramedullary tibial cutting jig was utilized making the appropriate cut using the anterior tibial crest as a reference building in appropriate posterior slope.  Care was taken during the cut to protect the medial and collateral ligaments.  The proximal tibia was removed along with the posterior horns of the menisci.  The PCL was sacrificed.  Initially I just cut the lateral side, and then remove the tibial component, and then completed the cut.  I referenced 10 mm off of the lateral side.  I had almost no bone loss on the tibial medial side.  The extensor gap was measured and found to have adequate resection, measuring to a size 10.  The distal femoral sizing jig was applied, taking care to avoid notching.  I used Whitesides line to orient the rotation, and placed a osteotome under the medial aspect of the posterior femoral condyle to correct the bone loss.  Then the 4-in-1 cutting jig was applied and the anterior and posterior femur was cut, along with the chamfer cuts.  All posterior osteophytes were removed.  The flexion gap was then measured and was symmetric with the extension gap.  The proximal tibia sized and prepared accordingly with the reamer and the punch, and then all components were trialed with the poly insert.  The knee was found to have excellent balance and full motion.    The above named components were then cemented into place and all excess cement was removed.  The real polyethylene implant was placed.  After the cement had cured I released the tourniquet  and confirmed excellent hemostasis with no major posterior vessel injury.    The knee was easily taken through a range of motion and the patella tracked well and the knee irrigated copiously and the parapatellar and subcutaneous tissue closed with vicryl, and monocryl with steri strips for the skin.  The wounds were injected with marcaine, and dressed with sterile gauze and the patient was awakened and returned to the PACU in stable and satisfactory condition.  There were no complications.  Total tourniquet time was 105 minutes.

## 2022-10-23 NOTE — Anesthesia Procedure Notes (Signed)
Procedure Name: Intubation Date/Time: 10/23/2022 10:04 AM  Performed by: Tyliek Timberman D, CRNAPre-anesthesia Checklist: Patient identified, Emergency Drugs available, Suction available and Patient being monitored Patient Re-evaluated:Patient Re-evaluated prior to induction Oxygen Delivery Method: Circle system utilized Preoxygenation: Pre-oxygenation with 100% oxygen Induction Type: IV induction Ventilation: Mask ventilation without difficulty Laryngoscope Size: Mac and 4 Grade View: Grade I Tube type: Oral Tube size: 7.5 mm Number of attempts: 1 Airway Equipment and Method: Stylet and Oral airway Placement Confirmation: ETT inserted through vocal cords under direct vision, positive ETCO2 and breath sounds checked- equal and bilateral Secured at: 22 cm Tube secured with: Tape Dental Injury: Teeth and Oropharynx as per pre-operative assessment

## 2022-10-23 NOTE — Transfer of Care (Signed)
Immediate Anesthesia Transfer of Care Note  Patient: Gordon Madden  Procedure(s) Performed: TOTAL KNEE ARTHROPLASTY WITH REVISION COMPONENTS (Right: Knee)  Patient Location: PACU  Anesthesia Type:General and Regional  Level of Consciousness: awake, alert , and oriented  Airway & Oxygen Therapy: Patient Spontanous Breathing and Patient connected to face mask oxygen  Post-op Assessment: Report given to RN and Post -op Vital signs reviewed and stable  Post vital signs: Reviewed and stable  Last Vitals:  Vitals Value Taken Time  BP 138/116 10/23/22 1313  Temp    Pulse 89 10/23/22 1315  Resp 19 10/23/22 1315  SpO2 97 % 10/23/22 1315  Vitals shown include unvalidated device data.  Last Pain:  Vitals:   10/23/22 0950  TempSrc:   PainSc: 0-No pain         Complications: No notable events documented.

## 2022-10-23 NOTE — Plan of Care (Signed)

## 2022-10-23 NOTE — Interval H&P Note (Signed)
History and Physical Interval Note:  10/23/2022 8:58 AM  Gordon Madden  has presented today for surgery, with the diagnosis of RIGHT KNEE HARDWARE COMPLICATIONS.  The various methods of treatment have been discussed with the patient and family. After consideration of risks, benefits and other options for treatment, the patient has consented to  Procedure(s): TOTAL KNEE ARTHROPLASTY WITH REVISION COMPONENTS (Right) as a surgical intervention.  The patient's history has been reviewed, patient examined, no change in status, stable for surgery.  I have reviewed the patient's chart and labs.  Questions were answered to the patient's satisfaction.  The primary indication for revision surgery is pain.  His radiographs have some mild to moderate degenerative changes in the patellofemoral and lateral compartment although it does not look like full-thickness chondral loss, but his pain has been persistent and unrelenting.  We will also plan to culture to make sure were not dealing with any type of occult infection.   Johnny Bridge

## 2022-10-23 NOTE — Evaluation (Signed)
Physical Therapy Evaluation Patient Details Name: Gordon Madden MRN: [id number] DOB: Dec 29, 1950 Today's Date: 10/23/2022  History of Present Illness  Pt is a 71yo male presenting s/p conversion of R uni knee to R total knee on 10/23/22. PMH: BPH, CAD, CHF, DM, gout, HLD, HTN, hx of MI, OSA on CPAP, hx of stroke, R-UKA, L-TKA 2016   Clinical Impression  Gordon Madden is a 70 y.o. male POD 0 s/p R-TKA (conversion from unilateral to total knee). Patient reports IND with mobility at baseline though does self-endorse "I am a high fall risk, I fall all the time." Patient is now limited by functional impairments (see PT problem list below) and requires min guard for bed mobility and mod assist for transfers; pt had single posterior LOB that required mod assist to correct (see section below). Patient instructed in exercise to facilitate ROM and circulation to manage edema. Provided incentive spirometer and with Vcs pt able to achieve 2278m. Patient will benefit from continued skilled PT interventions to address impairments and progress towards PLOF. Acute PT will follow to progress mobility and stair training in preparation for safe discharge home.     BP Supine in bed prior to mobility: 106/82  BP sitting EOB: 126/85.   Recommendations for follow up therapy are one component of a multi-disciplinary discharge planning process, led by the attending physician.  Recommendations may be updated based on patient status, additional functional criteria and insurance authorization.  Follow Up Recommendations Follow physician's recommendations for discharge plan and follow up therapies      Assistance Recommended at Discharge Frequent or constant Supervision/Assistance  Patient can return home with the following  Help with stairs or ramp for entrance;Assist for transportation;Assistance with cooking/housework;A little help with bathing/dressing/bathroom;A little help with walking and/or transfers     Equipment Recommendations None recommended by PT  Recommendations for Other Services       Functional Status Assessment Patient has had a recent decline in their functional status and demonstrates the ability to make significant improvements in function in a reasonable and predictable amount of time.     Precautions / Restrictions Precautions Precautions: Fall;Knee Precaution Booklet Issued: No Precaution Comments: no pillow under the knee. prior history of stroke, reports no deficits but calls himself a high fall risk. Required Braces or Orthoses: Knee Immobilizer - Right Knee Immobilizer - Right: On when out of bed or walking;Discontinue once straight leg raise with < 10 degree lag Restrictions Weight Bearing Restrictions: Yes RLE Weight Bearing: Weight bearing as tolerated      Mobility  Bed Mobility Overal bed mobility: Needs Assistance Bed Mobility: Supine to Sit     Supine to sit: Min guard, HOB elevated     General bed mobility comments: For safety only. Pt reporting baseline level of mild dizziness that persisted throughout session without change.    Transfers Overall transfer level: Needs assistance Equipment used: Rolling walker (2 wheels) Transfers: Sit to/from Stand, Bed to chair/wheelchair/BSC Sit to Stand: Min assist, From elevated surface, +2 safety/equipment   Step pivot transfers: Min guard, +2 safety/equipment       General transfer comment: Sit to stand: Pt required min assist for light lift assist and to bring weight forward over toes. Pt able to stand and shift weight laterally. +2 for safety, no additional physical assist. Step Pivot: min assist for steadying and AD management; pt did have one posterior LOB that required mod assist to correct, pt unsure what happened, reports that this type of  posterior LOB is very common. RN notified.    Ambulation/Gait               General Gait Details: Unsafe at present  Stairs             Wheelchair Mobility    Modified Rankin (Stroke Patients Only)       Balance Overall balance assessment: Needs assistance Sitting-balance support: No upper extremity supported, Feet supported Sitting balance-Leahy Scale: Good     Standing balance support: Reliant on assistive device for balance, During functional activity, Bilateral upper extremity supported Standing balance-Leahy Scale: Poor                               Pertinent Vitals/Pain Pain Assessment Pain Assessment: 0-10 Pain Score: 6  Pain Location: right knee Pain Descriptors / Indicators: Operative site guarding Pain Intervention(s): Limited activity within patient's tolerance, Monitored during session, Repositioned, Ice applied    Home Living Family/patient expects to be discharged to:: Private residence Living Arrangements: Spouse/significant other Available Help at Discharge: Family;Available 24 hours/day Type of Home: House Home Access: Stairs to enter Entrance Stairs-Rails: None ("I have used the RW going up the stairs before") Entrance Stairs-Number of Steps: 2   Home Layout: One level Home Equipment: Conservation officer, nature (2 wheels);BSC/3in1;Crutches;Shower seat;Cane - single point;Hand held shower head      Prior Function Prior Level of Function : Independent/Modified Independent;Driving             Mobility Comments: IND ADLs Comments: IND     Hand Dominance   Dominant Hand: Right    Extremity/Trunk Assessment   Upper Extremity Assessment Upper Extremity Assessment: Overall WFL for tasks assessed    Lower Extremity Assessment Lower Extremity Assessment: RLE deficits/detail;LLE deficits/detail RLE Deficits / Details: MMT ank DF/PF 5/5, unable to perform SLR (knee extensor lag present, heel remains on bed) RLE Sensation: WNL LLE Deficits / Details: MMT ank DF/PF 5/5 LLE Sensation: WNL    Cervical / Trunk Assessment Cervical / Trunk Assessment: Normal  Communication    Communication: No difficulties  Cognition Arousal/Alertness: Awake/alert Behavior During Therapy: WFL for tasks assessed/performed Overall Cognitive Status: Within Functional Limits for tasks assessed                                          General Comments General comments (skin integrity, edema, etc.): Wife Alinda Sierras present    Exercises Total Joint Exercises Ankle Circles/Pumps: AROM, Both, 20 reps   Assessment/Plan    PT Assessment Patient needs continued PT services  PT Problem List Decreased strength;Decreased range of motion;Decreased activity tolerance;Decreased balance;Decreased mobility;Decreased coordination;Pain       PT Treatment Interventions DME instruction;Gait training;Stair training;Functional mobility training;Therapeutic activities;Therapeutic exercise;Balance training;Neuromuscular re-education;Patient/family education    PT Goals (Current goals can be found in the Care Plan section)  Acute Rehab PT Goals Patient Stated Goal: Walk briskly without any pain PT Goal Formulation: With patient Time For Goal Achievement: 10/30/22 Potential to Achieve Goals: Good    Frequency 7X/week     Co-evaluation               AM-PAC PT "6 Clicks" Mobility  Outcome Measure Help needed turning from your back to your side while in a flat bed without using bedrails?: None Help needed moving from lying on your back to sitting  on the side of a flat bed without using bedrails?: A Little Help needed moving to and from a bed to a chair (including a wheelchair)?: A Little Help needed standing up from a chair using your arms (e.g., wheelchair or bedside chair)?: A Little Help needed to walk in hospital room?: A Little Help needed climbing 3-5 steps with a railing? : A Lot 6 Click Score: 18    End of Session Equipment Utilized During Treatment: Gait belt;Right knee immobilizer Activity Tolerance: Patient tolerated treatment well;No increased pain Patient  left: in chair;with call bell/phone within reach;with chair alarm set;with family/visitor present;with SCD's reapplied Nurse Communication: Mobility status PT Visit Diagnosis: Pain;Difficulty in walking, not elsewhere classified (R26.2) Pain - Right/Left: Right Pain - part of body: Knee    Time: 6789-3810 PT Time Calculation (min) (ACUTE ONLY): 27 min   Charges:   PT Evaluation $PT Eval Low Complexity: 1 Low PT Treatments $Therapeutic Activity: 8-22 mins        Coolidge Breeze, PT, DPT [facility] Office: [phone] Weekend pager: [phone]  Coolidge Breeze 10/23/2022, 5:51 PM

## 2022-10-23 NOTE — Anesthesia Procedure Notes (Signed)
Anesthesia Regional Block: Adductor canal block   Pre-Anesthetic Checklist: , timeout performed,  Correct Patient, Correct Site, Correct Laterality,  Correct Procedure,, site marked,  Risks and benefits discussed,  Surgical consent,  Pre-op evaluation,  At surgeon's request and post-op pain management  Laterality: Right  Prep: chloraprep       Needles:  Injection technique: Single-shot  Needle Type: Echogenic Stimulator Needle     Needle Length: 10cm  Needle Gauge: 20     Additional Needles:   Procedures:,,,, ultrasound used (permanent image in chart),,    Narrative:  Start time: 10/23/2022 9:40 AM End time: 10/23/2022 9:50 AM Injection made incrementally with aspirations every 5 mL.  Performed by: Personally  Anesthesiologist: Murvin Natal, MD  Additional Notes: Functioning IV was confirmed and monitors were applied. A time-out was performed. Hand hygiene and sterile gloves were used. The thigh was placed in a frog-leg position and prepped in a sterile fashion. A 150m 20ga Bbraun echogenic stimulator needle was placed using ultrasound guidance.  Negative aspiration and negative test dose prior to incremental administration of local anesthetic. The patient tolerated the procedure well.

## 2022-10-23 NOTE — Anesthesia Postprocedure Evaluation (Signed)
Anesthesia Post Note  Patient: Gordon Madden  Procedure(s) Performed: TOTAL KNEE ARTHROPLASTY WITH REVISION COMPONENTS (Right: Knee)     Patient location during evaluation: PACU Anesthesia Type: Regional and General Level of consciousness: awake Pain management: pain level controlled Vital Signs Assessment: post-procedure vital signs reviewed and stable Respiratory status: spontaneous breathing, nonlabored ventilation and respiratory function stable Cardiovascular status: blood pressure returned to baseline and stable Postop Assessment: no apparent nausea or vomiting Anesthetic complications: no   No notable events documented.  Last Vitals:  Vitals:   10/23/22 1415 10/23/22 1442  BP: 111/70 118/78  Pulse: (!) 50 72  Resp: 14 16  Temp:  36.4 C  SpO2: 94% 95%    Last Pain:  Vitals:   10/23/22 1442  TempSrc: Oral  PainSc: 7                  Rodert Hinch P Shelia Magallon

## 2022-10-24 ENCOUNTER — Encounter (HOSPITAL_COMMUNITY): Payer: Self-pay | Admitting: Orthopedic Surgery

## 2022-10-24 LAB — CBC
HCT: 33.9 % — ABNORMAL LOW (ref 39.0–52.0)
Hemoglobin: 11.1 g/dL — ABNORMAL LOW (ref 13.0–17.0)
MCH: 31.7 pg (ref 26.0–34.0)
MCHC: 32.7 g/dL (ref 30.0–36.0)
MCV: 96.9 fL (ref 80.0–100.0)
Platelets: 84 10*3/uL — ABNORMAL LOW (ref 150–400)
RBC: 3.5 MIL/uL — ABNORMAL LOW (ref 4.22–5.81)
RDW: 14.3 % (ref 11.5–15.5)
WBC: 8.6 10*3/uL (ref 4.0–10.5)
nRBC: 0 % (ref 0.0–0.2)

## 2022-10-24 LAB — BASIC METABOLIC PANEL
Anion gap: 5 (ref 5–15)
BUN: 28 mg/dL — ABNORMAL HIGH (ref 8–23)
CO2: 27 mmol/L (ref 22–32)
Calcium: 8 mg/dL — ABNORMAL LOW (ref 8.9–10.3)
Chloride: 103 mmol/L (ref 98–111)
Creatinine, Ser: 1 mg/dL (ref 0.61–1.24)
GFR, Estimated: >60 mL/min (ref >60)
Glucose, Bld: 163 mg/dL — ABNORMAL HIGH (ref 70–99)
Potassium: 4.7 mmol/L (ref 3.5–5.1)
Sodium: 135 mmol/L (ref 135–145)

## 2022-10-24 MED ORDER — OXYCODONE HCL 5 MG PO TABS: 5.0000 mg | ORAL_TABLET | ORAL | 0 refills | Status: DC | PRN

## 2022-10-24 MED ORDER — SENNA-DOCUSATE SODIUM 8.6-50 MG PO TABS: 2.0000 | ORAL_TABLET | Freq: Every day | ORAL | 1 refills | Status: DC

## 2022-10-24 MED ORDER — ONDANSETRON HCL 4 MG PO TABS: 4.0000 mg | ORAL_TABLET | Freq: Three times a day (TID) | ORAL | 0 refills | Status: DC | PRN

## 2022-10-24 MED ORDER — ASPIRIN 81 MG PO TBEC: 81.0000 mg | DELAYED_RELEASE_TABLET | Freq: Every day | ORAL | 0 refills | Status: AC

## 2022-10-24 NOTE — Progress Notes (Signed)
Physical Therapy Treatment Patient Details Name: Gordon Madden MRN: [id number] DOB: [birth year] Today's Date: 10/24/2022   History of Present Illness Pt is a 71yo male presenting s/p conversion of R uni knee to R total knee on 10/23/22. PMH: BPH, CAD, CHF, DM, gout, HLD, HTN, hx of MI, OSA on CPAP, hx of stroke, R-UKA, L-TKA 2016    PT Comments    Pt able to ambulate 100 ft with RW min guard, maintaining R KI for pt comfort, no knee buckling noted, slow step-to pattern. Educated pt on quad activation in stance, purpose of KI and progressing away from [place] to improve knee AROM and heel-toe gait pattern. Educated pt on stair training, used 6" curb to simulate home set-up, cued for sequencing and foot postioning closer before rising and lowering on/off curb. Pt ascends/descends step with RW, min guard for safety, therapist managing IV pole. Pt performs STS from elevated bed, cues for hand placement and RW positioning to power to stand and for controlled lowering to sitting. Provided written/illustrated HEP for pt to review, plan to perform exercises in afternoon session.    Recommendations for follow up therapy are one component of a multi-disciplinary discharge planning process, led by the attending physician.  Recommendations may be updated based on patient status, additional functional criteria and insurance authorization.  Follow Up Recommendations  Follow physician's recommendations for discharge plan and follow up therapies     Assistance Recommended at Discharge Frequent or constant Supervision/Assistance  Patient can return home with the following Help with stairs or ramp for entrance;Assist for transportation;Assistance with cooking/housework;A little help with bathing/dressing/bathroom;A little help with walking and/or transfers   Equipment Recommendations  None recommended by PT    Recommendations for Other Services       Precautions / Restrictions Precautions Precautions:  Fall;Knee Precaution Booklet Issued: No Precaution Comments: no pillow under the knee. prior history of stroke, reports no deficits but calls himself a high fall risk. Required Braces or Orthoses: Knee Immobilizer - Right Knee Immobilizer - Right: On when out of bed or walking;Discontinue once straight leg raise with < 10 degree lag Restrictions Weight Bearing Restrictions: No RLE Weight Bearing: Weight bearing as tolerated     Mobility  Bed Mobility Overal bed mobility: Needs Assistance Bed Mobility: Supine to Sit  Supine to sit: Min guard  General bed mobility comments: increased time to mobilize RLE to EOB, no physical assist    Transfers Overall transfer level: Needs assistance Equipment used: Rolling walker (2 wheels) Transfers: Sit to/from Stand Sit to Stand: Min guard, From elevated surface  General transfer comment: pt using BUE to assist in powering up, R KI on so slightly extended    Ambulation/Gait Ambulation/Gait assistance: Min guard Gait Distance (Feet): 100 Feet Assistive device: Rolling walker (2 wheels) Gait Pattern/deviations: Step-to pattern, Decreased stride length, Decreased stance time - right, Decreased weight shift to right Gait velocity: decreased  General Gait Details: step-to pattern, good steadiness without overt LOB or knee buckling noted, maintained KI for pt comfort, step to pattern wtih decreased R weight-bearing and stance time   Stairs Stairs: Yes Stairs assistance: Min guard Stair Management: With walker, Forwards Number of Stairs: 2 General stair comments: pt ascends/descends 2 curbs with RW to simulate wide home steps, cues for sequencing and foot positioning closer to step   Wheelchair Mobility    Modified Rankin (Stroke Patients Only)       Balance Overall balance assessment: Needs assistance Sitting-balance support: No upper extremity supported,  Feet supported Sitting balance-Leahy Scale: Good  Standing balance support:  Reliant on assistive device for balance, During functional activity, Bilateral upper extremity supported Standing balance-Leahy Scale: Poor     Cognition Arousal/Alertness: Awake/alert Behavior During Therapy: WFL for tasks assessed/performed Overall Cognitive Status: Within Functional Limits for tasks assessed     Exercises      General Comments        Pertinent Vitals/Pain Pain Assessment Pain Assessment: 0-10 Pain Score:  (5/10 at rest, 9/10 with ambulation) Pain Location: right knee Pain Descriptors / Indicators: Sore, Tender Pain Intervention(s): Limited activity within patient's tolerance, Monitored during session, Premedicated before session, Repositioned, Ice applied    Home Living                          Prior Function            PT Goals (current goals can now be found in the care plan section) Acute Rehab PT Goals Patient Stated Goal: Walk briskly without any pain PT Goal Formulation: With patient Time For Goal Achievement: 10/30/22 Potential to Achieve Goals: Good Progress towards PT goals: Progressing toward goals    Frequency    7X/week      PT Plan Current plan remains appropriate    Co-evaluation              AM-PAC PT "6 Clicks" Mobility   Outcome Measure  Help needed turning from your back to your side while in a flat bed without using bedrails?: None Help needed moving from lying on your back to sitting on the side of a flat bed without using bedrails?: A Little Help needed moving to and from a bed to a chair (including a wheelchair)?: A Little Help needed standing up from a chair using your arms (e.g., wheelchair or bedside chair)?: A Little Help needed to walk in hospital room?: A Little Help needed climbing 3-5 steps with a railing? : A Little 6 Click Score: 19    End of Session Equipment Utilized During Treatment: Gait belt;Right knee immobilizer Activity Tolerance: Patient tolerated treatment well Patient left:  in chair;with call bell/phone within reach;with chair alarm set Nurse Communication: Mobility status PT Visit Diagnosis: Pain;Difficulty in walking, not elsewhere classified (R26.2) Pain - Right/Left: Right Pain - part of body: Knee     Time: 1046-1130 PT Time Calculation (min) (ACUTE ONLY): 44 min  Charges:  $Gait Training: 23-37 mins $Therapeutic Exercise: 8-22 mins                      Tori Keven Soucy PT, DPT 10/24/22, 12:36 PM

## 2022-10-24 NOTE — Progress Notes (Signed)
Subjective: 1 Day Post-Op s/p Procedure(s): TOTAL KNEE ARTHROPLASTY WITH REVISION COMPONENTS   Patient is alert, oriented. States pain has far been under control, very happy with anesthesia yesterday. No BM yet. Voided while in the room with urinal at bedside. Has had varying blood pressures yesterday depending on which BP cuff was used and arm vs. Wrist. Denies chest pain, SOB, Calf pain. No nausea/vomiting. No other complaints.    Objective:  PE: VITALS:   Vitals:   10/23/22 1738 10/23/22 2256 10/24/22 0215 10/24/22 0624  BP: 102/67 105/75 100/65 99/63  Pulse: (!) 54 68 65 (!) 54  Resp: '18 17 17 17  '$ Temp: 98.2 F (36.8 C) 98 F (36.7 C) 98 F (36.7 C) 97.6 F (36.4 C)  TempSrc: Oral Oral Oral Oral  SpO2: 92% 95% 95% 98%  Weight:      Height:       General: sitting up in bed, eating breakfast, in no acute distress Resp: normal resp effort GI: soft MSK:  Sensation intact distally Intact pulses distally Dorsiflexion/Plantar flexion intact Incision: dressing C/D/I  LABS  Results for orders placed or performed during the hospital encounter of 10/23/22 (from the past 24 hour(s))  Glucose, capillary     Status: Abnormal   Collection Time: 10/23/22  8:48 AM  Result Value Ref Range   Glucose-Capillary 112 (H) 70 - 99 mg/dL   Comment 1 Notify RN    Comment 2 Document in Chart   CBC with Differential/Platelet     Status: Abnormal   Collection Time: 10/23/22  9:16 AM  Result Value Ref Range   WBC 4.8 4.0 - 10.5 K/uL   RBC 4.52 4.22 - 5.81 MIL/uL   Hemoglobin 14.3 13.0 - 17.0 g/dL   HCT 43.0 39.0 - 52.0 %   MCV 95.1 80.0 - 100.0 fL   MCH 31.6 26.0 - 34.0 pg   MCHC 33.3 30.0 - 36.0 g/dL   RDW 14.3 11.5 - 15.5 %   Platelets 76 (L) 150 - 400 K/uL   nRBC 0.0 0.0 - 0.2 %   Neutrophils Relative % 63 %   Neutro Abs 3.0 1.7 - 7.7 K/uL   Lymphocytes Relative 17 %   Lymphs Abs 0.8 0.7 - 4.0 K/uL   Monocytes Relative 11 %   Monocytes Absolute 0.5 0.1 - 1.0 K/uL    Eosinophils Relative 9 %   Eosinophils Absolute 0.4 0.0 - 0.5 K/uL   Basophils Relative 0 %   Basophils Absolute 0.0 0.0 - 0.1 K/uL   Immature Granulocytes 0 %   Abs Immature Granulocytes 0.01 0.00 - 0.07 K/uL  Basic metabolic panel     Status: Abnormal   Collection Time: 10/23/22  9:16 AM  Result Value Ref Range   Sodium 139 135 - 145 mmol/L   Potassium 4.3 3.5 - 5.1 mmol/L   Chloride 107 98 - 111 mmol/L   CO2 25 22 - 32 mmol/L   Glucose, Bld 116 (H) 70 - 99 mg/dL   BUN 25 (H) 8 - 23 mg/dL   Creatinine, Ser 0.72 0.61 - 1.24 mg/dL   Calcium 9.2 8.9 - 10.3 mg/dL   GFR, Estimated >60 >60 mL/min   Anion gap 7 5 - 15  Aerobic/Anaerobic Culture w Gram Stain (surgical/deep wound)     Status: None (Preliminary result)   Collection Time: 10/23/22 10:15 AM   Specimen: PATH Other; Body Fluid  Result Value Ref Range   Specimen Description  FLUID AEROBIC, RIGHT KNEE Performed at [facility] [address] [place], [place] [address]    Special Requests      NONE Performed at [facility], [place] [address], [place], [place] [address]    Gram Stain      RARE WBC PRESENT, PREDOMINANTLY MONONUCLEAR NO ORGANISMS SEEN Performed at [facility], [place] [address], [place], [place] [address]    Culture PENDING    Report Status PENDING   Aerobic/Anaerobic Culture w Gram Stain (surgical/deep wound)     Status: None (Preliminary result)   Collection Time: 10/23/22 10:15 AM   Specimen: Soft Tissue, Other  Result Value Ref Range   Specimen Description      TISSUE AEROBIC RIGHT KNEE TISSUE Performed at [facility] [address] [place], [place] [address]    Special Requests      NONE Performed at [facility], [place] [address] [place], [place] [address]    Gram Stain      FEW WBC PRESENT, PREDOMINANTLY MONONUCLEAR NO ORGANISMS SEEN Performed at [facility], [place] [address], [place], [place]  [address]    Culture PENDING    Report Status PENDING   Glucose, capillary     Status: Abnormal   Collection Time: 10/23/22  1:49 PM  Result Value Ref Range   Glucose-Capillary 165 (H) 70 - 99 mg/dL  CBC     Status: Abnormal   Collection Time: 10/24/22  3:41 AM  Result Value Ref Range   WBC 8.6 4.0 - 10.5 K/uL   RBC 3.50 (L) 4.22 - 5.81 MIL/uL   Hemoglobin 11.1 (L) 13.0 - 17.0 g/dL   HCT 33.9 (L) 39.0 - 52.0 %   MCV 96.9 80.0 - 100.0 fL   MCH 31.7 26.0 - 34.0 pg   MCHC 32.7 30.0 - 36.0 g/dL   RDW 14.3 11.5 - 15.5 %   Platelets 84 (L) 150 - 400 K/uL   nRBC 0.0 0.0 - 0.2 %  Basic metabolic panel     Status: Abnormal   Collection Time: 10/24/22  3:41 AM  Result Value Ref Range   Sodium 135 135 - 145 mmol/L   Potassium 4.7 3.5 - 5.1 mmol/L   Chloride 103 98 - 111 mmol/L   CO2 27 22 - 32 mmol/L   Glucose, Bld 163 (H) 70 - 99 mg/dL   BUN 28 (H) 8 - 23 mg/dL   Creatinine, Ser 1.00 0.61 - 1.24 mg/dL   Calcium 8.0 (L) 8.9 - 10.3 mg/dL   GFR, Estimated >60 >60 mL/min   Anion gap 5 5 - 15    DG Knee Right Port  Result Date: 10/23/2022 CLINICAL DATA:  Total right knee arthroplasty. EXAM: PORTABLE RIGHT KNEE - 1-2 VIEW COMPARISON:  Right knee radiographs 08/06/2017 FINDINGS: Previously there was a medial right knee unicompartmental arthroplasty. There is now postsurgical change of total right knee arthroplasty. No perihardware lucency is seen to indicate hardware failure or loosening. Expected postoperative changes including moderate joint fluid and instruct there air. Scattered prominent anterior knee soft tissue air. No acute fracture or dislocation. IMPRESSION: Status post total right knee arthroplasty without evidence of hardware failure. Electronically Signed   By: Yvonne Kendall M.D.   On: 10/23/2022 14:13    Assessment/Plan: Principal Problem:   Pain due to unicompartmental arthroplasty of knee (HCC) Active Problems:   S/P TKR (total knee replacement), right   1 Day Post-Op s/p  Procedure(s): TOTAL KNEE ARTHROPLASTY WITH REVISION COMPONENTS  Weightbearing: WBAT RLE, up with therapy Insicional and dressing care: Reinforce dressings as needed VTE prophylaxis:  Home plavix + aspirin 81 mg daily Pain control: continue current regimen Follow - up plan: 2 weeks with Dr. Mardelle Matte Dispo: pending further progress with PT today, hopefully d/c home this afternoon  Contact information:   Merlene Pulling, PA-C Weekdays 8-5  After hours and holidays please check [email or web address] for group call information for [facility]  Ventura Bruns 10/24/2022, 8:02 AM

## 2022-10-24 NOTE — Progress Notes (Signed)
Physical Therapy Treatment Patient Details Name: Gordon Madden MRN: [id number] DOB: 1951-06-29 Today's Date: 10/24/2022   History of Present Illness Pt is a 71yo male presenting s/p conversion of R uni knee to R total knee on 10/23/22. PMH: BPH, CAD, CHF, DM, gout, HLD, HTN, hx of MI, OSA on CPAP, hx of stroke, R-UKA, L-TKA 2016    PT Comments    Pt progressing well with therapy, demo good R quad strength with exercises, denies increased pain. Educated pt on sets/reps and provided written/illustrated HEP, answered all questions. Pt ambulates 100 ft with RW, step-to pattern with supv for safety, good steadiness, no knee buckling noted or LOB. Pt declines stair/curb training, reports feeling confident from morning session. Pt denies dizziness throughout sessions. Pt limited by pain with ambulation, improvement in pain with seated rest break once back in room. Pt reports having good family support at home, has DME at home, starting OPPT 12/5 and is ready to d/c home with spouse to assist as needed.    Recommendations for follow up therapy are one component of a multi-disciplinary discharge planning process, led by the attending physician.  Recommendations may be updated based on patient status, additional functional criteria and insurance authorization.  Follow Up Recommendations  Follow physician's recommendations for discharge plan and follow up therapies     Assistance Recommended at Discharge Frequent or constant Supervision/Assistance  Patient can return home with the following Help with stairs or ramp for entrance;Assist for transportation;Assistance with cooking/housework;A little help with bathing/dressing/bathroom;A little help with walking and/or transfers   Equipment Recommendations  None recommended by PT    Recommendations for Other Services       Precautions / Restrictions Precautions Precautions: Fall;Knee Restrictions Weight Bearing Restrictions: No RLE Weight Bearing:  Weight bearing as tolerated     Mobility  Bed Mobility Overal bed mobility: Needs Assistance Bed Mobility: Sit to Supine  Sit to supine: Supervision  General bed mobility comments: pt using gait belt to self assist RLE back into bed    Transfers Overall transfer level: Needs assistance Equipment used: Rolling walker (2 wheels) Transfers: Sit to/from Stand Sit to Stand: Supervision  General transfer comment: BUE assisting to power up, good steadiness    Ambulation/Gait Ambulation/Gait assistance: Supervision Gait Distance (Feet): 100 Feet Assistive device: Rolling walker (2 wheels) Gait Pattern/deviations: Step-to pattern, Decreased stride length, Decreased stance time - right, Decreased weight shift to right Gait velocity: decreased  General Gait Details: step-to pattern, BUE assisting to decrased RLE weiht-bearing, no KI and no knee buckling noted, good quad strength noted in stance   Stairs    Wheelchair Mobility    Modified Rankin (Stroke Patients Only)       Balance Overall balance assessment: Needs assistance Sitting-balance support: No upper extremity supported, Feet supported Sitting balance-Leahy Scale: Good  Standing balance support: Reliant on assistive device for balance, During functional activity, Bilateral upper extremity supported Standing balance-Leahy Scale: Poor       Cognition Arousal/Alertness: Awake/alert Behavior During Therapy: WFL for tasks assessed/performed Overall Cognitive Status: Within Functional Limits for tasks assessed     Exercises Total Joint Exercises Ankle Circles/Pumps: Seated, AROM, Both, 10 reps Quad Sets: Seated, AROM, Strengthening, Both, 10 reps Heel Slides: Seated, AROM, Strengthening, Right, 10 reps Hip ABduction/ADduction: Seated, AROM, Strengthening, Both, 10 reps Straight Leg Raises: Seated, AROM, Strengthening, Right, 10 reps Long Arc Quad: Seated, AROM, Strengthening, Right, 10 reps    General Comments         Pertinent  Vitals/Pain Pain Assessment Pain Assessment: 0-10 Pain Score: 8  Pain Location: right knee Pain Descriptors / Indicators: Sore, Tender Pain Intervention(s): Limited activity within patient's tolerance, Monitored during session, Premedicated before session, Repositioned, Ice applied    Home Living                          Prior Function            PT Goals (current goals can now be found in the care plan section) Acute Rehab PT Goals Patient Stated Goal: Walk briskly without any pain PT Goal Formulation: With patient Time For Goal Achievement: 10/30/22 Potential to Achieve Goals: Good Progress towards PT goals: Progressing toward goals    Frequency    7X/week      PT Plan Current plan remains appropriate    Co-evaluation              AM-PAC PT "6 Clicks" Mobility   Outcome Measure  Help needed turning from your back to your side while in a flat bed without using bedrails?: None Help needed moving from lying on your back to sitting on the side of a flat bed without using bedrails?: A Little Help needed moving to and from a bed to a chair (including a wheelchair)?: A Little Help needed standing up from a chair using your arms (e.g., wheelchair or bedside chair)?: A Little Help needed to walk in hospital room?: A Little Help needed climbing 3-5 steps with a railing? : A Little 6 Click Score: 19    End of Session Equipment Utilized During Treatment: Gait belt Activity Tolerance: Patient tolerated treatment well Patient left: in bed;with call bell/phone within reach Nurse Communication: Mobility status PT Visit Diagnosis: Pain;Difficulty in walking, not elsewhere classified (R26.2) Pain - Right/Left: Right Pain - part of body: Knee     Time: 9323-5573 PT Time Calculation (min) (ACUTE ONLY): 33 min  Charges:  $Gait Training: 8-22 mins $Therapeutic Exercise: 8-22 mins                     Tori Reichen Hutzler PT, DPT 10/24/22, 2:37  PM

## 2022-10-24 NOTE — Progress Notes (Signed)
AVS reviewed. Pt voiced understanding. Awiaitng wife for transport home.

## 2022-10-24 NOTE — Discharge Instructions (Signed)

## 2022-10-24 NOTE — Plan of Care (Signed)
  Problem: Coping: Goal: Level of anxiety will decrease Outcome: Progressing   Problem: Pain Managment: Goal: General experience of comfort will improve Outcome: Progressing   

## 2022-10-24 NOTE — TOC Transition Note (Signed)
Transition of Care [facility]) - CM/SW Discharge Note   Patient Details  Name: Gordon Madden MRN: [id number] Date of Birth: 1951-10-17  Transition of Care [facility]) CM/SW Contact:  Lennart Pall, LCSW Phone Number: 10/24/2022, 10:47 AM   Clinical Narrative:    Met with pt and confirming he has needed DME at home.  OPPT arranged with [facility].  No TOC needs.   Final next level of care: OP Rehab Barriers to Discharge: No Barriers Identified   Patient Goals and CMS Choice Patient states their goals for this hospitalization and ongoing recovery are:: return home      Discharge Placement                       Discharge Plan and Services                DME Arranged: N/A DME Agency: NA                  Social Determinants of Health (SDOH) Interventions     Readmission Risk Interventions    10/24/2022   10:47 AM  Readmission Risk Prevention Plan  Post Dischage Appt Complete  Medication Screening Complete  Transportation Screening Complete

## 2022-10-25 NOTE — Discharge Summary (Addendum)
Discharge Summary  Patient ID: KYL GIVLER MRN: [id number] DOB/AGE: Jun 01, 1951 71 y.o.  Admit date: 10/23/2022 Discharge date: 11829/23  Admission Diagnoses:  Failed unicompartmental knee replacement  Discharge Diagnoses:  Principal Problem:   Pain due to unicompartmental arthroplasty of knee (HCC) Active Problems:   S/P TKR (total knee replacement), right   Past Medical History:  Diagnosis Date   Arthritis    Ascending aorta dilatation (HCC)    BPH (benign prostatic hyperplasia)    CAD (coronary artery disease)    a. MI 2004 with occ RCA.   Cardiomyopathy    EF 32%   Chronic systolic CHF (congestive heart failure) (HCC)    Colon polyps    CPAP (continuous positive airway pressure) dependence    Diabetes mellitus, type 2 (HCC)    Heart murmur    History of gout    History of kidney stones    Hyperlipidemia    Hypertension    Idiopathic thrombocytopenia (HCC)    Morbid obesity (HCC)    Myocardial infarction (HCC)    OSA (obstructive sleep apnea)    07/25/10 AHI 14.9/hr   Pneumonia    PONV (postoperative nausea and vomiting)    Primary localized osteoarthritis of left knee 06/07/2015   Primary localized osteoarthritis of right knee 08/06/2017   Rhinosinusitis    Shortness of breath dyspnea    Stroke [facility])    right amaurosis fugax, concern for retinal artery occlusion 12/2009; cerebral arteriography 01/30/10: no occlusion, stenoses, dissections, aneurysms, AVM noted, patent opthalmic arteries   Vision decreased     Surgeries: Procedure(s): TOTAL KNEE ARTHROPLASTY WITH REVISION COMPONENTS on 10/23/2022   Consultants (if any):   Discharged Condition: Improved  Hospital Course: Gordon Madden is an 71 y.o. male who was admitted 10/23/2022 with a diagnosis of Pain due to unicompartmental arthroplasty of knee ([place]) and went to the operating room on 10/23/2022 and underwent the above named procedures.    He was given perioperative antibiotics:  Anti-infectives (From  admission, onward)    Start     Dose/Rate Route Frequency Ordered Stop   10/23/22 1600  ceFAZolin (ANCEF) IVPB 2g/100 mL premix        2 g 200 mL/hr over 30 Minutes Intravenous Every 6 hours 10/23/22 1434 10/23/22 2117   10/23/22 0830  ceFAZolin (ANCEF) IVPB 3g/100 mL premix        3 g 200 mL/hr over 30 Minutes Intravenous On call to O.R. 10/23/22 0820 10/23/22 1006     .  He was given sequential compression devices, early ambulation, and asa/plavix for DVT prophylaxis.  He benefited maximally from the hospital stay and there were no complications.    Recent vital signs:  Vitals:   10/24/22 0954 10/24/22 1359  BP: 98/68 92/64  Pulse: 75 94  Resp: 16 14  Temp: 98.7 F (37.1 C) 98.1 F (36.7 C)  SpO2: 98% 97%    Recent laboratory studies:  Lab Results  Component Value Date   HGB 11.1 (L) 10/24/2022   HGB 14.3 10/23/2022   HGB 15.5 05/28/2022   Lab Results  Component Value Date   WBC 8.6 10/24/2022   PLT 84 (L) 10/24/2022   Lab Results  Component Value Date   INR 1.1 05/28/2022   Lab Results  Component Value Date   NA 135 10/24/2022   K 4.7 10/24/2022   CL 103 10/24/2022   CO2 27 10/24/2022   BUN 28 (H) 10/24/2022   CREATININE 1.00 10/24/2022   GLUCOSE  163 (H) 10/24/2022    Discharge Medications:   Allergies as of 10/24/2022       Reactions   Plavix [clopidogrel] Other (See Comments)   Thrombocytopenia-plavix causes low platelets but pt's MD monitors pt closely, and pt does take plavix.         Medication List     STOP taking these medications    ibuprofen 200 MG tablet Commonly known as: ADVIL       TAKE these medications    aspirin EC 81 MG tablet Take 1 tablet (81 mg total) by mouth daily. For DVT Prophylaxis.   carvedilol 6.25 MG tablet Commonly known as: COREG TAKE 1 TABLET TWICE DAILY   clopidogrel 75 MG tablet Commonly known as: PLAVIX Take 75 mg by mouth daily.   COQ-10 PO Take 300 mg by mouth daily.   cyanocobalamin  1000 MCG tablet Commonly known as: VITAMIN B12 Take 1,000 mcg by mouth daily.   Entresto 97-103 MG Generic drug: sacubitril-valsartan TAKE 1 TABLET TWICE DAILY. KEEP UPCOMING APPT IN DECEMBER 2022 WITH DR. VARANASI BEFORE ANYMORE REFILLS.   fexofenadine 180 MG tablet Commonly known as: ALLEGRA Take 180 mg by mouth daily.   finasteride 5 MG tablet Commonly known as: PROSCAR Take 5 mg by mouth daily.   folic acid 465 MCG tablet Commonly known as: FOLVITE Take 400 mcg by mouth daily.   metFORMIN 500 MG tablet Commonly known as: GLUCOPHAGE Take 500 mg by mouth daily with breakfast.   ondansetron 4 MG tablet Commonly known as: Zofran Take 1 tablet (4 mg total) by mouth every 8 (eight) hours as needed for nausea or vomiting.   oxyCODONE 5 MG immediate release tablet Commonly known as: Roxicodone Take 1 tablet (5 mg total) by mouth every 4 (four) hours as needed for severe pain.   rosuvastatin 20 MG tablet Commonly known as: CRESTOR TAKE ONE TABLET BY MOUTH DAILY *STOP PRAVASTATIN*   sennosides-docusate sodium 8.6-50 MG tablet Commonly known as: SENOKOT-S Take 2 tablets by mouth daily.   silodosin 8 MG Caps capsule Commonly known as: RAPAFLO Take 8 mg by mouth daily.        Diagnostic Studies: DG Knee Right Port  Result Date: 10/23/2022 CLINICAL DATA:  Total right knee arthroplasty. EXAM: PORTABLE RIGHT KNEE - 1-2 VIEW COMPARISON:  Right knee radiographs 08/06/2017 FINDINGS: Previously there was a medial right knee unicompartmental arthroplasty. There is now postsurgical change of total right knee arthroplasty. No perihardware lucency is seen to indicate hardware failure or loosening. Expected postoperative changes including moderate joint fluid and instruct there air. Scattered prominent anterior knee soft tissue air. No acute fracture or dislocation. IMPRESSION: Status post total right knee arthroplasty without evidence of hardware failure. Electronically Signed   By:  Yvonne Kendall M.D.   On: 10/23/2022 14:13    Disposition: Discharge disposition: 01-Home or Self Care          Follow-up Information     Gordon Bond, MD. Schedule an appointment as soon as possible for a visit in 2 week(s).   Specialty: Orthopedic Surgery Contact information: [address] [place] [address] [phone]                  Signed: Jola Baptist 10/25/2022, 4:43 PM

## 2022-10-28 LAB — AEROBIC/ANAEROBIC CULTURE W GRAM STAIN (SURGICAL/DEEP WOUND)
Culture: NO GROWTH
Culture: NO GROWTH

## 2022-10-30 DIAGNOSIS — R262 Difficulty in walking, not elsewhere classified: Secondary | ICD-10-CM | POA: Diagnosis not present

## 2022-10-30 DIAGNOSIS — M6281 Muscle weakness (generalized): Secondary | ICD-10-CM | POA: Diagnosis not present

## 2022-10-30 DIAGNOSIS — M25661 Stiffness of right knee, not elsewhere classified: Secondary | ICD-10-CM | POA: Diagnosis not present

## 2022-10-30 DIAGNOSIS — M1711 Unilateral primary osteoarthritis, right knee: Secondary | ICD-10-CM | POA: Diagnosis not present

## 2022-11-01 DIAGNOSIS — M25661 Stiffness of right knee, not elsewhere classified: Secondary | ICD-10-CM | POA: Diagnosis not present

## 2022-11-01 DIAGNOSIS — M6281 Muscle weakness (generalized): Secondary | ICD-10-CM | POA: Diagnosis not present

## 2022-11-01 DIAGNOSIS — R262 Difficulty in walking, not elsewhere classified: Secondary | ICD-10-CM | POA: Diagnosis not present

## 2022-11-01 DIAGNOSIS — M1711 Unilateral primary osteoarthritis, right knee: Secondary | ICD-10-CM | POA: Diagnosis not present

## 2022-11-05 DIAGNOSIS — M25561 Pain in right knee: Secondary | ICD-10-CM | POA: Diagnosis not present

## 2022-11-06 DIAGNOSIS — M25661 Stiffness of right knee, not elsewhere classified: Secondary | ICD-10-CM | POA: Diagnosis not present

## 2022-11-06 DIAGNOSIS — R262 Difficulty in walking, not elsewhere classified: Secondary | ICD-10-CM | POA: Diagnosis not present

## 2022-11-06 DIAGNOSIS — M6281 Muscle weakness (generalized): Secondary | ICD-10-CM | POA: Diagnosis not present

## 2022-11-06 DIAGNOSIS — M1711 Unilateral primary osteoarthritis, right knee: Secondary | ICD-10-CM | POA: Diagnosis not present

## 2022-11-08 DIAGNOSIS — M1711 Unilateral primary osteoarthritis, right knee: Secondary | ICD-10-CM | POA: Diagnosis not present

## 2022-11-08 DIAGNOSIS — M25661 Stiffness of right knee, not elsewhere classified: Secondary | ICD-10-CM | POA: Diagnosis not present

## 2022-11-08 DIAGNOSIS — M6281 Muscle weakness (generalized): Secondary | ICD-10-CM | POA: Diagnosis not present

## 2022-11-08 DIAGNOSIS — R262 Difficulty in walking, not elsewhere classified: Secondary | ICD-10-CM | POA: Diagnosis not present

## 2022-11-13 DIAGNOSIS — M25661 Stiffness of right knee, not elsewhere classified: Secondary | ICD-10-CM | POA: Diagnosis not present

## 2022-11-13 DIAGNOSIS — R262 Difficulty in walking, not elsewhere classified: Secondary | ICD-10-CM | POA: Diagnosis not present

## 2022-11-13 DIAGNOSIS — M6281 Muscle weakness (generalized): Secondary | ICD-10-CM | POA: Diagnosis not present

## 2022-11-13 DIAGNOSIS — M1711 Unilateral primary osteoarthritis, right knee: Secondary | ICD-10-CM | POA: Diagnosis not present

## 2022-11-15 DIAGNOSIS — M1711 Unilateral primary osteoarthritis, right knee: Secondary | ICD-10-CM | POA: Diagnosis not present

## 2022-11-15 DIAGNOSIS — M6281 Muscle weakness (generalized): Secondary | ICD-10-CM | POA: Diagnosis not present

## 2022-11-15 DIAGNOSIS — M25661 Stiffness of right knee, not elsewhere classified: Secondary | ICD-10-CM | POA: Diagnosis not present

## 2022-11-15 DIAGNOSIS — R262 Difficulty in walking, not elsewhere classified: Secondary | ICD-10-CM | POA: Diagnosis not present

## 2022-11-20 DIAGNOSIS — R262 Difficulty in walking, not elsewhere classified: Secondary | ICD-10-CM | POA: Diagnosis not present

## 2022-11-20 DIAGNOSIS — M25661 Stiffness of right knee, not elsewhere classified: Secondary | ICD-10-CM | POA: Diagnosis not present

## 2022-11-20 DIAGNOSIS — M6281 Muscle weakness (generalized): Secondary | ICD-10-CM | POA: Diagnosis not present

## 2022-11-20 DIAGNOSIS — M1711 Unilateral primary osteoarthritis, right knee: Secondary | ICD-10-CM | POA: Diagnosis not present

## 2022-11-22 DIAGNOSIS — R262 Difficulty in walking, not elsewhere classified: Secondary | ICD-10-CM | POA: Diagnosis not present

## 2022-11-22 DIAGNOSIS — M25661 Stiffness of right knee, not elsewhere classified: Secondary | ICD-10-CM | POA: Diagnosis not present

## 2022-11-22 DIAGNOSIS — M1711 Unilateral primary osteoarthritis, right knee: Secondary | ICD-10-CM | POA: Diagnosis not present

## 2022-11-22 DIAGNOSIS — M6281 Muscle weakness (generalized): Secondary | ICD-10-CM | POA: Diagnosis not present

## 2022-11-26 ENCOUNTER — Other Ambulatory Visit: Payer: Self-pay | Admitting: Interventional Cardiology

## 2022-11-27 DIAGNOSIS — R262 Difficulty in walking, not elsewhere classified: Secondary | ICD-10-CM | POA: Diagnosis not present

## 2022-11-27 DIAGNOSIS — M6281 Muscle weakness (generalized): Secondary | ICD-10-CM | POA: Diagnosis not present

## 2022-11-27 DIAGNOSIS — M1711 Unilateral primary osteoarthritis, right knee: Secondary | ICD-10-CM | POA: Diagnosis not present

## 2022-11-27 DIAGNOSIS — M25661 Stiffness of right knee, not elsewhere classified: Secondary | ICD-10-CM | POA: Diagnosis not present

## 2022-11-28 DIAGNOSIS — N200 Calculus of kidney: Secondary | ICD-10-CM | POA: Diagnosis not present

## 2022-11-28 DIAGNOSIS — R350 Frequency of micturition: Secondary | ICD-10-CM | POA: Diagnosis not present

## 2022-11-28 DIAGNOSIS — N401 Enlarged prostate with lower urinary tract symptoms: Secondary | ICD-10-CM | POA: Diagnosis not present

## 2022-11-28 DIAGNOSIS — N281 Cyst of kidney, acquired: Secondary | ICD-10-CM | POA: Diagnosis not present

## 2022-11-29 DIAGNOSIS — M25661 Stiffness of right knee, not elsewhere classified: Secondary | ICD-10-CM | POA: Diagnosis not present

## 2022-11-29 DIAGNOSIS — M1711 Unilateral primary osteoarthritis, right knee: Secondary | ICD-10-CM | POA: Diagnosis not present

## 2022-11-29 DIAGNOSIS — M6281 Muscle weakness (generalized): Secondary | ICD-10-CM | POA: Diagnosis not present

## 2022-11-29 DIAGNOSIS — R262 Difficulty in walking, not elsewhere classified: Secondary | ICD-10-CM | POA: Diagnosis not present

## 2023-01-07 DIAGNOSIS — M19011 Primary osteoarthritis, right shoulder: Secondary | ICD-10-CM | POA: Diagnosis not present

## 2023-01-07 DIAGNOSIS — M1711 Unilateral primary osteoarthritis, right knee: Secondary | ICD-10-CM | POA: Diagnosis not present

## 2023-01-15 NOTE — Progress Notes (Unsigned)
Cardiology Office Note   Date:  01/17/2023   ID:  Gordon Madden, DOB 1951-09-23, MRN [id number]  PCP:  Mckinley Jewel, MD    No chief complaint on file.  CAD  Wt Readings from Last 3 Encounters:  01/17/23 261 lb (118.4 kg)  10/23/22 282 lb (127.9 kg)  10/10/22 282 lb (127.9 kg)       History of Present Illness: Gordon Madden is a 72 y.o. male  with history of CAD, Status post MI in 2004 and found to have a totally occluded RCA at [facility] in [place]. Last stress Myoview 05/2014 low risk with large inferior lateral scar and minimal peri-infarct ischemia EF 35% with inferolateral akinesis/mild dyskinesis. Felt to be low risk. 2-D echo 05/2014 EF 40-40%. chronic systolic CHF with EF [id number], diabetes mellitus, CVA, gout, HTN, HLD, OSA, BPH, morbid obesity, chronic DOE, chronic thrombocytopenia, arthritis, mildly dilated aorta.   Patient saw Gordon Madden/PA-C 07/10/17 for surgical clearance before undergoing right knee surgery. He was having worsening shortness of breath. 2-D echo shows LVEF 35-40% with global hypokinesis and grade 1 DD-worse than before. Nuclear stress test 07/18/17 LVEF 36% with fixed large severe basal inferior and basal to mid inferior lateral defect but no ischemia.  I reviewed and felt he could proceed with surgery and to avoid excessive IV fluids. 6/18 BNP was normal.   He had right knee surgery and tolerated it well, in 9/18.    Nosebleed in 2/19 led to aspirin being stopped.  He continued to take Plavix.   He had some procedures on ingrown toe nails.  It oozed for several months, but resolved.  Dr. [name] did this procedure.    In 2021, he lost weight.  He hired a Transport planner and this helped.  Decreased portion size.  He is exercising with the [facility].     Had some scalp tenderness in 2021.  Carotid Doppler was considered.  It did eventually resolve.  He thinks it may have been shingles.   He held plavix for nosebleeds in  2023.   Successful right TKR in 11/23.  50 lb weight loss since 2023.  Walks daily.    Denies : Chest pain. Dizziness. Leg edema. Nitroglycerin use. Orthopnea. Palpitations. Paroxysmal nocturnal dyspnea.  Syncope.    Stable DOE with walking up stairs.   Past Medical History:  Diagnosis Date   Arthritis    Ascending aorta dilatation (HCC)    BPH (benign prostatic hyperplasia)    CAD (coronary artery disease)    a. MI 2004 with occ RCA.   Cardiomyopathy    EF [id number]   Chronic systolic CHF (congestive heart failure) (HCC)    Colon polyps    CPAP (continuous positive airway pressure) dependence    Diabetes mellitus, type 2 (HCC)    Heart murmur    History of gout    History of kidney stones    Hyperlipidemia    Hypertension    Idiopathic thrombocytopenia (HCC)    Morbid obesity (HCC)    Myocardial infarction (HCC)    OSA (obstructive sleep apnea)    07/25/10 AHI 14.9/hr   Pneumonia    PONV (postoperative nausea and vomiting)    Primary localized osteoarthritis of left knee 06/07/2015   Primary localized osteoarthritis of right knee 08/06/2017   Rhinosinusitis    Shortness of breath dyspnea    Stroke [facility])    right amaurosis fugax, concern for retinal artery occlusion 12/2009;  cerebral arteriography 01/30/10: no occlusion, stenoses, dissections, aneurysms, AVM noted, patent opthalmic arteries   Vision decreased     Past Surgical History:  Procedure Laterality Date   CARDIAC CATHETERIZATION  2005   no stents   CHOLECYSTECTOMY N/A 02/02/2021   Procedure: LAPAROSCOPIC CHOLECYSTECTOMY;  Surgeon: [name], MD;  Location: [place];  Service: General;  Laterality: N/A;   CYST EXCISION     chest and back   HERNIA REPAIR     umbilical   JOINT REPLACEMENT     Left 06/07/15   KNEE CARTILAGE SURGERY     left   KNEE SURGERY Right 08/06/2017   MENISCUS REPAIR Left    NASAL SINUS SURGERY     PARTIAL KNEE ARTHROPLASTY Right 08/06/2017   Procedure: UNICOMPARTMENTAL KNEE;  Surgeon:  [name], MD;  Location: [place] [facility];  Service: Orthopedics;  Laterality: Right;   SINUS IRRIGATION     TOTAL KNEE ARTHROPLASTY Left 06/07/2015   Procedure: TOTAL KNEE ARTHROPLASTY;  Surgeon: [name], MD;  Location: [place];  Service: Orthopedics;  Laterality: Left;   TOTAL KNEE ARTHROPLASTY WITH REVISION COMPONENTS Right 10/23/2022   Procedure: TOTAL KNEE ARTHROPLASTY WITH REVISION COMPONENTS;  Surgeon: [name], MD;  Location: [facility];  Service: Orthopedics;  Laterality: Right;   UMBILICAL HERNIA REPAIR       Current Outpatient Medications  Medication Sig Dispense Refill   carvedilol (COREG) 6.25 MG tablet TAKE 1 TABLET TWICE DAILY 120 tablet 0   clopidogrel (PLAVIX) 75 MG tablet Take 75 mg by mouth daily.     Coenzyme Q10 (COQ-10 PO) Take 300 mg by mouth daily.     ENTRESTO 97-103 MG TAKE 1 TABLET TWICE DAILY. KEEP UPCOMING APPT IN DECEMBER 2022 WITH DR. [name] BEFORE ANYMORE REFILLS. 180 tablet 3   fexofenadine (ALLEGRA) 180 MG tablet Take 180 mg by mouth daily.     finasteride (PROSCAR) 5 MG tablet Take 5 mg by mouth daily.     folic acid (FOLVITE) A999333 MCG tablet Take 400 mcg by mouth daily.     metFORMIN (GLUCOPHAGE) 500 MG tablet Take 500 mg by mouth daily with breakfast.     rosuvastatin (CRESTOR) 20 MG tablet TAKE ONE TABLET BY MOUTH DAILY *STOP PRAVASTATIN* 90 tablet 3   silodosin (RAPAFLO) 8 MG CAPS capsule Take 8 mg by mouth daily.     vitamin B-12 (CYANOCOBALAMIN) 1000 MCG tablet Take 1,000 mcg by mouth daily.     ondansetron (ZOFRAN) 4 MG tablet Take 1 tablet (4 mg total) by mouth every 8 (eight) hours as needed for nausea or vomiting. 10 tablet 0   sennosides-docusate sodium (SENOKOT-S) 8.6-50 MG tablet Take 2 tablets by mouth daily. 30 tablet 1   No current facility-administered medications for this visit.    Allergies:   Plavix [clopidogrel]    Social History:  The patient  reports that he has been smoking cigars and cigarettes. He has a 60.00 pack-year  smoking history. He has never used smokeless tobacco. He reports current alcohol use of about 1.0 standard drink of alcohol per week. He reports that he does not use drugs.   Family History:  The patient's family history includes Hypertension in his father.    ROS:  Please see the history of present illness.   Otherwise, review of systems are positive for intentional weight loss.   All other systems are reviewed and negative.    PHYSICAL EXAM: VS:  BP 100/78   Pulse 61   Ht 5'  11" (1.803 m)   Wt 261 lb (118.4 kg)   SpO2 95%   BMI 36.40 kg/m  , BMI Body mass index is 36.4 kg/m. GEN: Well nourished, well developed, in no acute distress HEENT: normal Neck: no JVD, carotid bruits, or masses Cardiac: RRR; 3/6 systolic murmur, no rubs, or gallops,no edema  Respiratory:  clear to auscultation bilaterally, normal work of breathing GI: soft, nontender, nondistended, + BS MS: no deformity or atrophy Skin: warm and dry, no rash Neuro:  Strength and sensation are intact Psych: euthymic mood, full affect   EKG:   The ekg ordered today demonstrates NSR, prolonged PR interval, poor R wave progression   Recent Labs: 02/07/2022: ALT 12 10/24/2022: BUN 28; Creatinine, Ser 1.00; Hemoglobin 11.1; Platelets 84; Potassium 4.7; Sodium 135   Lipid Panel    Component Value Date/Time   CHOL 104 02/07/2022 0940   TRIG 123 02/07/2022 0940   HDL 44 02/07/2022 0940   CHOLHDL 2.4 02/07/2022 0940   LDLCALC 38 02/07/2022 0940     Other studies Reviewed: Additional studies/ records that were reviewed today with results demonstrating: Labs reviewed.   ASSESSMENT AND PLAN:  CAD: No angina.  Known CAD from heart cath done in [place] many years ago.  Continue aggressive secondary prevention.  Continue clopidogrel monotherapy.  No bleeding problems.  Hemoglobin 11.1 in November 2023.  No aspirin.  Follows with primary care doctor. Chronic systolic heart failure: Check an echocardiogram to see if  LVEF has improved.  Hopefully it is better with the [facility]. HTN: Watch for symptoms of low blood pressure given the dramatic weight loss.  If his blood pressure gets too low, may need to cut back on either carvedilol or Entresto dose. Tobacco abuse: occasional cigar use in the past.  Mitral regurgitation: murmur noted on exam.  No CHF sx.  Check echo.  Hyperlipidemia: LDL 42 and 2023.  Continue rosuvastatin. DM: A1C down to 6.0.  Metformin dose has been decreased. Hoping to come off of metformin completely Morbid obesity: Goal is 210 pounds.  Lost 50 lbs. doing very well with weight loss through weight watchers.  He is much more careful about his diet now.  Walking is increased significantly since his knee replacement as well.   Current medicines are reviewed at length with the patient today.  The patient concerns regarding his medicines were addressed.  The following changes have been made:  No change  Labs/ tests ordered today include:  No orders of the defined types were placed in this encounter.   Recommend 150 minutes/week of aerobic exercise Low fat, low carb, high fiber diet recommended  Disposition:   FU in 1 year   Signed, Larae Grooms, MD  01/17/2023 8:31 AM    [facility] [address], [place], [place]  [address] Phone: [phone]; Fax: [phone]

## 2023-01-17 ENCOUNTER — Ambulatory Visit: Payer: Medicare HMO | Attending: Interventional Cardiology | Admitting: Interventional Cardiology

## 2023-01-17 ENCOUNTER — Encounter: Payer: Self-pay | Admitting: Interventional Cardiology

## 2023-01-17 VITALS — BP 100/78 | HR 61 | Ht 71.0 in | Wt 261.0 lb

## 2023-01-17 DIAGNOSIS — I340 Nonrheumatic mitral (valve) insufficiency: Secondary | ICD-10-CM | POA: Diagnosis not present

## 2023-01-17 DIAGNOSIS — E1159 Type 2 diabetes mellitus with other circulatory complications: Secondary | ICD-10-CM

## 2023-01-17 DIAGNOSIS — E782 Mixed hyperlipidemia: Secondary | ICD-10-CM

## 2023-01-17 DIAGNOSIS — I25118 Atherosclerotic heart disease of native coronary artery with other forms of angina pectoris: Secondary | ICD-10-CM | POA: Diagnosis not present

## 2023-01-17 DIAGNOSIS — I5022 Chronic systolic (congestive) heart failure: Secondary | ICD-10-CM

## 2023-01-17 NOTE — Patient Instructions (Signed)
Medication Instructions:  Your physician recommends that you continue on your current medications as directed. Please refer to the Current Medication list given to you today.  *If you need a refill on your cardiac medications before your next appointment, please call your pharmacy*  Testing/Procedures: Your physician has requested that you have an echocardiogram. Echocardiography is a painless test that uses sound waves to create images of your heart. It provides your doctor with information about the size and shape of your heart and how well your heart's chambers and valves are working. This procedure takes approximately one hour. There are no restrictions for this procedure. Please do NOT wear cologne, perfume, aftershave, or lotions (deodorant is allowed). Please arrive 15 minutes prior to your appointment time.  Follow-Up: At [facility], you and your health needs are our priority.  As part of our continuing mission to provide you with exceptional heart care, we have created designated Provider Care Teams.  These Care Teams include your primary Cardiologist (physician) and Advanced Practice Providers (APPs -  Physician Assistants and Nurse Practitioners) who all work together to provide you with the care you need, when you need it.  Your next appointment:   1 year(s)  Provider:   Larae Grooms, MD

## 2023-01-23 ENCOUNTER — Other Ambulatory Visit: Payer: Self-pay | Admitting: Interventional Cardiology

## 2023-01-24 DIAGNOSIS — L02211 Cutaneous abscess of abdominal wall: Secondary | ICD-10-CM | POA: Diagnosis not present

## 2023-02-06 ENCOUNTER — Other Ambulatory Visit: Payer: Self-pay | Admitting: Interventional Cardiology

## 2023-02-18 ENCOUNTER — Ambulatory Visit (HOSPITAL_COMMUNITY): Payer: Medicare HMO | Attending: Interventional Cardiology

## 2023-02-18 DIAGNOSIS — I340 Nonrheumatic mitral (valve) insufficiency: Secondary | ICD-10-CM

## 2023-02-18 LAB — ECHOCARDIOGRAM COMPLETE
AR max vel: 1.71 cm2
AV Area VTI: 1.73 cm2
AV Area mean vel: 1.72 cm2
AV Mean grad: 11.8 mmHg
AV Peak grad: 21.7 mmHg
Ao pk vel: 2.33 m/s
Area-P 1/2: 2.33 cm2
MV M vel: 5.19 m/s
MV Peak grad: 107.7 mmHg
P 1/2 time: 760 msec
S' Lateral: 4.3 cm

## 2023-04-04 DIAGNOSIS — I5022 Chronic systolic (congestive) heart failure: Secondary | ICD-10-CM | POA: Diagnosis not present

## 2023-04-04 DIAGNOSIS — E1121 Type 2 diabetes mellitus with diabetic nephropathy: Secondary | ICD-10-CM | POA: Diagnosis not present

## 2023-04-04 DIAGNOSIS — I2510 Atherosclerotic heart disease of native coronary artery without angina pectoris: Secondary | ICD-10-CM | POA: Diagnosis not present

## 2023-04-04 DIAGNOSIS — G4733 Obstructive sleep apnea (adult) (pediatric): Secondary | ICD-10-CM | POA: Diagnosis not present

## 2023-04-04 DIAGNOSIS — D696 Thrombocytopenia, unspecified: Secondary | ICD-10-CM | POA: Diagnosis not present

## 2023-04-04 DIAGNOSIS — I110 Hypertensive heart disease with heart failure: Secondary | ICD-10-CM | POA: Diagnosis not present

## 2023-04-04 DIAGNOSIS — I429 Cardiomyopathy, unspecified: Secondary | ICD-10-CM | POA: Diagnosis not present

## 2023-04-04 DIAGNOSIS — I119 Hypertensive heart disease without heart failure: Secondary | ICD-10-CM | POA: Diagnosis not present

## 2023-04-04 DIAGNOSIS — I700 Atherosclerosis of aorta: Secondary | ICD-10-CM | POA: Diagnosis not present

## 2023-04-08 ENCOUNTER — Telehealth: Payer: Self-pay | Admitting: Interventional Cardiology

## 2023-04-08 NOTE — Telephone Encounter (Signed)
Patient notified.  He will stop Plavix now and call back in 2-3 weeks with an update on bleeding.

## 2023-04-08 NOTE — Telephone Encounter (Signed)
Pt c/o medication issue:  1. Name of Medication:   clopidogrel (PLAVIX) 75 MG tablet    2. How are you currently taking this medication (dosage and times per day)? As written   3. Are you having a reaction (difficulty breathing--STAT)? no  4. What is your medication issue? Pt states he has been bleeding a lot since he cut himself while trimming bushes and he thinks he needs to stop this med. Please advise.

## 2023-04-08 NOTE — Telephone Encounter (Signed)
OK to stop plavix now as no recent stent.  Should be on some antiplatlet agent for his heart.  COuld consider baby aspirin in the future vs resuming PLavix in a few weeks.    Let us know how the bleeding goes

## 2023-04-08 NOTE — Telephone Encounter (Signed)
I spoke with patient.  He reports he was doing trimming yesterday and noticed a small puncture wound on his forearm that was bleeding.  Thinks it happened when he was using a small sawblade.  He had to put a band aid on it to stop the bleeding.  Today he took off the band aid and it started bleeding again.  Patient reports for the last month he has noticed he bleeds more often if he cuts or scratches himself.  Has chronic thrombocytopenia and platelet count has been dropping over the years.  Most recent was 62,000.   Patient is asking if OK to stop Plavix

## 2023-04-10 ENCOUNTER — Other Ambulatory Visit: Payer: Self-pay | Admitting: Medical Oncology

## 2023-04-23 ENCOUNTER — Telehealth: Payer: Self-pay

## 2023-04-23 NOTE — Telephone Encounter (Signed)
Spoke with patient regarding lung cancer screening referral.  Patient quit smoking cigarettes over 30 years ago, which would make him ineligible for LDCT.  He currently smokes cigars, which is not included in criteria for LDCT.  Patient is interested in a CT chest wo, if recommended by his referring provider.  Advised we will route note to PCP for further recommendation or other alternative imaging.  Patient would be contacted by PCP office if additional imaging ordered by them.  Referral to lung screening has been closed

## 2023-04-29 NOTE — Telephone Encounter (Signed)
I spoke with patient.  He stopped Plavix after 5/13 phone call.  He started taking aspirin 81 mg daily about a week ago.  He only bleeds when he cuts himself and since stopping Plavix/starting aspirin he has not cut himself or had any bleeding.  He is seeing hematology next week.

## 2023-04-30 MED ORDER — ASPIRIN 81 MG PO TBEC: 81.0000 mg | DELAYED_RELEASE_TABLET | Freq: Every day | ORAL | 3 refills | Status: AC

## 2023-04-30 NOTE — Telephone Encounter (Signed)
Patient notified. He will stay off Plavix and continue aspirin 81 mg daily

## 2023-04-30 NOTE — Telephone Encounter (Signed)
Continue baby aspirin 

## 2023-05-07 ENCOUNTER — Other Ambulatory Visit: Payer: Medicare HMO

## 2023-05-07 ENCOUNTER — Encounter: Payer: Medicare HMO | Admitting: Internal Medicine

## 2023-05-13 DIAGNOSIS — M1711 Unilateral primary osteoarthritis, right knee: Secondary | ICD-10-CM | POA: Diagnosis not present

## 2023-05-20 ENCOUNTER — Other Ambulatory Visit: Payer: Self-pay | Admitting: *Deleted

## 2023-05-20 DIAGNOSIS — D696 Thrombocytopenia, unspecified: Secondary | ICD-10-CM

## 2023-05-20 NOTE — Progress Notes (Unsigned)
[facility] [phone]   [phone]  CONSULT NOTE  REFERRING PHYSICIAN: Dr. Jacqulyn Bath  REASON FOR CONSULTATION:  Thrombocytopenia  HPI Gordon Madden is a 72 y.o. male with a past medical history significant for hepatic steatosis, hypertension, myocardial infarction, cardiomyopathy, stroke, obstructive sleep apnea, diabetes, chronic kidney disease, knee replacement, and hyperlipidemia  Patient recently had lab work performed at his PCPs office on 04/04/2023 which showed slightly low white blood cell count at 3.4, normal hemoglobin at 14.2, and persistent/worsening thrombocytopenia with a platelet count of 62,000.  He was subsequently referred to the clinic regarding these findings.  He is referred to the clinic for further evaluation regarding these findings.  He is not sure how long he has had thrombocytopenia. The oldest records I have are from 2011.  It looks like he has had persistent thrombocytopenia since that time.  The lowest platelet count I see is most recent lab performed at his PCPs office at 62 K.    He denies any new medications.  Denies any frequent over-the-counter medications. Denies any herbal supplement use or teas.  Denies any abnormal bleeding or bruising including epistaxis, gingival bleeding, hemoptysis, hematemesis, melena, or hematochezia***he was on aspirin and Plavix for his history of stroke and heart attack but his Plavix was stopped in May 2024 secondary to bleeding.  He is currently on 81 mg aspirin..  Denies any blood thinners.  He denies any particular dietary habits such as being a vegan or vegetarian except he does not eat a lot of red meat. He denies any history of nutritional deficiencies. Denies any personal history of autoimmune disorders.  Denies any fever, chills, night sweats, or lymphadenopathy.  He denies any recent infections or signs of recent infection. He denies history of liver disease.  Ultrasound of the abdomen  performed in December 2011 which showed evidence of hepatic steatosis.  Denies any history of hepatitis or HIV.  He has never had any type of bleeding complications from surgery in the past.    HPI  Past Medical History:  Diagnosis Date   Arthritis    Ascending aorta dilatation (HCC)    BPH (benign prostatic hyperplasia)    CAD (coronary artery disease)    a. MI 2004 with occ RCA.   Cardiomyopathy    EF 40%   Chronic systolic CHF (congestive heart failure) (HCC)    Colon polyps    CPAP (continuous positive airway pressure) dependence    Diabetes mellitus, type 2 (HCC)    Heart murmur    History of gout    History of kidney stones    Hyperlipidemia    Hypertension    Idiopathic thrombocytopenia (HCC)    Morbid obesity (HCC)    Myocardial infarction (HCC)    OSA (obstructive sleep apnea)    07/25/10 AHI 14.9/hr   Pneumonia    PONV (postoperative nausea and vomiting)    Primary localized osteoarthritis of left knee 06/07/2015   Primary localized osteoarthritis of right knee 08/06/2017   Rhinosinusitis    Shortness of breath dyspnea    Stroke [facility])    right amaurosis fugax, concern for retinal artery occlusion 12/2009; cerebral arteriography 01/30/10: no occlusion, stenoses, dissections, aneurysms, AVM noted, patent opthalmic arteries   Vision decreased     Past Surgical History:  Procedure Laterality Date   CARDIAC CATHETERIZATION  2005   no stents   CHOLECYSTECTOMY N/A 02/02/2021   Procedure: LAPAROSCOPIC CHOLECYSTECTOMY;  Surgeon: Fritzi Mandes, MD;  Location: [facility] OR;  Service: General;  Laterality: N/A;   CYST EXCISION     chest and back   HERNIA REPAIR     umbilical   JOINT REPLACEMENT     Left 06/07/15   KNEE CARTILAGE SURGERY     left   KNEE SURGERY Right 08/06/2017   MENISCUS REPAIR Left    NASAL SINUS SURGERY     PARTIAL KNEE ARTHROPLASTY Right 08/06/2017   Procedure: UNICOMPARTMENTAL KNEE;  Surgeon: Teryl Lucy, MD;  Location: [facility] OR;  Service: Orthopedics;   Laterality: Right;   SINUS IRRIGATION     TOTAL KNEE ARTHROPLASTY Left 06/07/2015   Procedure: TOTAL KNEE ARTHROPLASTY;  Surgeon: Teryl Lucy, MD;  Location: [facility] OR;  Service: Orthopedics;  Laterality: Left;   TOTAL KNEE ARTHROPLASTY WITH REVISION COMPONENTS Right 10/23/2022   Procedure: TOTAL KNEE ARTHROPLASTY WITH REVISION COMPONENTS;  Surgeon: Teryl Lucy, MD;  Location: [facility] ORS;  Service: Orthopedics;  Laterality: Right;   UMBILICAL HERNIA REPAIR      Family History  Problem Relation Age of Onset   Hypertension Father    Colon cancer Neg Hx    Heart attack Neg Hx     Social History Social History   Tobacco Use   Smoking status: Every Day    Packs/day: 3.00    Years: 20.00    Additional pack years: 0.00    Total pack years: 60.00    Types: Cigars, Cigarettes    Last attempt to quit: 11/26/2016    Years since quitting: 6.4   Smokeless tobacco: Never   Tobacco comments:    stopped smoking cigarettes in 1991, smokes 1 cigar per day  Vaping Use   Vaping Use: Never used  Substance Use Topics   Alcohol use: Yes    Alcohol/week: 1.0 standard drink of alcohol    Types: 1 Standard drinks or equivalent per week    Comment: occassionally   Drug use: No    Allergies  Allergen Reactions   Plavix [Clopidogrel] Other (See Comments)    Thrombocytopenia-plavix causes low platelets but pt's MD monitors pt closely, and pt does take plavix.     Current Outpatient Medications  Medication Sig Dispense Refill   aspirin EC 81 MG tablet Take 1 tablet (81 mg total) by mouth daily. Swallow whole. 90 tablet 3   carvedilol (COREG) 6.25 MG tablet Take 1 tablet (6.25 mg total) by mouth 2 (two) times daily with a meal. 180 tablet 3   Coenzyme Q10 (COQ-10 PO) Take 300 mg by mouth daily.     ENTRESTO 97-103 MG TAKE 1 TABLET TWICE DAILY 180 tablet 3   fexofenadine (ALLEGRA) 180 MG tablet Take 180 mg by mouth daily.     finasteride (PROSCAR) 5 MG tablet Take 5 mg by mouth daily.     folic acid  (FOLVITE) 400 MCG tablet Take 400 mcg by mouth daily.     metFORMIN (GLUCOPHAGE) 500 MG tablet Take 500 mg by mouth daily with breakfast.     rosuvastatin (CRESTOR) 20 MG tablet TAKE ONE TABLET BY MOUTH DAILY *STOP PRAVASTATIN* 90 tablet 3   silodosin (RAPAFLO) 8 MG CAPS capsule Take 8 mg by mouth daily.     vitamin B-12 (CYANOCOBALAMIN) 1000 MCG tablet Take 1,000 mcg by mouth daily.     No current facility-administered medications for this visit.    REVIEW OF SYSTEMS:   Review of Systems  Constitutional: Negative for appetite change, chills, fatigue, fever and unexpected weight change.  HENT:  Negative for mouth sores, nosebleeds, sore throat and trouble swallowing.   Eyes: Negative for eye problems and icterus.  Respiratory: Negative for cough, hemoptysis, shortness of breath and wheezing.   Cardiovascular: Negative for chest pain and leg swelling.  Gastrointestinal: Negative for abdominal pain, constipation, diarrhea, nausea and vomiting.  Genitourinary: Negative for bladder incontinence, difficulty urinating, dysuria, frequency and hematuria.   Musculoskeletal: Negative for back pain, gait problem, neck pain and neck stiffness.  Skin: Negative for itching and rash.  Neurological: Negative for dizziness, extremity weakness, gait problem, headaches, light-headedness and seizures.  Hematological: Negative for adenopathy. Does not bruise/bleed easily.  Psychiatric/Behavioral: Negative for confusion, depression and sleep disturbance. The patient is not nervous/anxious.     PHYSICAL EXAMINATION:  There were no vitals taken for this visit.  ECOG PERFORMANCE STATUS: {CHL ONC ECOG [id number]  Physical Exam  Constitutional: Oriented to person, place, and time and well-developed, well-nourished, and in no distress. No distress.  HENT:  Head: Normocephalic and atraumatic.  Mouth/Throat: Oropharynx is clear and moist. No oropharyngeal exudate.  Eyes: Conjunctivae are normal. Right eye  exhibits no discharge. Left eye exhibits no discharge. No scleral icterus.  Neck: Normal range of motion. Neck supple.  Cardiovascular: Normal rate, regular rhythm, normal heart sounds and intact distal pulses.   Pulmonary/Chest: Effort normal and breath sounds normal. No respiratory distress. No wheezes. No rales.  Abdominal: Soft. Bowel sounds are normal. Exhibits no distension and no mass. There is no tenderness.  Musculoskeletal: Normal range of motion. Exhibits no edema.  Lymphadenopathy:    No cervical adenopathy.  Neurological: Alert and oriented to person, place, and time. Exhibits normal muscle tone. Gait normal. Coordination normal.  Skin: Skin is warm and dry. No rash noted. Not diaphoretic. No erythema. No pallor.  Psychiatric: Mood, memory and judgment normal.  Vitals reviewed.  LABORATORY DATA: Lab Results  Component Value Date   WBC 8.6 10/24/2022   HGB 11.1 (L) 10/24/2022   HCT 33.9 (L) 10/24/2022   MCV 96.9 10/24/2022   PLT 84 (L) 10/24/2022      Chemistry      Component Value Date/Time   NA 135 10/24/2022 0341   NA 138 10/02/2017 1345   NA 137 05/09/2015 1111   K 4.7 10/24/2022 0341   K 4.7 05/09/2015 1111   CL 103 10/24/2022 0341   CO2 27 10/24/2022 0341   CO2 22 05/09/2015 1111   BUN 28 (H) 10/24/2022 0341   BUN 17 10/02/2017 1345   BUN 15.2 05/09/2015 1111   CREATININE 1.00 10/24/2022 0341   CREATININE 0.8 05/09/2015 1111      Component Value Date/Time   CALCIUM 8.0 (L) 10/24/2022 0341   CALCIUM 8.7 05/09/2015 1111   ALKPHOS 58 02/07/2022 0940   ALKPHOS 46 05/09/2015 1111   AST 15 02/07/2022 0940   AST 16 05/09/2015 1111   ALT 12 02/07/2022 0940   ALT 17 05/09/2015 1111   BILITOT 0.7 02/07/2022 0940   BILITOT 0.82 05/09/2015 1111       RADIOGRAPHIC STUDIES: No results found.  ASSESSMENT: This a very pleasant 72 year old Caucasian male with thrombocytopenia.   The patient was seen with Dr. Arbutus Ped today.  The patient several lab  studies performed today including a CBC, CMP, LDH, vitamin B12, folate, hepatitis panel, HIV testing, rheumatoid factor, and ANA.  His white blood cell count is normal. His hemoglobin has remained normal and his platelet count low at *** K.  His CMP is unremarkable.  His LDH is  normal.  His other lab studies are pending at this time.    Dr. Arbutus Ped discussed this could be related to his liver disease.  Treatment is required unless the patient's platelet count is less than ***signs and symptoms of bleeding in which case Dr. Arbutus Ped recommended  ***.  Question has diabetes?    ***Thinner holding if ***.   Will see him back for follow-up in 3 months.   Of course, if any of his pending lab studies are abnormal, we will call him sooner for management.  The patient voices understanding of current disease status and treatment options and is in agreement with the current care plan.  All questions were answered. The patient knows to call the clinic with any problems, questions or concerns. We can certainly see the patient much sooner if necessary.  Thank you so much for allowing me to participate in the care of Gordon Madden. I will continue to follow up the patient with you and assist in his care.  I spent {CHL ONC TIME VISIT - [id number]} counseling the patient face to face. The total time spent in the appointment was {CHL ONC TIME VISIT - BJYNW:[id number]}.  Disclaimer: This note was dictated with voice recognition software. Similar sounding words can inadvertently be transcribed and may not be corrected upon review.   Hiba Garry L Belanna Manring May 20, 2023, 8:18 AM

## 2023-05-21 ENCOUNTER — Other Ambulatory Visit: Payer: Self-pay

## 2023-05-21 ENCOUNTER — Other Ambulatory Visit: Payer: Self-pay | Admitting: Physician Assistant

## 2023-05-21 ENCOUNTER — Inpatient Hospital Stay: Payer: Medicare HMO | Admitting: Physician Assistant

## 2023-05-21 ENCOUNTER — Inpatient Hospital Stay: Payer: Medicare HMO | Attending: Physician Assistant

## 2023-05-21 VITALS — BP 118/78 | HR 56 | Temp 98.1°F | Resp 16 | Wt 252.0 lb

## 2023-05-21 DIAGNOSIS — Z7982 Long term (current) use of aspirin: Secondary | ICD-10-CM | POA: Diagnosis not present

## 2023-05-21 DIAGNOSIS — M170 Bilateral primary osteoarthritis of knee: Secondary | ICD-10-CM | POA: Diagnosis not present

## 2023-05-21 DIAGNOSIS — Z79899 Other long term (current) drug therapy: Secondary | ICD-10-CM | POA: Diagnosis not present

## 2023-05-21 DIAGNOSIS — I2510 Atherosclerotic heart disease of native coronary artery without angina pectoris: Secondary | ICD-10-CM | POA: Insufficient documentation

## 2023-05-21 DIAGNOSIS — I130 Hypertensive heart and chronic kidney disease with heart failure and stage 1 through stage 4 chronic kidney disease, or unspecified chronic kidney disease: Secondary | ICD-10-CM | POA: Diagnosis not present

## 2023-05-21 DIAGNOSIS — N189 Chronic kidney disease, unspecified: Secondary | ICD-10-CM | POA: Insufficient documentation

## 2023-05-21 DIAGNOSIS — Z8673 Personal history of transient ischemic attack (TIA), and cerebral infarction without residual deficits: Secondary | ICD-10-CM | POA: Diagnosis not present

## 2023-05-21 DIAGNOSIS — K760 Fatty (change of) liver, not elsewhere classified: Secondary | ICD-10-CM | POA: Diagnosis not present

## 2023-05-21 DIAGNOSIS — G4733 Obstructive sleep apnea (adult) (pediatric): Secondary | ICD-10-CM | POA: Insufficient documentation

## 2023-05-21 DIAGNOSIS — E785 Hyperlipidemia, unspecified: Secondary | ICD-10-CM | POA: Diagnosis not present

## 2023-05-21 DIAGNOSIS — E1122 Type 2 diabetes mellitus with diabetic chronic kidney disease: Secondary | ICD-10-CM | POA: Insufficient documentation

## 2023-05-21 DIAGNOSIS — D696 Thrombocytopenia, unspecified: Secondary | ICD-10-CM

## 2023-05-21 DIAGNOSIS — D693 Immune thrombocytopenic purpura: Secondary | ICD-10-CM | POA: Insufficient documentation

## 2023-05-21 DIAGNOSIS — I252 Old myocardial infarction: Secondary | ICD-10-CM | POA: Insufficient documentation

## 2023-05-21 DIAGNOSIS — N400 Enlarged prostate without lower urinary tract symptoms: Secondary | ICD-10-CM | POA: Diagnosis not present

## 2023-05-21 DIAGNOSIS — I429 Cardiomyopathy, unspecified: Secondary | ICD-10-CM | POA: Insufficient documentation

## 2023-05-21 LAB — CMP (CANCER CENTER ONLY)
ALT: 20 U/L (ref 0–44)
AST: 28 U/L (ref 15–41)
Albumin: 4.1 g/dL (ref 3.5–5.0)
Alkaline Phosphatase: 45 U/L (ref 38–126)
Anion gap: 4 — ABNORMAL LOW (ref 5–15)
BUN: 24 mg/dL — ABNORMAL HIGH (ref 8–23)
CO2: 30 mmol/L (ref 22–32)
Calcium: 9.4 mg/dL (ref 8.9–10.3)
Chloride: 104 mmol/L (ref 98–111)
Creatinine: 0.82 mg/dL (ref 0.61–1.24)
GFR, Estimated: >60 mL/min (ref >60)
Glucose, Bld: 126 mg/dL — ABNORMAL HIGH (ref 70–99)
Potassium: 4.5 mmol/L (ref 3.5–5.1)
Sodium: 138 mmol/L (ref 135–145)
Total Bilirubin: 1 mg/dL (ref 0.3–1.2)
Total Protein: 6.5 g/dL (ref 6.5–8.1)

## 2023-05-21 LAB — CBC WITH DIFFERENTIAL (CANCER CENTER ONLY)
Abs Immature Granulocytes: 0.02 10*3/uL (ref 0.00–0.07)
Basophils Absolute: 0 10*3/uL (ref 0.0–0.1)
Basophils Relative: 0 %
Eosinophils Absolute: 0.5 10*3/uL (ref 0.0–0.5)
Eosinophils Relative: 10 %
HCT: 46.1 % (ref 39.0–52.0)
Hemoglobin: 15.9 g/dL (ref 13.0–17.0)
Immature Granulocytes: 0 %
Lymphocytes Relative: 19 %
Lymphs Abs: 1 10*3/uL (ref 0.7–4.0)
MCH: 32.9 pg (ref 26.0–34.0)
MCHC: 34.5 g/dL (ref 30.0–36.0)
MCV: 95.4 fL (ref 80.0–100.0)
Monocytes Absolute: 0.5 10*3/uL (ref 0.1–1.0)
Monocytes Relative: 11 %
Neutro Abs: 3 10*3/uL (ref 1.7–7.7)
Neutrophils Relative %: 60 %
Platelet Count: 81 10*3/uL — ABNORMAL LOW (ref 150–400)
RBC: 4.83 MIL/uL (ref 4.22–5.81)
RDW: 13.2 % (ref 11.5–15.5)
WBC Count: 5 10*3/uL (ref 4.0–10.5)
nRBC: 0 % (ref 0.0–0.2)

## 2023-05-21 LAB — HEPATITIS PANEL, ACUTE
HCV Ab: NONREACTIVE
Hep A IgM: NONREACTIVE
Hep B C IgM: NONREACTIVE
Hepatitis B Surface Ag: NONREACTIVE

## 2023-05-21 LAB — VITAMIN B12: Vitamin B-12: 595 pg/mL (ref 180–914)

## 2023-05-21 LAB — LACTATE DEHYDROGENASE: LDH: 136 U/L (ref 98–192)

## 2023-05-21 LAB — FOLATE: Folate: 17.5 ng/mL (ref >5.9)

## 2023-05-22 LAB — HIV ANTIBODY (ROUTINE TESTING W REFLEX): HIV Screen 4th Generation wRfx: NONREACTIVE

## 2023-05-24 LAB — RHEUMATOID FACTOR: Rheumatoid fact SerPl-aCnc: <10 IU/mL (ref <14.0)

## 2023-05-24 NOTE — Progress Notes (Signed)
Faxed last office notes and labs to Dr. Jacqulyn Bath.  Fax number [phone].  No other concerns at this time.

## 2023-05-26 LAB — ANTINUCLEAR ANTIBODIES, IFA: ANA Ab, IFA: NEGATIVE

## 2023-06-26 DIAGNOSIS — R3915 Urgency of urination: Secondary | ICD-10-CM | POA: Diagnosis not present

## 2023-06-26 DIAGNOSIS — N200 Calculus of kidney: Secondary | ICD-10-CM | POA: Diagnosis not present

## 2023-06-26 DIAGNOSIS — N401 Enlarged prostate with lower urinary tract symptoms: Secondary | ICD-10-CM | POA: Diagnosis not present

## 2023-06-26 DIAGNOSIS — R350 Frequency of micturition: Secondary | ICD-10-CM | POA: Diagnosis not present

## 2023-06-26 DIAGNOSIS — R3912 Poor urinary stream: Secondary | ICD-10-CM | POA: Diagnosis not present

## 2023-08-01 ENCOUNTER — Other Ambulatory Visit: Payer: Self-pay | Admitting: Interventional Cardiology

## 2023-08-07 DIAGNOSIS — M19011 Primary osteoarthritis, right shoulder: Secondary | ICD-10-CM | POA: Diagnosis not present

## 2023-08-21 DIAGNOSIS — H04123 Dry eye syndrome of bilateral lacrimal glands: Secondary | ICD-10-CM | POA: Diagnosis not present

## 2023-08-21 DIAGNOSIS — H43812 Vitreous degeneration, left eye: Secondary | ICD-10-CM | POA: Diagnosis not present

## 2023-08-21 DIAGNOSIS — H02834 Dermatochalasis of left upper eyelid: Secondary | ICD-10-CM | POA: Diagnosis not present

## 2023-08-21 DIAGNOSIS — Z961 Presence of intraocular lens: Secondary | ICD-10-CM | POA: Diagnosis not present

## 2023-08-21 DIAGNOSIS — H34231 Retinal artery branch occlusion, right eye: Secondary | ICD-10-CM | POA: Diagnosis not present

## 2023-08-21 DIAGNOSIS — H2181 Floppy iris syndrome: Secondary | ICD-10-CM | POA: Diagnosis not present

## 2023-08-21 DIAGNOSIS — H5703 Miosis: Secondary | ICD-10-CM | POA: Diagnosis not present

## 2023-08-21 DIAGNOSIS — H02831 Dermatochalasis of right upper eyelid: Secondary | ICD-10-CM | POA: Diagnosis not present

## 2023-10-08 DIAGNOSIS — E782 Mixed hyperlipidemia: Secondary | ICD-10-CM | POA: Diagnosis not present

## 2023-10-08 DIAGNOSIS — I2510 Atherosclerotic heart disease of native coronary artery without angina pectoris: Secondary | ICD-10-CM | POA: Diagnosis not present

## 2023-10-08 DIAGNOSIS — I700 Atherosclerosis of aorta: Secondary | ICD-10-CM | POA: Diagnosis not present

## 2023-10-08 DIAGNOSIS — I5022 Chronic systolic (congestive) heart failure: Secondary | ICD-10-CM | POA: Diagnosis not present

## 2023-10-08 DIAGNOSIS — I420 Dilated cardiomyopathy: Secondary | ICD-10-CM | POA: Diagnosis not present

## 2023-10-08 DIAGNOSIS — E1121 Type 2 diabetes mellitus with diabetic nephropathy: Secondary | ICD-10-CM | POA: Diagnosis not present

## 2023-10-08 DIAGNOSIS — H6123 Impacted cerumen, bilateral: Secondary | ICD-10-CM | POA: Diagnosis not present

## 2023-10-08 DIAGNOSIS — I429 Cardiomyopathy, unspecified: Secondary | ICD-10-CM | POA: Diagnosis not present

## 2023-10-08 DIAGNOSIS — Z0000 Encounter for general adult medical examination without abnormal findings: Secondary | ICD-10-CM | POA: Diagnosis not present

## 2023-10-08 DIAGNOSIS — I119 Hypertensive heart disease without heart failure: Secondary | ICD-10-CM | POA: Diagnosis not present

## 2023-11-06 DIAGNOSIS — M19011 Primary osteoarthritis, right shoulder: Secondary | ICD-10-CM | POA: Diagnosis not present

## 2023-11-07 ENCOUNTER — Other Ambulatory Visit: Payer: Self-pay | Admitting: Orthopedic Surgery

## 2023-11-07 ENCOUNTER — Telehealth: Payer: Self-pay

## 2023-11-07 DIAGNOSIS — M25511 Pain in right shoulder: Secondary | ICD-10-CM

## 2023-11-07 NOTE — Telephone Encounter (Signed)
   Pre-operative Risk Assessment    Patient Name: Gordon Madden  DOB: 04-03-1951 MRN: [id number]  Last ov:02/18/23 Dr. Bjorn Pippin  Upcoming ov: 01/16/24 Dr. Mayford Knife       Request for Surgical Clearance    Procedure:   Right total shoulder replacement   Date of Surgery:  Clearance TBD                                 Surgeon:  Eulas Post, MD  Surgeon's Group or Practice Name:  [facility] Phone number:  [phone] Fax number:  [phone]   Type of Clearance Requested:   - Medical  - Pharmacy:  Hold Aspirin Not indicated    Type of Anesthesia:  General interscalene block    Additional requests/questions:    [name]   11/07/2023, 5:38 PM

## 2023-11-08 ENCOUNTER — Telehealth: Payer: Self-pay | Admitting: *Deleted

## 2023-11-08 NOTE — Telephone Encounter (Signed)
S/w pt set up for tele phone clearacne/ Med rec and consent done. Will notify requesting surgeons office and will remove from pre op pool.    Patient Consent for Virtual Visit         [name] has provided verbal consent on 11/08/2023 for a virtual visit (video or telephone).   CONSENT FOR VIRTUAL VISIT FOR:  Gordon Madden  By participating in this virtual visit I agree to the following:  I hereby voluntarily request, consent and authorize [facility] and its employed or contracted physicians, physician assistants, nurse practitioners or other licensed health care professionals (the Practitioner), to provide me with telemedicine health care services (the "Services") as deemed necessary by the treating Practitioner. I acknowledge and consent to receive the Services by the Practitioner via telemedicine. I understand that the telemedicine visit will involve communicating with the Practitioner through live audiovisual communication technology and the disclosure of certain medical information by electronic transmission. I acknowledge that I have been given the opportunity to request an in-person assessment or other available alternative prior to the telemedicine visit and am voluntarily participating in the telemedicine visit.  I understand that I have the right to withhold or withdraw my consent to the use of telemedicine in the course of my care at any time, without affecting my right to future care or treatment, and that the Practitioner or I may terminate the telemedicine visit at any time. I understand that I have the right to inspect all information obtained and/or recorded in the course of the telemedicine visit and may receive copies of available information for a reasonable fee.  I understand that some of the potential risks of receiving the Services via telemedicine include:  Delay or interruption in medical evaluation due to technological equipment failure or  disruption; Information transmitted may not be sufficient (e.g. poor resolution of images) to allow for appropriate medical decision making by the Practitioner; and/or  In rare instances, security protocols could fail, causing a breach of personal health information.  Furthermore, I acknowledge that it is my responsibility to provide information about my medical history, conditions and care that is complete and accurate to the best of my ability. I acknowledge that Practitioner's advice, recommendations, and/or decision may be based on factors not within their control, such as incomplete or inaccurate data provided by me or distortions of diagnostic images or specimens that may result from electronic transmissions. I understand that the practice of medicine is not an exact science and that Practitioner makes no warranties or guarantees regarding treatment outcomes. I acknowledge that a copy of this consent can be made available to me via my patient portal [facility] MyChart), or I can request a printed copy by calling the office of [facility].    I understand that my insurance will be billed for this visit.   I have read or had this consent read to me. I understand the contents of this consent, which adequately explains the benefits and risks of the Services being provided via telemedicine.  I have been provided ample opportunity to ask questions regarding this consent and the Services and have had my questions answered to my satisfaction. I give my informed consent for the services to be provided through the use of telemedicine in my medical care

## 2023-11-08 NOTE — Telephone Encounter (Signed)
   Name: Gordon Madden  DOB: Apr 21, 1951  MRN: [id number]  Primary Cardiologist: Lance Muss, MD   Preoperative team, please contact this patient and set up a phone call appointment for further preoperative risk assessment. Please obtain consent and complete medication review. Thank you for your help. Former patient of Dr. Eldridge Dace,  Is to see Dr. Armanda Magic in February of 2025. Has not been seen since 01/17/2023.  I confirm that guidance regarding antiplatelet and oral anticoagulation therapy has been completed and, if necessary, noted below.  Per office protocol, if patient is without any new symptoms or concerns at the time of their virtual visit, he/she may hold ASA for 7 days prior to procedure. Please resume ASA as soon as possible postprocedure, at the discretion of the surgeon.    I also confirmed the patient resides in the state of [place]. As per [facility] Medical Board telemedicine laws, the patient must reside in the state in which the provider is licensed.   Joni Reining, NP 11/08/2023, 8:02 AM [facility]

## 2023-11-09 ENCOUNTER — Other Ambulatory Visit: Payer: Self-pay | Admitting: Interventional Cardiology

## 2023-11-14 DIAGNOSIS — H6522 Chronic serous otitis media, left ear: Secondary | ICD-10-CM | POA: Diagnosis not present

## 2023-11-14 DIAGNOSIS — H90A32 Mixed conductive and sensorineural hearing loss, unilateral, left ear with restricted hearing on the contralateral side: Secondary | ICD-10-CM | POA: Diagnosis not present

## 2023-11-18 ENCOUNTER — Ambulatory Visit
Admission: RE | Admit: 2023-11-18 | Discharge: 2023-11-18 | Disposition: A | Discharge status: Home / Self Care | Payer: Medicare HMO | Source: Ambulatory Visit | Attending: Orthopedic Surgery | Admitting: Orthopedic Surgery

## 2023-11-18 DIAGNOSIS — H6522 Chronic serous otitis media, left ear: Secondary | ICD-10-CM | POA: Diagnosis not present

## 2023-11-18 DIAGNOSIS — M19011 Primary osteoarthritis, right shoulder: Secondary | ICD-10-CM | POA: Diagnosis not present

## 2023-11-18 DIAGNOSIS — M25511 Pain in right shoulder: Secondary | ICD-10-CM

## 2023-11-18 DIAGNOSIS — H90A32 Mixed conductive and sensorineural hearing loss, unilateral, left ear with restricted hearing on the contralateral side: Secondary | ICD-10-CM | POA: Diagnosis not present

## 2023-11-19 ENCOUNTER — Ambulatory Visit: Payer: Medicare HMO | Admitting: Internal Medicine

## 2023-11-19 ENCOUNTER — Other Ambulatory Visit: Payer: Medicare HMO

## 2023-12-03 NOTE — Progress Notes (Signed)
 Virtual Visit via Telephone Note   Because of Gordon Madden's co-morbid illnesses, he is at least at moderate risk for complications without adequate follow up.  This format is felt to be most appropriate for this patient at this time.  The patient did not have access to video technology/had technical difficulties with video requiring transitioning to audio format only (telephone).  All issues noted in this document were discussed and addressed.  No physical exam could be performed with this format.  Please refer to the patient's chart for his consent to telehealth for [facility].  Evaluation Performed:  Preoperative cardiovascular risk assessment _____________   Date:  12/03/2023   Patient ID:  Gordon Madden, DOB 04-04-1951, MRN [id number] Patient Location:  Home Provider location:   Office  Primary Care Provider:  Vernon Velna SAUNDERS, MD Primary Cardiologist:  Candyce Reek, MD  Chief Complaint / Patient Profile   73 y.o. y/o male with a h/o CAD s/p MI in 2004 in [place], chronic HFrEF with last EF 40-45% March 2024, hypertension, hyperlipidemia, CVA, OSA, T2DM, CKD who is pending right total shoulder replacement by Dr. Josefina and presents today for telephonic preoperative cardiovascular risk assessment.  History of Present Illness    Gordon Madden is a 73 y.o. male who presents via audio/video conferencing for a telehealth visit today.  Pt was last seen in cardiology clinic on 01/17/2023 by Dr. Reek.  At that time [name] was stable from a cardiac standpoint.  The patient is now pending procedure as outlined above. Since his last visit, he is doing well. Patient denies shortness of breath, dyspnea on exertion, lower extremity edema, orthopnea or PND. No chest pain, pressure, or tightness. No palpitations. He has lost 50-60 lb since his last visit. He stays active walking 1-1.5 miles 5 days a week.   Past Medical History    Past Medical History:  Diagnosis Date    Arthritis    Ascending aorta dilatation (HCC)    BPH (benign prostatic hyperplasia)    CAD (coronary artery disease)    a. MI 2004 with occ RCA.   Cardiomyopathy    EF 40%   Chronic systolic CHF (congestive heart failure) (HCC)    Colon polyps    CPAP (continuous positive airway pressure) dependence    Diabetes mellitus, type 2 (HCC)    Heart murmur    History of gout    History of kidney stones    Hyperlipidemia    Hypertension    Idiopathic thrombocytopenia (HCC)    Morbid obesity (HCC)    Myocardial infarction (HCC)    OSA (obstructive sleep apnea)    07/25/10 AHI 14.9/hr   Pneumonia    PONV (postoperative nausea and vomiting)    Primary localized osteoarthritis of left knee 06/07/2015   Primary localized osteoarthritis of right knee 08/06/2017   Rhinosinusitis    Shortness of breath dyspnea    Stroke [facility])    right amaurosis fugax, concern for retinal artery occlusion 12/2009; cerebral arteriography 01/30/10: no occlusion, stenoses, dissections, aneurysms, AVM noted, patent opthalmic arteries   Vision decreased    Past Surgical History:  Procedure Laterality Date   CARDIAC CATHETERIZATION  2005   no stents   CHOLECYSTECTOMY N/A 02/02/2021   Procedure: LAPAROSCOPIC CHOLECYSTECTOMY;  Surgeon: Dasie Leonor CROME, MD;  Location: [facility];  Service: General;  Laterality: N/A;   CYST EXCISION     chest and back   HERNIA REPAIR  umbilical   JOINT REPLACEMENT     Left 06/07/15   KNEE CARTILAGE SURGERY     left   KNEE SURGERY Right 08/06/2017   MENISCUS REPAIR Left    NASAL SINUS SURGERY     PARTIAL KNEE ARTHROPLASTY Right 08/06/2017   Procedure: UNICOMPARTMENTAL KNEE;  Surgeon: Josefina Chew, MD;  Location: [facility] OR;  Service: Orthopedics;  Laterality: Right;   SINUS IRRIGATION     TOTAL KNEE ARTHROPLASTY Left 06/07/2015   Procedure: TOTAL KNEE ARTHROPLASTY;  Surgeon: Chew Josefina, MD;  Location: [facility] OR;  Service: Orthopedics;  Laterality: Left;   TOTAL KNEE ARTHROPLASTY WITH  REVISION COMPONENTS Right 10/23/2022   Procedure: TOTAL KNEE ARTHROPLASTY WITH REVISION COMPONENTS;  Surgeon: Josefina Chew, MD;  Location: [facility];  Service: Orthopedics;  Laterality: Right;   UMBILICAL HERNIA REPAIR      Allergies  Allergies  Allergen Reactions   Plavix  [Clopidogrel ] Other (See Comments)    Thrombocytopenia-plavix  causes low platelets but pt's MD monitors pt closely, and pt does take plavix .     Home Medications    Prior to Admission medications   Medication Sig Start Date End Date Taking? Authorizing Provider  aspirin  EC 81 MG tablet Take 1 tablet (81 mg total) by mouth daily. Swallow whole. 04/30/23   Dann Candyce RAMAN, MD  carvedilol  (COREG ) 6.25 MG tablet TAKE 1 TABLET TWICE DAILY 08/01/23   Varanasi, Jayadeep S, MD  Coenzyme Q10 (COQ-10 PO) Take 300 mg by mouth daily.    [provider]  Continuous Glucose Sensor (DEXCOM G6 SENSOR) MISC To check blood sugars as directed for 10 days Dx E11.21 10/08/23   [provider]  ENTRESTO  97-103 MG TAKE 1 TABLET TWICE DAILY 01/23/23   Dann Candyce RAMAN, MD  fexofenadine (ALLEGRA) 180 MG tablet Take 180 mg by mouth daily.    [provider]  finasteride  (PROSCAR ) 5 MG tablet Take 5 mg by mouth daily. 06/16/22   [provider]  FLUZONE HIGH-DOSE 0.5 ML injection  08/09/23   [provider]  folic acid  (FOLVITE ) 400 MCG tablet Take 400 mcg by mouth daily.    [provider]  rosuvastatin  (CRESTOR ) 20 MG tablet TAKE 1 TABLET BY MOUTH DAILY *STOP PRAVASTATIN * 11/11/23   Shlomo Wilbert SAUNDERS, MD  silodosin (RAPAFLO) 8 MG CAPS capsule Take 8 mg by mouth daily. 04/25/22   [provider]  vitamin B-12 (CYANOCOBALAMIN ) 1000 MCG tablet Take 1,000 mcg by mouth daily.    [provider]    Physical Exam    Vital Signs:  DETRICH RAKESTRAW does not have vital signs available for review today.  Given telephonic nature of communication, physical exam is limited. AAOx3.  NAD. Normal affect.  Speech and respirations are unlabored.  Assessment & Plan    Primary Cardiologist: Candyce Dann, MD  Preoperative cardiovascular risk assessment. Right total shoulder replacement by Dr. Josefina.  Chart reviewed as part of pre-operative protocol coverage. According to the RCRI, patient has a 11% risk of MACE. Patient reports activity equivalent to >4.0 METS (walks 1-1.5 miles 5 days a week).   Given past medical history and time since last visit, based on ACC/AHA guidelines, KYE HEDDEN would be at acceptable risk for the planned procedure without further cardiovascular testing.   Patient was advised that if he develops new symptoms prior to surgery to contact our office to arrange a follow-up appointment.  he verbalized understanding.  Ideally aspirin  should be continued without interruption, however if the bleeding  risk is too great, aspirin  may be held for 5-7 days prior to surgery. Please resume aspirin  post operatively when it is felt to be safe from a bleeding standpoint.    I will route this recommendation to the requesting party via Epic fax function.  Please call with questions.  Time:   Today, I have spent 6 minutes with the patient with telehealth technology discussing medical history, symptoms, and management plan.     Barnie Hila, NP  12/03/2023, 4:18 PM

## 2023-12-04 ENCOUNTER — Ambulatory Visit: Payer: Medicare HMO | Attending: Cardiology | Admitting: Student

## 2023-12-04 DIAGNOSIS — Z01810 Encounter for preprocedural cardiovascular examination: Secondary | ICD-10-CM

## 2023-12-05 ENCOUNTER — Inpatient Hospital Stay: Payer: Medicare HMO | Admitting: Internal Medicine

## 2023-12-05 ENCOUNTER — Inpatient Hospital Stay: Payer: Medicare HMO | Attending: Internal Medicine

## 2023-12-05 VITALS — BP 113/74 | HR 58 | Temp 98.0°F | Resp 17 | Ht 71.0 in | Wt 255.3 lb

## 2023-12-05 DIAGNOSIS — D696 Thrombocytopenia, unspecified: Secondary | ICD-10-CM | POA: Diagnosis not present

## 2023-12-05 DIAGNOSIS — Z862 Personal history of diseases of the blood and blood-forming organs and certain disorders involving the immune mechanism: Secondary | ICD-10-CM

## 2023-12-05 LAB — CBC WITH DIFFERENTIAL (CANCER CENTER ONLY)
Abs Immature Granulocytes: 0 10*3/uL (ref 0.00–0.07)
Basophils Absolute: 0 10*3/uL (ref 0.0–0.1)
Basophils Relative: 0 %
Eosinophils Absolute: 0.5 10*3/uL (ref 0.0–0.5)
Eosinophils Relative: 12 %
HCT: 44.3 % (ref 39.0–52.0)
Hemoglobin: 14.7 g/dL (ref 13.0–17.0)
Immature Granulocytes: 0 %
Lymphocytes Relative: 16 %
Lymphs Abs: 0.7 10*3/uL (ref 0.7–4.0)
MCH: 32 pg (ref 26.0–34.0)
MCHC: 33.2 g/dL (ref 30.0–36.0)
MCV: 96.3 fL (ref 80.0–100.0)
Monocytes Absolute: 0.6 10*3/uL (ref 0.1–1.0)
Monocytes Relative: 14 %
Neutro Abs: 2.4 10*3/uL (ref 1.7–7.7)
Neutrophils Relative %: 58 %
Platelet Count: 73 10*3/uL — ABNORMAL LOW (ref 150–400)
RBC: 4.6 MIL/uL (ref 4.22–5.81)
RDW: 13.2 % (ref 11.5–15.5)
WBC Count: 4.1 10*3/uL (ref 4.0–10.5)
nRBC: 0 % (ref 0.0–0.2)

## 2023-12-05 NOTE — Progress Notes (Signed)
 [facility] [phone]   [phone]  OFFICE PROGRESS NOTE  Pahwani, Velna SAUNDERS, MD [address] [place] [address]  DIAGNOSIS: Mild thrombocytopenia likely secondary to liver disease versus ITP  PRIOR THERAPY: None  CURRENT THERAPY: Observation  INTERVAL HISTORY: Gordon Madden 73 y.o. male returns to the clinic today for follow-up visit.Discussed the use of AI scribe software for clinical note transcription with the patient, who gave verbal consent to proceed.  History of Present Illness   The 73 year old patient with a history of liver disease and idiopathic thrombocytopenic purpura (ITP) has been under observation for low platelet counts since 2016. The patient reports no significant changes in his health status since the last consultation. He denies any new instances of bleeding, bruising, gum or nose bleeds, or blood in the stool. However, he has noticed the appearance of small red spots on his skin. The patient's platelet count remains consistent at 73,000, similar to previous readings. All other blood parameters, including white blood cell count and hemoglobin, are within normal ranges. The patient has been living with low platelet counts for several years. No changes in medication have been reported.       MEDICAL HISTORY: Past Medical History:  Diagnosis Date   Arthritis    Ascending aorta dilatation (HCC)    BPH (benign prostatic hyperplasia)    CAD (coronary artery disease)    a. MI 2004 with occ RCA.   Cardiomyopathy    EF 40%   Chronic systolic CHF (congestive heart failure) (HCC)    Colon polyps    CPAP (continuous positive airway pressure) dependence    Diabetes mellitus, type 2 (HCC)    Heart murmur    History of gout    History of kidney stones    Hyperlipidemia    Hypertension    Idiopathic thrombocytopenia (HCC)    Morbid obesity (HCC)    Myocardial infarction (HCC)    OSA (obstructive sleep apnea)     07/25/10 AHI 14.9/hr   Pneumonia    PONV (postoperative nausea and vomiting)    Primary localized osteoarthritis of left knee 06/07/2015   Primary localized osteoarthritis of right knee 08/06/2017   Rhinosinusitis    Shortness of breath dyspnea    Stroke [facility])    right amaurosis fugax, concern for retinal artery occlusion 12/2009; cerebral arteriography 01/30/10: no occlusion, stenoses, dissections, aneurysms, AVM noted, patent opthalmic arteries   Vision decreased     ALLERGIES:  is allergic to plavix  [clopidogrel ].  MEDICATIONS:  Current Outpatient Medications  Medication Sig Dispense Refill   aspirin  EC 81 MG tablet Take 1 tablet (81 mg total) by mouth daily. Swallow whole. 90 tablet 3   carvedilol  (COREG ) 6.25 MG tablet TAKE 1 TABLET TWICE DAILY 180 tablet 3   Coenzyme Q10 (COQ-10 PO) Take 300 mg by mouth daily.     Continuous Glucose Sensor (DEXCOM G6 SENSOR) MISC To check blood sugars as directed for 10 days Dx E11.21     ENTRESTO  97-103 MG TAKE 1 TABLET TWICE DAILY 180 tablet 3   fexofenadine (ALLEGRA) 180 MG tablet Take 180 mg by mouth daily.     finasteride  (PROSCAR ) 5 MG tablet Take 5 mg by mouth daily.     FLUZONE HIGH-DOSE 0.5 ML injection      folic acid  (FOLVITE ) 400 MCG tablet Take 400 mcg by mouth daily.     rosuvastatin  (CRESTOR ) 20 MG tablet TAKE 1 TABLET BY  MOUTH DAILY *STOP PRAVASTATIN * 90 tablet 0   silodosin (RAPAFLO) 8 MG CAPS capsule Take 8 mg by mouth daily.     vitamin B-12 (CYANOCOBALAMIN ) 1000 MCG tablet Take 1,000 mcg by mouth daily.     No current facility-administered medications for this visit.    SURGICAL HISTORY:  Past Surgical History:  Procedure Laterality Date   CARDIAC CATHETERIZATION  2005   no stents   CHOLECYSTECTOMY N/A 02/02/2021   Procedure: LAPAROSCOPIC CHOLECYSTECTOMY;  Surgeon: Dasie Leonor CROME, MD;  Location: [facility];  Service: General;  Laterality: N/A;   CYST EXCISION     chest and back   HERNIA REPAIR     umbilical   JOINT  REPLACEMENT     Left 06/07/15   KNEE CARTILAGE SURGERY     left   KNEE SURGERY Right 08/06/2017   MENISCUS REPAIR Left    NASAL SINUS SURGERY     PARTIAL KNEE ARTHROPLASTY Right 08/06/2017   Procedure: UNICOMPARTMENTAL KNEE;  Surgeon: Josefina Chew, MD;  Location: [facility];  Service: Orthopedics;  Laterality: Right;   SINUS IRRIGATION     TOTAL KNEE ARTHROPLASTY Left 06/07/2015   Procedure: TOTAL KNEE ARTHROPLASTY;  Surgeon: Chew Josefina, MD;  Location: [facility] OR;  Service: Orthopedics;  Laterality: Left;   TOTAL KNEE ARTHROPLASTY WITH REVISION COMPONENTS Right 10/23/2022   Procedure: TOTAL KNEE ARTHROPLASTY WITH REVISION COMPONENTS;  Surgeon: Josefina Chew, MD;  Location: [facility];  Service: Orthopedics;  Laterality: Right;   UMBILICAL HERNIA REPAIR      REVIEW OF SYSTEMS:  A comprehensive review of systems was negative.   PHYSICAL EXAMINATION: General appearance: alert, cooperative, and no distress Head: Normocephalic, without obvious abnormality, atraumatic Neck: no adenopathy, no JVD, supple, symmetrical, trachea midline, and thyroid not enlarged, symmetric, no tenderness/mass/nodules Lymph nodes: Cervical, supraclavicular, and axillary nodes normal. Resp: clear to auscultation bilaterally Back: symmetric, no curvature. ROM normal. No CVA tenderness. Cardio: regular rate and rhythm, S1, S2 normal, no murmur, click, rub or gallop GI: soft, non-tender; bowel sounds normal; no masses,  no organomegaly Extremities: extremities normal, atraumatic, no cyanosis or edema  ECOG PERFORMANCE STATUS: 0 - Asymptomatic  Blood pressure 113/74, pulse (!) 58, temperature 98 F (36.7 C), temperature source Temporal, resp. rate 17, height 5' 11 (1.803 m), weight 255 lb 4.8 oz (115.8 kg), SpO2 100%.  LABORATORY DATA: Lab Results  Component Value Date   WBC 4.1 12/05/2023   HGB 14.7 12/05/2023   HCT 44.3 12/05/2023   MCV 96.3 12/05/2023   PLT 73 (L) 12/05/2023      Chemistry      Component Value  Date/Time   NA 138 05/21/2023 1430   NA 138 10/02/2017 1345   NA 137 05/09/2015 1111   K 4.5 05/21/2023 1430   K 4.7 05/09/2015 1111   CL 104 05/21/2023 1430   CO2 30 05/21/2023 1430   CO2 22 05/09/2015 1111   BUN 24 (H) 05/21/2023 1430   BUN 17 10/02/2017 1345   BUN 15.2 05/09/2015 1111   CREATININE 0.82 05/21/2023 1430   CREATININE 0.8 05/09/2015 1111      Component Value Date/Time   CALCIUM  9.4 05/21/2023 1430   CALCIUM  8.7 05/09/2015 1111   ALKPHOS 45 05/21/2023 1430   ALKPHOS 46 05/09/2015 1111   AST 28 05/21/2023 1430   AST 16 05/09/2015 1111   ALT 20 05/21/2023 1430   ALT 17 05/09/2015 1111   BILITOT 1.0 05/21/2023 1430   BILITOT 0.82 05/09/2015 1111  RADIOGRAPHIC STUDIES: CT SHOULDER RIGHT WO CONTRAST Result Date: 12/04/2023 CLINICAL DATA:  Preop evaluation for right shoulder replacement. EXAM: CT OF THE UPPER RIGHT EXTREMITY WITHOUT CONTRAST TECHNIQUE: Multidetector CT imaging of the upper right extremity was performed according to the standard protocol. RADIATION DOSE REDUCTION: This exam was performed according to the departmental dose-optimization program which includes automated exposure control, adjustment of the mA and/or kV according to patient size and/or use of iterative reconstruction technique. COMPARISON:  None Available. FINDINGS: Bones/Joint/Cartilage No acute fracture or dislocation. Normal alignment. Small glenohumeral joint effusion. Severe degenerative changes of the right glenohumeral joint manifested by joint space narrowing, humeral head and inferior glenoid rim marginal osteophytosis, and subchondral sclerosis. Subcortical cystic changes at the greater tuberosity are suggestive of chronic rotator cuff pathology. Moderate degenerative changes of the acromioclavicular joint manifested by joint space narrowing and marginal osteophytosis. Ligaments Ligaments are suboptimally evaluated by CT. Muscles and Tendons Muscles are normal. No significant muscle  atrophy. Soft tissue No fluid collection or hematoma. No enlarged lymph nodes identified in the field of view. IMPRESSION: 1. Severe osteoarthritis of the right glenohumeral joint. 2. Moderate osteoarthritis of the right acromioclavicular joint. 3. No acute osseous abnormality. Electronically Signed   By: Harrietta Sherry M.D.   On: 12/04/2023 13:19    ASSESSMENT AND PLAN:     Thrombocytopenia Chronic thrombocytopenia, likely secondary to liver disease or idiopathic thrombocytopenic purpura (ITP). Platelet count stable at 73,000. No new symptoms of bleeding, bruising, or petechiae. Other blood counts (WBC, hemoglobin, hematocrit) are within normal limits. Discussed importance of monitoring for symptoms such as bleeding, bruising, and petechiae. Patient prefers annual monitoring. - Monitor platelet count annually - Instruct patient to report any new symptoms of bleeding, bruising, or petechiae immediately  General Health Maintenance No new symptoms. No changes in medications. - Provide a copy of blood work results - Schedule follow-up appointment in one year.   The patient was advised to call immediately if he has any concerning symptoms in the interval. The patient voices understanding of current disease status and treatment options and is in agreement with the current care plan.  All questions were answered. The patient knows to call the clinic with any problems, questions or concerns. We can certainly see the patient much sooner if necessary.  The total time spent in the appointment was 20 minutes.  Disclaimer: This note was dictated with voice recognition software. Similar sounding words can inadvertently be transcribed and may not be corrected upon review.

## 2023-12-12 ENCOUNTER — Telehealth: Payer: Self-pay | Admitting: Medical Oncology

## 2023-12-12 ENCOUNTER — Telehealth: Payer: Self-pay | Admitting: Interventional Cardiology

## 2023-12-12 NOTE — Telephone Encounter (Signed)
Patient called to follow-up on the status of his surgery clearance.  Patient stated clearance can be faxed ATTN:  Sherri at [facility] [place].  Fax# [phone].

## 2023-12-12 NOTE — Telephone Encounter (Signed)
Clearance notes have been re-faxed today to correct fax# [phone].

## 2023-12-12 NOTE — Telephone Encounter (Signed)
Surgical clearance form -I gave fax number to [facility] @ [place] [facility] [place].

## 2023-12-13 ENCOUNTER — Telehealth: Payer: Self-pay

## 2023-12-13 NOTE — Telephone Encounter (Signed)
Surgical clearance form faxed to [phone]. Fax confirmed.

## 2024-01-07 NOTE — Patient Instructions (Signed)
SURGICAL WAITING ROOM VISITATION  Patients having surgery or a procedure may have no more than 2 support people in the waiting area - these visitors may rotate.    Children under the age of 62 must have an adult with them who is not the patient.  Due to an increase in RSV and influenza rates and associated hospitalizations, children ages 30 and under may not visit patients in [facility] hospitals.  Visitors with respiratory illnesses are discouraged from visiting and should remain at home.  If the patient needs to stay at the hospital during part of their recovery, the visitor guidelines for inpatient rooms apply. Pre-op nurse will coordinate an appropriate time for 1 support person to accompany patient in pre-op.  This support person may not rotate.    Please refer to the [facility] website for the visitor guidelines for Inpatients (after your surgery is over and you are in a regular room).       Your procedure is scheduled on: 01-21-24   Report to [facility] Main Entrance    Report to admitting at        0900 AM   Call this number if you have problems the morning of surgery [phone]   Do not eat food :After Midnight.   After Midnight you may have the following liquids until _0830 _____ AM/DAY OF SURGERY   then nothing by  [place] Non-Citrus Juices (without pulp, NO RED-Apple, White grape, White cranberry) Black Coffee (NO MILK/CREAM OR CREAMERS, sugar ok)  Clear Tea (NO MILK/CREAM OR CREAMERS, sugar ok) regular and decaf                             Plain Jell-O (NO RED)                                           Fruit ices (not with fruit pulp, NO RED)                                     Popsicles (NO RED)                                                               Sports drinks like Gatorade (NO RED)                    The day of surgery:  Drink ONE (1) Pre-Surgery G2 BY 0830  AM the morning of surgery. Drink in one sitting. Do not sip.  This drink was  given to you during your hospital  pre-op appointment visit. Nothing else to drink after completing the  Pre-SurgeryG2.          If you have questions, please contact your surgeon's office.   FOLLOW  ANY ADDITIONAL PRE OP INSTRUCTIONS YOU RECEIVED FROM YOUR SURGEON'S OFFICE!!!     Oral Hygiene is also important to reduce your risk of infection.  Remember - BRUSH YOUR TEETH THE MORNING OF SURGERY WITH YOUR REGULAR TOOTHPASTE  DENTURES WILL BE REMOVED PRIOR TO SURGERY PLEASE DO NOT APPLY "Poly grip" OR ADHESIVES!!!   Do NOT smoke after Midnight   Stop all vitamins and herbal supplements 7 days before surgery.   Take these medicines the morning of surgery with A SIP OF WATER: Carvedilol, allegra, finasteride, Rapaflo  DO NOT TAKE ANY ORAL DIABETIC MEDICATIONS DAY OF YOUR SURGERY  Bring CPAP mask and tubing day of surgery.                              You may not have any metal on your body including hair pins, jewelry, and body piercing             Do not wear  lotions, powders, perfumes/cologne, or deodorant                Men may shave face and neck.   Do not bring valuables to the hospital. [facility] IS NOT             RESPONSIBLE   FOR VALUABLES.   Contacts, glasses, dentures or bridgework may not be worn into surgery.   Bring small overnight bag day of surgery.   DO NOT BRING YOUR HOME MEDICATIONS TO THE HOSPITAL. PHARMACY WILL DISPENSE MEDICATIONS LISTED ON YOUR MEDICATION LIST TO YOU DURING YOUR ADMISSION IN THE HOSPITAL!    Patients discharged on the day of surgery will not be allowed to drive home.  Someone NEEDS to stay with you for the first 24 hours after anesthesia.   Special Instructions: Bring a copy of your healthcare power of attorney and living will documents the day of surgery if you haven't scanned them before.              Please read over the following fact sheets you were given: IF YOU HAVE QUESTIONS ABOUT YOUR  PRE-OP INSTRUCTIONS PLEASE CALL [phone]   IIf you test positive for Covid or have been in contact with anyone that has tested positive in the last 10 days please notify you surgeon.      Pre-operative 5 CHG Bath Instructions   You can play a key role in reducing the risk of infection after surgery. Your skin needs to be as free of germs as possible. You can reduce the number of germs on your skin by washing with CHG (chlorhexidine gluconate) soap before surgery. CHG is an antiseptic soap that kills germs and continues to kill germs even after washing.   DO NOT use if you have an allergy to chlorhexidine/CHG or antibacterial soaps. If your skin becomes reddened or irritated, stop using the CHG and notify one of our RNs at [phone].   Please shower with the CHG soap starting 4 days before surgery using the following schedule:     Please keep in mind the following:  DO NOT shave, including legs and underarms, starting the day of your first shower.   You may shave your face at any point before/day of surgery.  Place clean sheets on your bed the day you start using CHG soap. Use a clean washcloth (not used since being washed) for each shower. DO NOT sleep with pets once you start using the CHG.   CHG Shower Instructions:  If you choose to wash your hair and private area, wash first with your normal shampoo/soap.  After you use shampoo/soap,  rinse your hair and body thoroughly to remove shampoo/soap residue.  Turn the water OFF and apply about 3 tablespoons (45 ml) of CHG soap to a CLEAN washcloth.  Apply CHG soap ONLY FROM YOUR NECK DOWN TO YOUR TOES (washing for 3-5 minutes)  DO NOT use CHG soap on face, private areas, open wounds, or sores.  Pay special attention to the area where your surgery is being performed.  If you are having back surgery, having someone wash your back for you may be helpful. Wait 2 minutes after CHG soap is applied, then you may rinse off the CHG soap.   Pat dry with a clean towel  Put on clean clothes/pajamas   If you choose to wear lotion, please use ONLY the CHG-compatible lotions on the back of this paper.     Additional instructions for the day of surgery: DO NOT APPLY any lotions, deodorants, cologne, or perfumes.   Put on clean/comfortable clothes.  Brush your teeth.  Ask your nurse before applying any prescription medications to the skin.      CHG Compatible Lotions   Aveeno Moisturizing lotion  Cetaphil Moisturizing Cream  Cetaphil Moisturizing Lotion  Clairol Herbal Essence Moisturizing Lotion, Dry Skin  Clairol Herbal Essence Moisturizing Lotion, Extra Dry Skin  Clairol Herbal Essence Moisturizing Lotion, Normal Skin  Curel Age Defying Therapeutic Moisturizing Lotion with Alpha Hydroxy  Curel Extreme Care Body Lotion  Curel Soothing Hands Moisturizing Hand Lotion  Curel Therapeutic Moisturizing Cream, Fragrance-Free  Curel Therapeutic Moisturizing Lotion, Fragrance-Free  Curel Therapeutic Moisturizing Lotion, Original Formula  Eucerin Daily Replenishing Lotion  Eucerin Dry Skin Therapy Plus Alpha Hydroxy Crme  Eucerin Dry Skin Therapy Plus Alpha Hydroxy Lotion  Eucerin Original Crme  Eucerin Original Lotion  Eucerin Plus Crme Eucerin Plus Lotion  Eucerin TriLipid Replenishing Lotion  Keri Anti-Bacterial Hand Lotion  Keri Deep Conditioning Original Lotion Dry Skin Formula Softly Scented  Keri Deep Conditioning Original Lotion, Fragrance Free Sensitive Skin Formula  Keri Lotion Fast Absorbing Fragrance Free Sensitive Skin Formula  Keri Lotion Fast Absorbing Softly Scented Dry Skin Formula  Keri Original Lotion  Keri Skin Renewal Lotion Keri Silky Smooth Lotion  Keri Silky Smooth Sensitive Skin Lotion  Nivea Body Creamy Conditioning Oil  Nivea Body Extra Enriched Lotion  Nivea Body Original Lotion  Nivea Body Sheer Moisturizing Lotion Nivea Crme  Nivea Skin Firming Lotion  NutraDerm 30 Skin Lotion   NutraDerm Skin Lotion  NutraDerm Therapeutic Skin Cream  NutraDerm Therapeutic Skin Lotion  ProShield Protective Hand Cream  Provon moisturizing lotion   Incentive Spirometer  An incentive spirometer is a tool that can help keep your lungs clear and active. This tool measures how well you are filling your lungs with each breath. Taking long deep breaths may help reverse or decrease the chance of developing breathing (pulmonary) problems (especially infection) following: A long period of time when you are unable to move or be active. BEFORE THE PROCEDURE  If the spirometer includes an indicator to show your best effort, your nurse or respiratory therapist will set it to a desired goal. If possible, sit up straight or lean slightly forward. Try not to slouch. Hold the incentive spirometer in an upright position. INSTRUCTIONS FOR USE  Sit on the edge of your bed if possible, or sit up as far as you can in bed or on a chair. Hold the incentive spirometer in an upright position. Breathe out normally. Place the mouthpiece in your mouth and seal your lips tightly  around it. Breathe in slowly and as deeply as possible, raising the piston or the ball toward the top of the column. Hold your breath for 3-5 seconds or for as long as possible. Allow the piston or ball to fall to the bottom of the column. Remove the mouthpiece from your mouth and breathe out normally. Rest for a few seconds and repeat Steps 1 through 7 at least 10 times every 1-2 hours when you are awake. Take your time and take a few normal breaths between deep breaths. The spirometer may include an indicator to show your best effort. Use the indicator as a goal to work toward during each repetition. After each set of 10 deep breaths, practice coughing to be sure your lungs are clear. If you have an incision (the cut made at the time of surgery), support your incision when coughing by placing a pillow or rolled up towels firmly against  it. Once you are able to get out of bed, walk around indoors and cough well. You may stop using the incentive spirometer when instructed by your caregiver.  RISKS AND COMPLICATIONS Take your time so you do not get dizzy or light-headed. If you are in pain, you may need to take or ask for pain medication before doing incentive spirometry. It is harder to take a deep breath if you are having pain. AFTER USE Rest and breathe slowly and easily. It can be helpful to keep track of a log of your progress. Your caregiver can provide you with a simple table to help with this. If you are using the spirometer at home, follow these instructions: SEEK MEDICAL CARE IF:  You are having difficultly using the spirometer. You have trouble using the spirometer as often as instructed. Your pain medication is not giving enough relief while using the spirometer. You develop fever of 100.5 F (38.1 C) or higher. SEEK IMMEDIATE MEDICAL CARE IF:  You cough up bloody sputum that had not been present before. You develop fever of 102 F (38.9 C) or greater. You develop worsening pain at or near the incision site. MAKE SURE YOU:  Understand these instructions. Will watch your condition. Will get help right away if you are not doing well or get worse. Document Released: 03/25/2007 Document Revised: 02/04/2012 Document Reviewed: 05/26/2007 [facility] Patient Information 2014 [place], [place].   ________________________________________________________________________

## 2024-01-08 ENCOUNTER — Encounter (HOSPITAL_COMMUNITY)
Admission: RE | Admit: 2024-01-08 | Discharge: 2024-01-08 | Disposition: A | Discharge status: Home / Self Care | Payer: Medicare HMO | Source: Ambulatory Visit | Attending: Orthopedic Surgery | Admitting: Orthopedic Surgery

## 2024-01-08 ENCOUNTER — Other Ambulatory Visit: Payer: Self-pay

## 2024-01-08 ENCOUNTER — Encounter (HOSPITAL_COMMUNITY): Payer: Self-pay

## 2024-01-08 VITALS — BP 120/72 | HR 52 | Temp 97.5°F | Resp 16 | Ht 71.0 in | Wt 250.0 lb

## 2024-01-08 DIAGNOSIS — H6522 Chronic serous otitis media, left ear: Secondary | ICD-10-CM | POA: Diagnosis not present

## 2024-01-08 DIAGNOSIS — Z9622 Myringotomy tube(s) status: Secondary | ICD-10-CM | POA: Diagnosis not present

## 2024-01-08 DIAGNOSIS — Z01818 Encounter for other preprocedural examination: Secondary | ICD-10-CM | POA: Diagnosis not present

## 2024-01-08 DIAGNOSIS — I10 Essential (primary) hypertension: Secondary | ICD-10-CM | POA: Insufficient documentation

## 2024-01-08 DIAGNOSIS — E1121 Type 2 diabetes mellitus with diabetic nephropathy: Secondary | ICD-10-CM | POA: Diagnosis not present

## 2024-01-08 HISTORY — DX: Immune thrombocytopenic purpura: D69.3

## 2024-01-08 HISTORY — DX: Hypoglycemia, unspecified: E16.2

## 2024-01-08 LAB — BASIC METABOLIC PANEL
Anion gap: 5 (ref 5–15)
BUN: 26 mg/dL — ABNORMAL HIGH (ref 8–23)
CO2: 29 mmol/L (ref 22–32)
Calcium: 8.9 mg/dL (ref 8.9–10.3)
Chloride: 104 mmol/L (ref 98–111)
Creatinine, Ser: 0.74 mg/dL (ref 0.61–1.24)
GFR, Estimated: >60 mL/min (ref >60)
Glucose, Bld: 98 mg/dL (ref 70–99)
Potassium: 4.1 mmol/L (ref 3.5–5.1)
Sodium: 138 mmol/L (ref 135–145)

## 2024-01-08 LAB — CBC
HCT: 45.3 % (ref 39.0–52.0)
Hemoglobin: 14.3 g/dL (ref 13.0–17.0)
MCH: 31.2 pg (ref 26.0–34.0)
MCHC: 31.6 g/dL (ref 30.0–36.0)
MCV: 98.9 fL (ref 80.0–100.0)
Platelets: 68 10*3/uL — ABNORMAL LOW (ref 150–400)
RBC: 4.58 MIL/uL (ref 4.22–5.81)
RDW: 13.6 % (ref 11.5–15.5)
WBC: 3.9 10*3/uL — ABNORMAL LOW (ref 4.0–10.5)
nRBC: 0 % (ref 0.0–0.2)

## 2024-01-08 LAB — GLUCOSE, CAPILLARY: Glucose-Capillary: 105 mg/dL — ABNORMAL HIGH (ref 70–99)

## 2024-01-08 LAB — SURGICAL PCR SCREEN
MRSA, PCR: NEGATIVE
Staphylococcus aureus: NEGATIVE

## 2024-01-08 LAB — HEMOGLOBIN A1C
Hgb A1c MFr Bld: 5.3 % (ref 4.8–5.6)
Mean Plasma Glucose: 105.41 mg/dL

## 2024-01-08 NOTE — Progress Notes (Addendum)
Anesthesia Review:MI, CAD, CHF, [place], OSA, ITP, MI 2004, mild to moderate aortic valve stenosis per echo 02-18-23, PLT 68  [place] Cardi[place] : Eldridge Dace 12/04/23- Telephone visit- Emmaline Kluver Clearance   .   Has appt. 01-16-24 with Dr. Armanda Magic because Dr. Eldridge Dace moved out of town  [facility]- 01/17/23   Dr. Arbutus Ped clearance in [place] not dated or signed. LOV 12-04-23 epic  Chest x-ray : EKG :01-08-24 At preop Echo :02-18-23  Stress test:2018 Cardiac Cath :   Activity level: Able to climb a flight of stairs without CP or SOB  Sleep Study/ CPAP :No longer uses Cpap due to weight loss 60lbs  Fasting Blood Sugar :   0   / Checks Blood Sugar -- times a day:   Blood Thinner/ Instructions /Last Dose: ASA / Instructions/ Last Dose :    81 mg aspirin hold 5-7 days   DM- type 2  lost weight no medications Hgba1c-

## 2024-01-14 NOTE — Care Plan (Signed)
Ortho Bundle Case Management Note  Patient Details  Name: Gordon Madden MRN: [id number] Date of Birth: 09/14/1951  spoke with patient. will discharge to home with family to assist. no DME needed. OPPT set up with [facility] discharge instructions resent to him in email. has not recieved the mailed version. states he will ask all of his questions at his preop appointment tomorrow at [facility]. Patient and MD in agreement with plan. Choice offered                     DME Arranged:  [facility] rolling DME Agency:  [employer]  HH Arranged:    HH Agency:     Additional Comments: Please contact me with any questions of if this plan should need to change.  Shauna Hugh,  RN,BSN,MHA,CCM  [facility]edic [facility]ist  [phone] 01/14/2024, 2:03 PM

## 2024-01-15 NOTE — Progress Notes (Unsigned)
Cardiology Office Note:   Date:  01/17/2024  ID:  Gordon Madden, DOB 1951-07-20, MRN [id number] PCP:  Ollen Bowl, MD  [facility] Providers Cardiologist:  Alverda Skeans, MD Referring MD: Ollen Bowl, MD  Chief Complaint/Reason for Referral:  F/U for CAD and cardiomyopathy ASSESSMENT:    1. ACS (acute coronary syndrome) (HCC)   2. Hyperlipidemia LDL goal <55   3. Essential hypertension   4. Nonrheumatic aortic valve stenosis   5. Ischemic cardiomyopathy   6. Diastolic dysfunction   7. BMI 35.0-35.9,adult   8. Retinal artery occlusion, branch, right   9. Preop cardiovascular exam     PLAN:   In order of problems listed above: ACS:  History of remote MI with PCI RCA.  Continue ASA 81mg , coreg 6.25mg  bid, rosuvastatin 20mg , start PRN NTG. Hyperlipidemia:  Check Lp(a) today.  LDL in November was 43 which is at goal of less than 55 given his history of retinal artery occlusion. Hypertension: Blood pressure well-controlled today. Aortic stenosis:  Moderate on most recent echocardiogram.  Patient is asymptomatic.  Continue to monitor. Ischemic cardiomyopathy:  Continue Entrestro 97/103mg  BID, Coreg 6.25mg  bid, and start Jardiance 10mg  qday. Diastolic dysfunction:  Start Jardiance 10mg . Elevated BMI: Continue diet and exercise modification.   Retinal artery occlusion: Continue aspirin and Crestor 20 mg daily. Preoperative cardiovascular examination: The patient can do 4 METS of activity without angina or dyspnea.  He is at low risk for perioperative cardiovascular complications around his right shoulder surgery.            Dispo:  Return in about 1 year (around 01/16/2025).      Medication Adjustments/Labs and Tests Ordered: Current medicines are reviewed at length with the patient today.  Concerns regarding medicines are outlined above.  The following changes have been made:     Labs/tests ordered: Orders Placed This Encounter  Procedures   Lipoprotein A (LPA)     Medication Changes: Meds ordered this encounter  Medications   empagliflozin (JARDIANCE) 10 MG TABS tablet    Sig: Take 1 tablet (10 mg total) by mouth daily before breakfast.    Dispense:  28 tablet    Refill:  0   nitroGLYCERIN (NITROSTAT) 0.4 MG SL tablet    Sig: Place 1 tablet (0.4 mg total) under the tongue every 5 (five) minutes as needed for chest pain (chets pain).    Dispense:  25 tablet    Refill:  6    Current medicines are reviewed at length with the patient today.  The patient does not have concerns regarding medicines.  I spent 38 minutes reviewing all clinical data during and prior to this visit including all relevant imaging studies, laboratories, clinical information from other health systems and prior notes from both Cardiology and other specialties, interviewing the patient, conducting a complete physical examination, and coordinating care in order to formulate a comprehensive and personalized evaluation and treatment plan.   History of Present Illness:      FOCUSED PROBLEM LIST:   CAD CTO RCA cath 2005 [place] Hyperlipidemia Hypertension BMI 36 Bifascicular block 1AVB + LAFB Cardiomyopathy EF 40-45% TTE 2015 EF 35-40% TTE 2018 EF 40-45%, inferoposterior AK, TTE 2024 AS AVA 1.5, MG 13, Vmax 2.5, EF 40-45% TTE 2024 Diastolic dysfunction G1DD, EF 40-98% TTE 2024 BMI 35 Retinal artery occlusion 2012 ITP Off plavix, on ASA  Discussed the use of AI scribe software for clinical note transcription with the patient, who gave verbal consent to proceed.  History of Present Illness        2/25:  The patient returns for routine follow up.  He was last seen in February of 2024 and was doing well.  He was referred for a TTE which demonstrated a relatively unchanged EF, moderate AS, and no MR.  He has a history of coronary artery disease and a myocardial infarction diagnosed retrospectively around 2004. A cardiac catheterization in 2005 revealed an occluded  right coronary artery, but collateral circulation developed, and no stent was placed. He currently experiences no chest pain, palpitations, or lightheadedness.  He has a history of diabetes, which has resolved following significant weight loss of over 60 pounds in the last 20 months. He transitioned from diabetic to prediabetic and is now nondiabetic, attributing his success to Weight Watchers.  He has a history of obstructive sleep apnea but no longer uses CPAP since losing weight. No issues with breathing while sleeping or lying down.  He experienced a stroke approximately 12 to 15 years ago due to a piece of plaque lodging in his retinal artery, causing temporary vision loss in the upper third of his visual field. This was managed by a retina specialist, and the condition improved without surgical intervention.  He has a history of immune thrombocytopenic purpura with platelet counts typically in the mid-60s to mid-70s. He was previously on Plavix but switched to aspirin due to bleeding issues, including epistaxis and easy bruising. Minor bruises resolve, but more significant ones take longer to heal.  He underwent knee surgery in November 2023 and is scheduled for right shoulder replacement surgery soon. He also reports occasional hypoglycemic episodes, which have been monitored with a glucose sensor for the past three months without further incidents.  He has a history of an enlarged prostate and a significant kidney stone, which are currently being monitored by a urologist. No recent hospitalizations or emergency room visits in the last six months.  No chest pain, palpitations, lightheadedness, or blacking out spells. He experiences shortness of breath with exertion, such as walking uphill, but recovers quickly. No swelling in his legs.        Current Medications: Current Meds  Medication Sig   aspirin EC 81 MG tablet Take 1 tablet (81 mg total) by mouth daily. Swallow whole.   carvedilol  (COREG) 6.25 MG tablet TAKE 1 TABLET TWICE DAILY   Coenzyme Q10 (COQ-10 PO) Take 300 mg by mouth daily.   empagliflozin (JARDIANCE) 10 MG TABS tablet Take 1 tablet (10 mg total) by mouth daily before breakfast.   ENTRESTO 97-103 MG TAKE 1 TABLET TWICE DAILY   fexofenadine (ALLEGRA) 180 MG tablet Take 180 mg by mouth daily.   finasteride (PROSCAR) 5 MG tablet Take 5 mg by mouth daily.   folic acid (FOLVITE) 400 MCG tablet Take 400 mcg by mouth daily.   ibuprofen (ADVIL) 200 MG tablet Take 600 mg by mouth 2 (two) times daily as needed for moderate pain (pain score 4-6).   nitroGLYCERIN (NITROSTAT) 0.4 MG SL tablet Place 1 tablet (0.4 mg total) under the tongue every 5 (five) minutes as needed for chest pain (chets pain).   rosuvastatin (CRESTOR) 20 MG tablet TAKE 1 TABLET BY MOUTH DAILY *STOP PRAVASTATIN*   silodosin (RAPAFLO) 8 MG CAPS capsule Take 8 mg by mouth daily.   vitamin B-12 (CYANOCOBALAMIN) 1000 MCG tablet Take 1,000 mcg by mouth daily.     Review of Systems:   Please see the history of present illness.  All other systems reviewed and are negative.     EKGs/Labs/Other Test Reviewed:   EKG:  EKG from February 2024 demonstrates sinus bradycardia with 1AVB, LAFB, and anterolateral MI pattern.  EKG Interpretation Date/Time:    Ventricular Rate:    PR Interval:    QRS Duration:    QT Interval:    QTC Calculation:   R Axis:      Text Interpretation:           Risk Assessment/Calculations:          Physical Exam:   VS:  BP 118/66   Pulse 60   Ht 5' 10.5" (1.791 m)   Wt 250 lb 6.4 oz (113.6 kg)   SpO2 94%   BMI 35.42 kg/m        Wt Readings from Last 3 Encounters:  01/17/24 250 lb 6.4 oz (113.6 kg)  01/08/24 250 lb (113.4 kg)  12/05/23 255 lb 4.8 oz (115.8 kg)      GENERAL:  No apparent distress, AOx3 HEENT:  No carotid bruits, +2 carotid impulses, no scleral icterus CAR: RRR with 2/6 SEM RES:  Clear to auscultation bilaterally ABD:  Soft, nontender,  nondistended, positive bowel sounds x 4 VASC:  +2 radial pulses, +2 carotid pulses NEURO:  CN 2-12 grossly intact; motor and sensory grossly intact PSYCH:  No active depression or anxiety EXT:  No edema, ecchymosis, or cyanosis  Signed, Orbie Pyo, MD  01/17/2024 11:11 AM    [facility] [address] [place], [place], [place]  [address] Phone: [phone]; Fax: [phone]   Note:  This document was prepared using Dragon voice recognition software and may include unintentional dictation errors.

## 2024-01-16 ENCOUNTER — Ambulatory Visit: Payer: Medicare HMO | Admitting: Cardiology

## 2024-01-17 ENCOUNTER — Ambulatory Visit: Payer: Medicare HMO | Attending: Internal Medicine | Admitting: Internal Medicine

## 2024-01-17 ENCOUNTER — Encounter: Payer: Self-pay | Admitting: Internal Medicine

## 2024-01-17 VITALS — BP 118/66 | HR 60 | Ht 70.5 in | Wt 250.4 lb

## 2024-01-17 DIAGNOSIS — I350 Nonrheumatic aortic (valve) stenosis: Secondary | ICD-10-CM

## 2024-01-17 DIAGNOSIS — I255 Ischemic cardiomyopathy: Secondary | ICD-10-CM

## 2024-01-17 DIAGNOSIS — H34231 Retinal artery branch occlusion, right eye: Secondary | ICD-10-CM

## 2024-01-17 DIAGNOSIS — I249 Acute ischemic heart disease, unspecified: Secondary | ICD-10-CM | POA: Diagnosis not present

## 2024-01-17 DIAGNOSIS — Z01810 Encounter for preprocedural cardiovascular examination: Secondary | ICD-10-CM

## 2024-01-17 DIAGNOSIS — E118 Type 2 diabetes mellitus with unspecified complications: Secondary | ICD-10-CM

## 2024-01-17 DIAGNOSIS — I5189 Other ill-defined heart diseases: Secondary | ICD-10-CM

## 2024-01-17 DIAGNOSIS — Z6835 Body mass index (BMI) 35.0-35.9, adult: Secondary | ICD-10-CM

## 2024-01-17 DIAGNOSIS — E785 Hyperlipidemia, unspecified: Secondary | ICD-10-CM

## 2024-01-17 DIAGNOSIS — I10 Essential (primary) hypertension: Secondary | ICD-10-CM

## 2024-01-17 DIAGNOSIS — I2510 Atherosclerotic heart disease of native coronary artery without angina pectoris: Secondary | ICD-10-CM | POA: Diagnosis not present

## 2024-01-17 MED ORDER — EMPAGLIFLOZIN 10 MG PO TABS: 10.0000 mg | ORAL_TABLET | Freq: Every day | ORAL | 3 refills | Status: AC

## 2024-01-17 MED ORDER — NITROGLYCERIN 0.4 MG SL SUBL: 0.4000 mg | SUBLINGUAL_TABLET | SUBLINGUAL | 6 refills | Status: AC | PRN

## 2024-01-17 MED ORDER — EMPAGLIFLOZIN 10 MG PO TABS: 10.0000 mg | ORAL_TABLET | Freq: Every day | ORAL | 0 refills | Status: DC

## 2024-01-17 NOTE — Addendum Note (Signed)
Addended by: Franchot Gallo on: 01/17/2024 11:15 AM   Modules accepted: Orders

## 2024-01-17 NOTE — Progress Notes (Addendum)
Anesthesia Chart Review   Case: [id number] Date/Time: 01/21/24 1330   Procedure: TOTAL SHOULDER ARTHROPLASTY (Right: Shoulder)   Anesthesia type: General   Pre-op diagnosis: right shoulder OA   Location: [facility] ROOM 08 / [facility]   Surgeons: Teryl Lucy, MD       DISCUSSION:73 y.o. 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 Teryl Lucy.   Pt seen by cardiology 01/17/2024. Per OV note, "Preoperative cardiovascular examination: The patient can do 4 METS of activity without angina or dyspnea. He is at low risk for perioperative cardiovascular complications around his right shoulder surgery. "   Discussed with Dr. Charlynn Grimes, Echo within the last year, no need for repeat before proceeding with total shoulder.   Pt follows with hem/onc.  Last seen 12/05/2023. Stable at this visit, Platelet count stable at 73,000 with no new symptoms.  VS: BP 120/72   Pulse (!) 52   Temp (!) 36.4 C (Oral)   Resp 16   Ht 5\' 11"  (1.803 m)   Wt 113.4 kg   SpO2 99%   BMI 34.87 kg/m   PROVIDERS: Pahwani, Kasandra Knudsen, MD is PCP    LABS: Labs reviewed: Acceptable for surgery. (all labs ordered are listed, but only abnormal results are displayed)  Labs Reviewed  BASIC METABOLIC PANEL - Abnormal; Notable for the following components:      Result Value   BUN 26 (*)    All other components within normal limits  CBC - Abnormal; Notable for the following components:   WBC 3.9 (*)    Platelets 68 (*)    All other components within normal limits  GLUCOSE, CAPILLARY - Abnormal; Notable for the following components:   Glucose-Capillary 105 (*)    All other components within normal limits  SURGICAL PCR SCREEN  HEMOGLOBIN A1C     IMAGES:   EKG:   CV: Echo  02/18/2023  1. Left ventricular ejection fraction, by estimation, is 40 to 45%. The  left ventricle has mildly decreased function. The left ventricle  demonstrates regional wall motion abnormalities (see scoring  diagram/findings for description). There is mild left  ventricular hypertrophy. Left ventricular diastolic parameters are  consistent with Grade I diastolic dysfunction (impaired relaxation).   2. Right ventricular systolic function is normal. The right ventricular  size is mildly enlarged. Tricuspid regurgitation signal is inadequate for  assessing PA pressure.   3. Right atrial size was mildly dilated.   4. The mitral valve is normal in structure. Mild mitral valve  regurgitation. No evidence of mitral stenosis.   5. The aortic valve is calcified. There is moderate calcification of the  aortic valve. Aortic valve regurgitation is mild. Mild to moderate aortic  valve stenosis. Vmax 2.5 m/s, MG , AVA 1.5cm^2, DI 0.32   6. Aortic dilatation noted. There is dilatation of the ascending aorta,  measuring 43 mm.  Past Medical History:  Diagnosis Date   Arthritis    Ascending aorta dilatation (HCC)    BPH (benign prostatic hyperplasia)    CAD (coronary artery disease)    a. MI 2004 with occ RCA.   Cardiomyopathy    EF 40%   Chronic ITP (idiopathic thrombocytopenia) (HCC)    Chronic systolic CHF (congestive heart failure) (HCC)  Colon polyps    CPAP (continuous positive airway pressure) dependence    Diabetes mellitus, type 2 (HCC)    no meds due to weiht loss   Heart murmur    02-18-23  echo in epic   History of gout    History of kidney stones    Hyperlipidemia    Hypertension    Hypoglycemia    since weight loss wears dexcom to monitor and has been regulating per pt.   Idiopathic thrombocytopenia (HCC)    Morbid obesity (HCC)    Myocardial infarction (HCC)    OSA (obstructive sleep apnea)    07/25/10 AHI 14.9/hr   no cpap due to weight loss   Pneumonia     Primary localized osteoarthritis of left knee 06/07/2015   Primary localized osteoarthritis of right knee 08/06/2017   Rhinosinusitis    Shortness of breath dyspnea    Stroke [facility])    right amaurosis fugax, concern for retinal artery occlusion 12/2009; cerebral arteriography 01/30/10: no occlusion, stenoses, dissections, aneurysms, AVM noted, patent opthalmic arteries   Vision decreased     Past Surgical History:  Procedure Laterality Date   CARDIAC CATHETERIZATION  2005   no stents   CHOLECYSTECTOMY N/A 02/02/2021   Procedure: LAPAROSCOPIC CHOLECYSTECTOMY;  Surgeon: Fritzi Mandes, MD;  Location: [facility];  Service: General;  Laterality: N/A;   CYST EXCISION     chest and back   HERNIA REPAIR     umbilical   JOINT REPLACEMENT     Left 06/07/15   KNEE CARTILAGE SURGERY     left   KNEE SURGERY Right 08/06/2017   MENISCUS REPAIR Left    NASAL SINUS SURGERY     PARTIAL KNEE ARTHROPLASTY Right 08/06/2017   Procedure: UNICOMPARTMENTAL KNEE;  Surgeon: Teryl Lucy, MD;  Location: [facility];  Service: Orthopedics;  Laterality: Right;   SINUS IRRIGATION     TOTAL KNEE ARTHROPLASTY Left 06/07/2015   Procedure: TOTAL KNEE ARTHROPLASTY;  Surgeon: Teryl Lucy, MD;  Location: [facility];  Service: Orthopedics;  Laterality: Left;   TOTAL KNEE ARTHROPLASTY WITH REVISION COMPONENTS Right 10/23/2022   Procedure: TOTAL KNEE ARTHROPLASTY WITH REVISION COMPONENTS;  Surgeon: Teryl Lucy, MD;  Location[facility];  Service: Orthopedics;  Laterality: Right;   UMBILICAL HERNIA REPAIR      MEDICATIONS:  aspirin EC 81 MG tablet   carvedilol (COREG) 6.25 MG tablet   Coenzyme Q10 (COQ-10 PO)   empagliflozin (JARDIANCE) 10 MG TABS tablet   empagliflozin (JARDIANCE) 10 MG TABS tablet   ENTRESTO 97-103 MG   fexofenadine (ALLEGRA) 180 MG tablet   finasteride (PROSCAR) 5 MG tablet   folic acid (FOLVITE) 400 MCG tablet   ibuprofen (ADVIL) 200 MG tablet   nitroGLYCERIN (NITROSTAT) 0.4 MG SL tablet   rosuvastatin  (CRESTOR) 20 MG tablet   silodosin (RAPAFLO) 8 MG CAPS capsule   vitamin B-12 (CYANOCOBALAMIN) 1000 MCG tablet   No current facility-administered medications for this encounter.    Jodell Cipro Ward, PA-C [facility] [phone]

## 2024-01-17 NOTE — Anesthesia Preprocedure Evaluation (Addendum)
 Anesthesia Evaluation  Patient identified by MRN, date of birth, ID band Patient awake    Reviewed: Allergy & Precautions, NPO status , Patient's Chart, lab work & pertinent test results, reviewed documented beta blocker date and time   History of Anesthesia Complications Negative for: history of anesthetic complications  Airway Mallampati: I  TM Distance: >3 FB Neck ROM: Full    Dental no notable dental hx.    Pulmonary shortness of breath, sleep apnea , pneumonia, Current Smoker   breath sounds clear to auscultation       Cardiovascular hypertension, + CAD, + Past MI, +CHF and + DOE  (-) Cardiac Stents and (-) CABG + Valvular Problems/Murmurs  Rhythm:Regular Rate:Normal  LVEF 40-45%, mod AS, mild MR   Neuro/Psych neg Seizures CVA, No Residual Symptoms    GI/Hepatic   Endo/Other  diabetes    Renal/GU Renal disease     Musculoskeletal  (+) Arthritis ,    Abdominal   Peds  Hematology  (+) Blood dyscrasia, anemia Chronic thrombocytopenia   Anesthesia Other Findings   Reproductive/Obstetrics                              Anesthesia Physical Anesthesia Plan  ASA: 3  Anesthesia Plan: General   Post-op Pain Management: Regional block*   Induction: Intravenous  PONV Risk Score and Plan: 2 and Ondansetron and Dexamethasone  Airway Management Planned: Oral ETT  Additional Equipment:   Intra-op Plan:   Post-operative Plan: Extubation in OR  Informed Consent: I have reviewed the patients History and Physical, chart, labs and discussed the procedure including the risks, benefits and alternatives for the proposed anesthesia with the patient or authorized representative who has indicated his/her understanding and acceptance.     Dental advisory given  Plan Discussed with: CRNA  Anesthesia Plan Comments: (See PAT note 01/08/2024)        Anesthesia Quick Evaluation

## 2024-01-17 NOTE — Patient Instructions (Signed)
Medication Instructions:  Your physician has recommended you make the following change in your medication:   1) START empagliflozin (Jardiance) 10 mg daily 2) START nitroglycerin Dissolve 1 tablet under the tongue every 5 minutes as needed for chest pain. Max of 3 doses, then 911.   *If you need a refill on your cardiac medications before your next appointment, please call your pharmacy*  Lab Work: TODAY: LP (a) If you have labs (blood work) drawn today and your tests are completely normal, you will receive your results only by: MyChart Message (if you have MyChart) OR A paper copy in the mail If you have any lab test that is abnormal or we need to change your treatment, we will call you to review the results.  Follow-Up: At [facility], you and your health needs are our priority.  As part of our continuing mission to provide you with exceptional heart care, we have created designated Provider Care Teams.  These Care Teams include your primary Cardiologist (physician) and Advanced Practice Providers (APPs -  Physician Assistants and Nurse Practitioners) who all work together to provide you with the care you need, when you need it.  Your next appointment:   1 year(s)  The format for your next appointment:   In Person  Provider:   Orbie Pyo, MD {  Other Instructions   1st Floor: - Lobby - Registration  - Pharmacy  - Lab - Cafe  2nd Floor: - PV Lab - Diagnostic Testing (echo, CT, nuclear med)  3rd Floor: - Vacant  4th Floor: - TCTS (cardiothoracic surgery) - AFib Clinic - Structural Heart Clinic - Vascular Surgery  - Vascular Ultrasound  5th Floor: - [facility] (general and EP) - Clinical Pharmacy for coumadin, hypertension, lipid, weight-loss medications, and med management appointments    Valet parking services will be available as well.

## 2024-01-20 NOTE — H&P (Signed)
 PREOPERATIVE H&P  Chief Complaint: Right shoulder pain  HPI: Gordon Madden is a 73 y.o. male who presents for preoperative history and physical with a diagnosis of right shoulder osteoarthritis. Symptoms are rated as moderate to severe, and have been worsening.  This is significantly impairing activities of daily living.  He has elected for surgical management.   He has failed injections, activity modification, anti-inflammatories, and assistive devices.  Preoperative X-rays demonstrate end stage degenerative changes with osteophyte formation, loss of joint space, subchondral sclerosis.   Past Medical History:  Diagnosis Date   Arthritis    Ascending aorta dilatation (HCC)    BPH (benign prostatic hyperplasia)    CAD (coronary artery disease)    a. MI 2004 with occ RCA.   Cardiomyopathy    EF 40%   Chronic ITP (idiopathic thrombocytopenia) (HCC)    Chronic systolic CHF (congestive heart failure) (HCC)    Colon polyps    CPAP (continuous positive airway pressure) dependence    Diabetes mellitus, type 2 (HCC)    no meds due to weiht loss   Heart murmur    02-18-23  echo in epic   History of gout    History of kidney stones    Hyperlipidemia    Hypertension    Hypoglycemia    since weight loss wears dexcom to monitor and has been regulating per pt.   Idiopathic thrombocytopenia (HCC)    Morbid obesity (HCC)    Myocardial infarction (HCC)    OSA (obstructive sleep apnea)    07/25/10 AHI 14.9/hr   no cpap due to weight loss   Pneumonia    Primary localized osteoarthritis of left knee 06/07/2015   Primary localized osteoarthritis of right knee 08/06/2017   Rhinosinusitis    Shortness of breath dyspnea    Stroke [facility])    right amaurosis fugax, concern for retinal artery occlusion 12/2009; cerebral arteriography 01/30/10: no occlusion, stenoses, dissections, aneurysms, AVM noted, patent opthalmic arteries   Vision decreased    Past Surgical History:  Procedure Laterality Date    CARDIAC CATHETERIZATION  2005   no stents   CHOLECYSTECTOMY N/A 02/02/2021   Procedure: LAPAROSCOPIC CHOLECYSTECTOMY;  Surgeon: Fritzi Mandes, MD;  Location[facility];  Service: General;  Laterality: N/A;   CYST EXCISION     chest and back   HERNIA REPAIR     umbilical   JOINT REPLACEMENT     Left 06/07/15   KNEE CARTILAGE SURGERY     left   KNEE SURGERY Right 08/06/2017   MENISCUS REPAIR Left    NASAL SINUS SURGERY     PARTIAL KNEE ARTHROPLASTY Right 08/06/2017   Procedure: UNICOMPARTMENTAL KNEE;  Surgeon: Teryl Lucy, MD;  Location: [facility];  Service: Orthopedics;  Laterality: Right;   SINUS IRRIGATION     TOTAL KNEE ARTHROPLASTY Left 06/07/2015   Procedure: TOTAL KNEE ARTHROPLASTY;  Surgeon: Teryl Lucy, MD;  Location[facility];  Service: Orthopedics;  Laterality: Left;   TOTAL KNEE ARTHROPLASTY WITH REVISION COMPONENTS Right 10/23/2022   Procedure: TOTAL KNEE ARTHROPLASTY WITH REVISION COMPONENTS;  Surgeon: Teryl Lucy, MD;  Location[facility];  Service: Orthopedics;  Laterality: Right;   UMBILICAL HERNIA REPAIR     Social History   Socioeconomic History   Marital status: Married    Spouse name: Not on file   Number of children: Not on file   Years of education: Not on file   Highest education level: Not on file  Occupational History   Occupation:  Pricing Coordinator    Employer: [employer]  Tobacco Use   Smoking status: Every Day    Types: Cigars   Smokeless tobacco: Never   Tobacco comments:    stopped smoking cigarettes in 1991, smokes 1 cigar per day  Vaping Use   Vaping status: Never Used  Substance and Sexual Activity   Alcohol use: Yes    Alcohol/week: 1.0 standard drink of alcohol    Types: 1 Standard drinks or equivalent per week    Comment: occassionally   Drug use: No   Sexual activity: Not Currently  Other Topics Concern   Not on file  Social History Narrative   ** Merged History Encounter **       Social Drivers of Health    Financial Resource Strain: Not on file  Food Insecurity: No Food Insecurity (10/23/2022)   Hunger Vital Sign    Worried About Running Out of Food in the Last Year: Never true    Ran Out of Food in the Last Year: Never true  Transportation Needs: No Transportation Needs (10/23/2022)   PRAPARE - Administrator, Civil Service (Medical): No    Lack of Transportation (Non-Medical): No  Physical Activity: Not on file  Stress: Not on file  Social Connections: Not on file   Family History  Problem Relation Age of Onset   Hypertension Father    Colon cancer Neg Hx    Heart attack Neg Hx    Allergies  Allergen Reactions   Plavix [Clopidogrel] Other (See Comments)    Thrombocytopenia-plavix causes low platelets but pt's MD monitors pt closely, and pt does NOT take plavix.    Prior to Admission medications   Medication Sig Start Date End Date Taking? Authorizing Provider  aspirin EC 81 MG tablet Take 1 tablet (81 mg total) by mouth daily. Swallow whole. 04/30/23  Yes Corky Crafts, MD  carvedilol (COREG) 6.25 MG tablet TAKE 1 TABLET TWICE DAILY 08/01/23  Yes Corky Crafts, MD  Coenzyme Q10 (COQ-10 PO) Take 300 mg by mouth daily.   Yes [provider]  ENTRESTO 97-103 MG TAKE 1 TABLET TWICE DAILY 01/23/23  Yes Corky Crafts, MD  fexofenadine (ALLEGRA) 180 MG tablet Take 180 mg by mouth daily.   Yes [provider]  finasteride (PROSCAR) 5 MG tablet Take 5 mg by mouth daily. 06/16/22  Yes [provider]  folic acid (FOLVITE) 400 MCG tablet Take 400 mcg by mouth daily.   Yes [provider]  ibuprofen (ADVIL) 200 MG tablet Take 600 mg by mouth 2 (two) times daily as needed for moderate pain (pain score 4-6).   Yes [provider]  rosuvastatin (CRESTOR) 20 MG tablet TAKE 1 TABLET BY MOUTH DAILY *STOP PRAVASTATIN* 11/11/23  Yes Turner, Cornelious Bryant, MD  silodosin (RAPAFLO) 8 MG CAPS capsule Take 8 mg by mouth daily. 04/25/22   Yes [provider]  vitamin B-12 (CYANOCOBALAMIN) 1000 MCG tablet Take 1,000 mcg by mouth daily.   Yes [provider]  empagliflozin (JARDIANCE) 10 MG TABS tablet Take 1 tablet (10 mg total) by mouth daily before breakfast. 01/17/24   Orbie Pyo, MD  empagliflozin (JARDIANCE) 10 MG TABS tablet Take 1 tablet (10 mg total) by mouth daily before breakfast. 01/17/24   Orbie Pyo, MD  nitroGLYCERIN (NITROSTAT) 0.4 MG SL tablet Place 1 tablet (0.4 mg total) under the tongue every 5 (five) minutes as needed for chest pain (chets pain). 01/17/24  Orbie Pyo, MD     Positive ROS: All other systems have been reviewed and were otherwise negative with the exception of those mentioned in the HPI and as above.  Physical Exam: General: Alert, no acute distress Cardiovascular: No pedal edema Respiratory: No cyanosis, no use of accessory musculature GI: No organomegaly, abdomen is soft and non-tender Skin: No lesions in the area of chief complaint Neurologic: Sensation intact distally Psychiatric: Patient is competent for consent with normal mood and affect Lymphatic: No axillary or cervical lymphadenopathy  MUSCULOSKELETAL: Right shoulder active motion 0 to 160 degrees with crepitance and painful arc of motion.  He has almost no internal rotation.  Assessment: Right shoulder osteoarthritis   Plan: Plan for Procedure(s): TOTAL SHOULDER ARTHROPLASTY  The risks benefits and alternatives were discussed with the patient including but not limited to the risks of nonoperative treatment, versus surgical intervention including infection, bleeding, nerve injury,  blood clots, cardiopulmonary complications, morbidity, mortality, among others, and they were willing to proceed.    Patient's anticipated LOS is less than 2 midnights, meeting these requirements: - Younger than 59 - Lives within 1 hour of care - Has a competent adult at home to recover with post-op recover -  NO history of  - Chronic pain requiring opiods  - Diabetes  - Coronary Artery Disease  - Heart failure  - Heart attack  - Stroke  - DVT/VTE  - Cardiac arrhythmia  - Respiratory Failure/COPD  - Renal failure  - Anemia  - Advanced Liver disease      Eulas Post, MD Cell [phone]   01/20/2024 2:14 PM

## 2024-01-21 ENCOUNTER — Ambulatory Visit (HOSPITAL_BASED_OUTPATIENT_CLINIC_OR_DEPARTMENT_OTHER): Payer: Self-pay | Admitting: Anesthesiology

## 2024-01-21 ENCOUNTER — Other Ambulatory Visit: Payer: Self-pay

## 2024-01-21 ENCOUNTER — Ambulatory Visit (HOSPITAL_COMMUNITY)
Admission: RE | Admit: 2024-01-21 | Discharge: 2024-01-21 | Disposition: A | Discharge status: Home / Self Care | Payer: Medicare HMO | Attending: Orthopedic Surgery | Admitting: Orthopedic Surgery

## 2024-01-21 ENCOUNTER — Encounter: Payer: Self-pay | Admitting: Internal Medicine

## 2024-01-21 ENCOUNTER — Encounter (HOSPITAL_COMMUNITY): Payer: Self-pay | Admitting: Orthopedic Surgery

## 2024-01-21 ENCOUNTER — Ambulatory Visit (HOSPITAL_COMMUNITY): Payer: Medicare HMO

## 2024-01-21 ENCOUNTER — Encounter (HOSPITAL_COMMUNITY)
Admission: RE | Disposition: A | Discharge status: Home / Self Care | Payer: Self-pay | Source: Home / Self Care | Attending: Orthopedic Surgery

## 2024-01-21 ENCOUNTER — Ambulatory Visit (HOSPITAL_COMMUNITY): Payer: Medicare HMO | Admitting: Physician Assistant

## 2024-01-21 DIAGNOSIS — I5022 Chronic systolic (congestive) heart failure: Secondary | ICD-10-CM | POA: Diagnosis not present

## 2024-01-21 DIAGNOSIS — Z96611 Presence of right artificial shoulder joint: Secondary | ICD-10-CM | POA: Diagnosis not present

## 2024-01-21 DIAGNOSIS — G4733 Obstructive sleep apnea (adult) (pediatric): Secondary | ICD-10-CM | POA: Insufficient documentation

## 2024-01-21 DIAGNOSIS — I509 Heart failure, unspecified: Secondary | ICD-10-CM

## 2024-01-21 DIAGNOSIS — Z8673 Personal history of transient ischemic attack (TIA), and cerebral infarction without residual deficits: Secondary | ICD-10-CM | POA: Insufficient documentation

## 2024-01-21 DIAGNOSIS — F17290 Nicotine dependence, other tobacco product, uncomplicated: Secondary | ICD-10-CM | POA: Diagnosis not present

## 2024-01-21 DIAGNOSIS — M19012 Primary osteoarthritis, left shoulder: Secondary | ICD-10-CM | POA: Diagnosis not present

## 2024-01-21 DIAGNOSIS — E1122 Type 2 diabetes mellitus with diabetic chronic kidney disease: Secondary | ICD-10-CM | POA: Diagnosis not present

## 2024-01-21 DIAGNOSIS — I2510 Atherosclerotic heart disease of native coronary artery without angina pectoris: Secondary | ICD-10-CM | POA: Insufficient documentation

## 2024-01-21 DIAGNOSIS — Z7984 Long term (current) use of oral hypoglycemic drugs: Secondary | ICD-10-CM | POA: Insufficient documentation

## 2024-01-21 DIAGNOSIS — N181 Chronic kidney disease, stage 1: Secondary | ICD-10-CM | POA: Diagnosis not present

## 2024-01-21 DIAGNOSIS — M19011 Primary osteoarthritis, right shoulder: Secondary | ICD-10-CM | POA: Insufficient documentation

## 2024-01-21 DIAGNOSIS — E1121 Type 2 diabetes mellitus with diabetic nephropathy: Secondary | ICD-10-CM

## 2024-01-21 DIAGNOSIS — I110 Hypertensive heart disease with heart failure: Secondary | ICD-10-CM

## 2024-01-21 DIAGNOSIS — I252 Old myocardial infarction: Secondary | ICD-10-CM | POA: Insufficient documentation

## 2024-01-21 DIAGNOSIS — R011 Cardiac murmur, unspecified: Secondary | ICD-10-CM | POA: Insufficient documentation

## 2024-01-21 DIAGNOSIS — D693 Immune thrombocytopenic purpura: Secondary | ICD-10-CM | POA: Diagnosis not present

## 2024-01-21 DIAGNOSIS — M25711 Osteophyte, right shoulder: Secondary | ICD-10-CM | POA: Insufficient documentation

## 2024-01-21 DIAGNOSIS — G8918 Other acute postprocedural pain: Secondary | ICD-10-CM | POA: Diagnosis not present

## 2024-01-21 DIAGNOSIS — E119 Type 2 diabetes mellitus without complications: Secondary | ICD-10-CM | POA: Diagnosis not present

## 2024-01-21 DIAGNOSIS — I130 Hypertensive heart and chronic kidney disease with heart failure and stage 1 through stage 4 chronic kidney disease, or unspecified chronic kidney disease: Secondary | ICD-10-CM | POA: Diagnosis not present

## 2024-01-21 HISTORY — PX: TOTAL SHOULDER ARTHROPLASTY: SHX126

## 2024-01-21 HISTORY — DX: Primary osteoarthritis, right shoulder: M19.011

## 2024-01-21 LAB — TYPE AND SCREEN
ABO/RH(D): A POS
Antibody Screen: NEGATIVE

## 2024-01-21 LAB — CBC
HCT: 47 % (ref 39.0–52.0)
Hemoglobin: 15.4 g/dL (ref 13.0–17.0)
MCH: 31.8 pg (ref 26.0–34.0)
MCHC: 32.8 g/dL (ref 30.0–36.0)
MCV: 97.1 fL (ref 80.0–100.0)
Platelets: 81 10*3/uL — ABNORMAL LOW (ref 150–400)
RBC: 4.84 MIL/uL (ref 4.22–5.81)
RDW: 13.3 % (ref 11.5–15.5)
WBC: 5.2 10*3/uL (ref 4.0–10.5)
nRBC: 0 % (ref 0.0–0.2)

## 2024-01-21 LAB — LIPOPROTEIN A (LPA): Lipoprotein (a): <8.4 nmol/L (ref <75.0)

## 2024-01-21 SURGERY — ARTHROPLASTY, SHOULDER, TOTAL
Anesthesia: General | Site: Shoulder | Laterality: Right

## 2024-01-21 MED ORDER — BACLOFEN 10 MG PO TABS: 10.0000 mg | ORAL_TABLET | Freq: Three times a day (TID) | ORAL | 0 refills | Status: DC

## 2024-01-21 MED ORDER — ATROPINE SULFATE 0.4 MG/ML IV SOLN
INTRAVENOUS | Status: DC | PRN
Start: 2024-01-21 — End: 2024-01-21
  Administered 2024-01-21: .4 mg via INTRAVENOUS

## 2024-01-21 MED ORDER — ONDANSETRON HCL 4 MG/2ML IJ SOLN
INTRAMUSCULAR | Status: DC | PRN
  Administered 2024-01-21: 4 mg via INTRAVENOUS

## 2024-01-21 MED ORDER — LIDOCAINE HCL (PF) 2 % IJ SOLN
INTRAMUSCULAR | Status: DC | PRN
  Administered 2024-01-21: 40 mg via INTRADERMAL

## 2024-01-21 MED ORDER — ORAL CARE MOUTH RINSE: 15.0000 mL | Freq: Once | OROMUCOSAL | Status: AC

## 2024-01-21 MED ORDER — MIDAZOLAM HCL 2 MG/2ML IJ SOLN: 1.0000 mg | INTRAMUSCULAR | Status: DC | PRN

## 2024-01-21 MED ORDER — 0.9 % SODIUM CHLORIDE (POUR BTL) OPTIME
TOPICAL | Status: DC | PRN
  Administered 2024-01-21: 1000 mL

## 2024-01-21 MED ORDER — SUGAMMADEX SODIUM 200 MG/2ML IV SOLN
INTRAVENOUS | Status: DC | PRN
  Administered 2024-01-21: 400 mg via INTRAVENOUS

## 2024-01-21 MED ORDER — FENTANYL CITRATE (PF) 100 MCG/2ML IJ SOLN
INTRAMUSCULAR | Status: DC | PRN
  Administered 2024-01-21: 100 ug via INTRAVENOUS

## 2024-01-21 MED ORDER — ONDANSETRON HCL 4 MG PO TABS: 4.0000 mg | ORAL_TABLET | Freq: Three times a day (TID) | ORAL | 0 refills | Status: DC | PRN

## 2024-01-21 MED ORDER — PHENYLEPHRINE HCL-NACL 20-0.9 MG/250ML-% IV SOLN
INTRAVENOUS | Status: DC | PRN
  Administered 2024-01-21: 50 ug/min via INTRAVENOUS

## 2024-01-21 MED ORDER — TRANEXAMIC ACID-NACL 1000-0.7 MG/100ML-% IV SOLN: 1000.0000 mg | Freq: Once | INTRAVENOUS | Status: DC

## 2024-01-21 MED ORDER — OXYCODONE HCL 5 MG PO TABS
5.0000 mg | ORAL_TABLET | Freq: Once | ORAL | Status: AC | PRN
  Administered 2024-01-21: 5 mg via ORAL

## 2024-01-21 MED ORDER — FENTANYL CITRATE PF 50 MCG/ML IJ SOSY: 25.0000 ug | PREFILLED_SYRINGE | INTRAMUSCULAR | Status: DC | PRN

## 2024-01-21 MED ORDER — SENNA-DOCUSATE SODIUM 8.6-50 MG PO TABS: 2.0000 | ORAL_TABLET | Freq: Every day | ORAL | 1 refills | Status: DC

## 2024-01-21 MED ORDER — EPHEDRINE SULFATE-NACL 50-0.9 MG/10ML-% IV SOSY
PREFILLED_SYRINGE | INTRAVENOUS | Status: DC | PRN
  Administered 2024-01-21: 10 mg via INTRAVENOUS

## 2024-01-21 MED ORDER — OXYCODONE HCL 5 MG/5ML PO SOLN: 5.0000 mg | Freq: Once | ORAL | Status: AC | PRN

## 2024-01-21 MED ORDER — CEFAZOLIN SODIUM-DEXTROSE 2-4 GM/100ML-% IV SOLN
2.0000 g | INTRAVENOUS | Status: AC
  Administered 2024-01-21: 2 g via INTRAVENOUS
  Filled 2024-01-21: qty 100

## 2024-01-21 MED ORDER — PHENYLEPHRINE 80 MCG/ML (10ML) SYRINGE FOR IV PUSH (FOR BLOOD PRESSURE SUPPORT)
PREFILLED_SYRINGE | INTRAVENOUS | Status: AC
  Filled 2024-01-21: qty 20

## 2024-01-21 MED ORDER — OXYCODONE HCL 5 MG PO TABS: 5.0000 mg | ORAL_TABLET | ORAL | 0 refills | Status: DC | PRN

## 2024-01-21 MED ORDER — ROCURONIUM BROMIDE 10 MG/ML (PF) SYRINGE
PREFILLED_SYRINGE | INTRAVENOUS | Status: DC | PRN
  Administered 2024-01-21: 80 mg via INTRAVENOUS
  Administered 2024-01-21: 30 mg via INTRAVENOUS
  Administered 2024-01-21 (×2): 20 mg via INTRAVENOUS

## 2024-01-21 MED ORDER — ONDANSETRON HCL 4 MG/2ML IJ SOLN: 4.0000 mg | Freq: Once | INTRAMUSCULAR | Status: DC | PRN

## 2024-01-21 MED ORDER — LIDOCAINE HCL (PF) 2 % IJ SOLN
INTRAMUSCULAR | Status: AC
Start: 2024-01-21 — End: ?
  Filled 2024-01-21: qty 5

## 2024-01-21 MED ORDER — LACTATED RINGERS IV SOLN: INTRAVENOUS | Status: DC

## 2024-01-21 MED ORDER — POVIDONE-IODINE 10 % EX SWAB
2.0000 | Freq: Once | CUTANEOUS | Status: AC
  Administered 2024-01-21: 2 via TOPICAL

## 2024-01-21 MED ORDER — PHENYLEPHRINE 80 MCG/ML (10ML) SYRINGE FOR IV PUSH (FOR BLOOD PRESSURE SUPPORT)
PREFILLED_SYRINGE | INTRAVENOUS | Status: DC | PRN
  Administered 2024-01-21: 160 ug via INTRAVENOUS

## 2024-01-21 MED ORDER — STERILE WATER FOR IRRIGATION IR SOLN
Status: DC | PRN
  Administered 2024-01-21: 1000 mL

## 2024-01-21 MED ORDER — CHLORHEXIDINE GLUCONATE 0.12 % MT SOLN
15.0000 mL | Freq: Once | OROMUCOSAL | Status: AC
  Administered 2024-01-21: 15 mL via OROMUCOSAL

## 2024-01-21 MED ORDER — SODIUM CHLORIDE 0.9 % IR SOLN
Status: DC | PRN
  Administered 2024-01-21: 1000 mL

## 2024-01-21 MED ORDER — TRANEXAMIC ACID-NACL 1000-0.7 MG/100ML-% IV SOLN
INTRAVENOUS | Status: DC | PRN
  Administered 2024-01-21: 1000 mg via INTRAVENOUS

## 2024-01-21 MED ORDER — ROCURONIUM BROMIDE 10 MG/ML (PF) SYRINGE
PREFILLED_SYRINGE | INTRAVENOUS | Status: AC
  Filled 2024-01-21: qty 10

## 2024-01-21 MED ORDER — TRANEXAMIC ACID-NACL 1000-0.7 MG/100ML-% IV SOLN
INTRAVENOUS | Status: AC
  Filled 2024-01-21: qty 100

## 2024-01-21 MED ORDER — BUPIVACAINE HCL (PF) 0.25 % IJ SOLN
INTRAMUSCULAR | Status: AC
  Filled 2024-01-21: qty 30

## 2024-01-21 MED ORDER — ONDANSETRON HCL 4 MG/2ML IJ SOLN
INTRAMUSCULAR | Status: AC
Start: 2024-01-21 — End: ?
  Filled 2024-01-21: qty 2

## 2024-01-21 MED ORDER — ALBUMIN HUMAN 5 % IV SOLN: INTRAVENOUS | Status: DC | PRN

## 2024-01-21 MED ORDER — PROPOFOL 10 MG/ML IV BOLUS
INTRAVENOUS | Status: AC
  Filled 2024-01-21: qty 20

## 2024-01-21 MED ORDER — ACETAMINOPHEN 500 MG PO TABS
1000.0000 mg | ORAL_TABLET | Freq: Once | ORAL | Status: AC
  Administered 2024-01-21: 1000 mg via ORAL
  Filled 2024-01-21: qty 2

## 2024-01-21 MED ORDER — PROPOFOL 10 MG/ML IV BOLUS
INTRAVENOUS | Status: DC | PRN
  Administered 2024-01-21: 150 mg via INTRAVENOUS

## 2024-01-21 MED ORDER — FENTANYL CITRATE (PF) 100 MCG/2ML IJ SOLN
INTRAMUSCULAR | Status: AC
Start: 2024-01-21 — End: ?
  Filled 2024-01-21: qty 2

## 2024-01-21 MED ORDER — OXYCODONE HCL 5 MG PO TABS
ORAL_TABLET | ORAL | Status: AC
  Filled 2024-01-21: qty 1

## 2024-01-21 MED ORDER — ALBUMIN HUMAN 5 % IV SOLN
INTRAVENOUS | Status: AC
  Filled 2024-01-21: qty 250

## 2024-01-21 MED ORDER — FENTANYL CITRATE PF 50 MCG/ML IJ SOSY
50.0000 ug | PREFILLED_SYRINGE | INTRAMUSCULAR | Status: DC | PRN
  Administered 2024-01-21: 50 ug via INTRAVENOUS
  Filled 2024-01-21: qty 2

## 2024-01-21 MED ORDER — DEXAMETHASONE SODIUM PHOSPHATE 10 MG/ML IJ SOLN
INTRAMUSCULAR | Status: DC | PRN
  Administered 2024-01-21: 10 mg via INTRAVENOUS

## 2024-01-21 MED ORDER — DEXAMETHASONE SODIUM PHOSPHATE 10 MG/ML IJ SOLN
INTRAMUSCULAR | Status: AC
  Filled 2024-01-21: qty 1

## 2024-01-21 MED ORDER — ACETAMINOPHEN 10 MG/ML IV SOLN: 1000.0000 mg | Freq: Once | INTRAVENOUS | Status: DC | PRN

## 2024-01-21 SURGICAL SUPPLY — 56 items
BAG COUNTER SPONGE SURGICOUNT (BAG) IMPLANT
BAG ZIPLOCK 12X15 (MISCELLANEOUS) ×1 IMPLANT
BIT DRILL QUICK REL 1/8 2PK SL (BIT) IMPLANT
BLADE SAW SGTL 73X25 THK (BLADE) ×1 IMPLANT
CEMENT BONE R 1X40 (Cement) IMPLANT
CLSR STERI-STRIP ANTIMIC 1/2X4 (GAUZE/BANDAGES/DRESSINGS) ×1 IMPLANT
COOLER ICEMAN CLASSIC (MISCELLANEOUS) ×1 IMPLANT
COVER BACK TABLE 60X90IN (DRAPES) ×1 IMPLANT
COVER MAYO STAND STRL (DRAPES) ×1 IMPLANT
COVER SURGICAL LIGHT HANDLE (MISCELLANEOUS) ×1 IMPLANT
DRAPE IMP U-DRAPE 54X76 (DRAPES) ×2 IMPLANT
DRAPE POUCH INSTRU U-SHP 10X18 (DRAPES) ×1 IMPLANT
DRAPE SHEET LG 3/4 BI-LAMINATE (DRAPES) ×2 IMPLANT
DRAPE SURG 17X11 SM STRL (DRAPES) ×1 IMPLANT
DRAPE U-SHAPE 47X51 STRL (DRAPES) ×1 IMPLANT
DRSG MEPILEX POST OP 4X8 (GAUZE/BANDAGES/DRESSINGS) ×1 IMPLANT
DURAPREP 26ML APPLICATOR (WOUND CARE) ×2 IMPLANT
ELECT REM PT RETURN 15FT ADLT (MISCELLANEOUS) ×1 IMPLANT
FACESHIELD WRAPAROUND (MASK) ×1 IMPLANT
FACESHIELD WRAPAROUND OR TEAM (MASK) ×1 IMPLANT
GLENOID MOD PE 4 PEG SZ 5 (Shoulder) IMPLANT
GLENOID MOD POST TM SZ2 (Post) IMPLANT
GLOVE BIO SURGEON STRL SZ 6.5 (GLOVE) ×1 IMPLANT
GLOVE BIOGEL PI IND STRL 7.0 (GLOVE) ×1 IMPLANT
GLOVE BIOGEL PI IND STRL 8 (GLOVE) ×1 IMPLANT
GLOVE ORTHO TXT STRL SZ7.5 (GLOVE) ×1 IMPLANT
GOWN STRL SURGICAL XL XLNG (GOWN DISPOSABLE) ×2 IMPLANT
HEAD HUMERAL COMP STD (Orthopedic Implant) IMPLANT
HEAD MODULAR 50MMX21MMX57MM (Orthopedic Implant) IMPLANT
HOOD PEEL AWAY T7 (MISCELLANEOUS) ×2 IMPLANT
HUMERAL HEAD COMP STD (Orthopedic Implant) ×1 IMPLANT
KIT BASIN OR (CUSTOM PROCEDURE TRAY) ×1 IMPLANT
KIT TURNOVER KIT A (KITS) IMPLANT
MODULAR HEAD 50MMX21MMX57MM (Orthopedic Implant) ×1 IMPLANT
NS IRRIG 1000ML POUR BTL (IV SOLUTION) ×1 IMPLANT
PACK SHOULDER (CUSTOM PROCEDURE TRAY) ×1 IMPLANT
PAD ARMBOARD 7.5X6 YLW CONV (MISCELLANEOUS) ×1 IMPLANT
PAD COLD SHLDR WRAP-ON (PAD) ×1 IMPLANT
PIN THREADED REVERSE (PIN) IMPLANT
RESTRAINT HEAD UNIVERSAL NS (MISCELLANEOUS) ×1 IMPLANT
SET HNDPC FAN SPRY TIP SCT (DISPOSABLE) ×1 IMPLANT
SLING ARM IMMOBILIZER LRG (SOFTGOODS) ×1 IMPLANT
SMARTMIX MINI TOWER (MISCELLANEOUS) ×1 IMPLANT
SPIKE FLUID TRANSFER (MISCELLANEOUS) IMPLANT
SPONGE T-LAP 18X18 ~~LOC~~+RFID (SPONGE) IMPLANT
STEM HUMERAL STRL 15MMX140MM (Stem) IMPLANT
SUCTION TUBE FRAZIER 12FR DISP (SUCTIONS) ×1 IMPLANT
SUPPORT WRAP ARM LG (MISCELLANEOUS) ×1 IMPLANT
SUT MAXBRAID #2 CVD NDL (SUTURE) ×1 IMPLANT
SUT MAXBRAID #5 CCS-NDL 2PK (SUTURE) ×2 IMPLANT
SUT VIC AB 0 CT1 27XBRD ANBCTR (SUTURE) ×1 IMPLANT
SUT VIC AB 2-0 CT1 TAPERPNT 27 (SUTURE) ×1 IMPLANT
SUT VIC AB 3-0 SH 27X BRD (SUTURE) IMPLANT
TOWEL OR 17X26 10 PK STRL BLUE (TOWEL DISPOSABLE) ×1 IMPLANT
TOWER SMARTMIX MINI (MISCELLANEOUS) IMPLANT
WATER STERILE IRR 1000ML POUR (IV SOLUTION) ×2 IMPLANT

## 2024-01-21 NOTE — Op Note (Signed)
 01/21/2024  3:42 PM  PATIENT:  Gordon Madden    PRE-OPERATIVE DIAGNOSIS: Right shoulder primary localized osteoarthritis  POST-OPERATIVE DIAGNOSIS:  Same  PROCEDURE: RIGHT total Shoulder Arthroplasty  SURGEON:  Eulas Post, MD  PHYSICIAN ASSISTANT: Dan Humphreys, PA-C, present and scrubbed throughout the case, critical for completion in a timely fashion, and for retraction, instrumentation, and closure.  Second Assistant: Darron Doom, RNFA  ANESTHESIA:   General with an interscalene block  ESTIMATED BLOOD LOSS: 250 mL  UNIQUE ASPECTS OF THE CASE: His anatomy was fairly large, the head had a moderate amount of osteophyte, and even so, removing the osteophyte I felt like a 46 was too small, and the 50 seem to reconstruct his anatomy more normally.  I had excellent soft tissue closure with the subscapularis, it almost looked redundant, but seem to move well.  Not sure if my guidepin line was a little bit off, I had it fairly perpendicular to the face, I reamed down posteriorly more than anteriorly, which was not what I expected, although I had all 4 holes contained within the vault.  He had a large cyst underneath the greater tuberosity, which I removed after the humeral head cut.  The rotator cuff appeared intact.  PREOPERATIVE INDICATIONS:  Gordon Madden is a  73 y.o. male who failed conservative measures and elected for surgical management.    The risks benefits and alternatives were discussed with the patient preoperatively including but not limited to the risks of infection, bleeding, nerve injury, cardiopulmonary complications, the need for revision surgery, dislocation, loosening, incomplete relief of pain, among others, and the patient was willing to proceed.   OPERATIVE IMPLANTS:  Implant Name Type Inv. Item Serial No. Manufacturer Lot No. LRB No. Used Action  CEMENT BONE R 1X40 - [id number] Cement CEMENT BONE R 1X40  [employer]IM[employer]ON(ORTH,TRAU,BIO,SG) [id number] Right 1 Implanted   GLENOID MOD PE 4 PEG SZ 5 - [id number] Shoulder GLENOID MOD PE 4 PEG SZ 5  [employer]IM[employer]ON(ORTH,TRAU,BIO,SG) [id number] Right 1 Implanted  GLENOID MOD POST TM SZ2 - [id number] Post GLENOID MOD POST TM SZ2  ZIMMER [employer]ON(ORTH,TRAU,BIO,SG) [id number] Right 1 Implanted  HUMERAL HEAD COMP STD - [id number] Orthopedic Implant HUMERAL HEAD COMP STD  ZIMMER [employer]ON(ORTH,TRAU,BIO,SG) [id number] Right 1 Implanted  MODULAR HEAD 50MMX21MMX57MM - [id number] Orthopedic Implant MODULAR HEAD 50MMX21MMX57MM  ZIMMER [employer]ON(ORTH,TRAU,BIO,SG) [id number] Right 1 Implanted  STEM HUMERAL STRL 15MMX140MM - [id number] Stem STEM HUMERAL STRL 15MMX140MM  ZIMMER [employer]ON(ORTH,TRAU,BIO,SG) [id number] Right 1 Implanted     OPERATIVE FINDINGS: Advanced glenohumeral osteoarthritis involving the glenoid and the humeral head with substantial osteophyte formation inferiorly.   OPERATIVE PROCEDURE: The patient was brought to the operating room and placed in the supine position. General anesthesia was administered. IV antibiotics were given.  The upper extremity was prepped and draped in usual sterile fashion. The patient was in a beachchair position with all bony prominences padded.   Time out was performed and a deltopectoral approach was carried out. The biceps tendon was tenodesed to the pectoralis tendon. The subscapularis was released, tagging it with a #2 max braid, leaving a cuff of tendon for repair.   The inferior osteophyte was removed, and release of the capsule off of the humeral side was completed. The head was dislocated, and I reamed sequentially. I placed the humeral cutting guide at 30 of retroversion, and then pinned this into place, and made my humeral neck cut. This was at the appropriate level.   I then placed deep retractors and exposed  the glenoid. I excised the labrum circumferentially, taking care to protect the axillary nerve inferiorly.   I then placed a guidewire into the center position, controlling appropriate version  and inclination. I then reamed over the guidewire with the small reamer, and was satisfied with the preparation. I preserved the subchondral bone in order to maximize the strength and minimize the risk for subsequent subsidence.   I then drilled the central hole for the regenerex peg, and then placed the guide, and then drilled the 3 peripheral peg holes. I had excellent bony circumferential contact.   I then cleaned the glenoid, irrigated it copiously, and then dried it and cemented the prosthesis into place. Excellent seating was achieved. I had full exposure. The cement cured while I turned my attention to the humeral side.   I sequentially broached, up to the selected size, with the broach set at 30 of retroversion. I placed 3 # 5 max braid through the bone for subsequent repair.  I then placed the real stem. I trialed with multiple heads, and the above-named component was selected. Increased posterior coverage improved the coverage. The soft tissue tension was appropriate.   I then impacted the real humeral head into place, reduced the head, and irrigated copiously. Excellent stability and range of motion was achieved. I repaired the subscapularis with a total of 3 #5 max braid through the bone and tendon, and then 1 #2 and 1 #5 max braid for the rotator interval.  Excellent repair achieved and I irrigated copiously once more. The subcutaneous tissue was closed with Vicryl including the deltopectoral fascia.   The skin was closed with Steri-Strips and sterile gauze was applied. He had a preoperative nerve block. He tolerated the procedure well and there were no complications.

## 2024-01-21 NOTE — Discharge Instructions (Signed)
 Diet: As you were doing prior to hospitalization   Shower:  May shower but keep the wounds dry, use an occlusive plastic wrap, NO SOAKING IN TUB.  If the bandage gets wet, change with a clean dry gauze.  If you have a splint on, leave the splint in place and keep the splint dry with a plastic bag.  Dressing:  You may change your dressing 3-5 days after surgery, unless you have a splint.  If you have a splint, then just leave the splint in place and we will change your bandages during your first follow-up appointment.    If you had hand or foot surgery, we will plan to remove your stitches in about 2 weeks in the office.  For all other surgeries, there are sticky tapes (steri-strips) on your wounds and all the stitches are absorbable.  Leave the steri-strips in place when changing your dressings, they will peel off with time, usually 2-3 weeks.  Activity:  Increase activity slowly as tolerated, but follow the weight bearing instructions below.  The rules on driving is that you can not be taking narcotics while you drive, and you must feel in control of the vehicle.    Weight Bearing:   sling at all times except hygiene.    To prevent constipation: you may use a stool softener such as -  Colace (over the counter) 100 mg by mouth twice a day  Drink plenty of fluids (prune juice may be helpful) and high fiber foods Miralax (over the counter) for constipation as needed.    Itching:  If you experience itching with your medications, try taking only a single pain pill, or even half a pain pill at a time.  You may take up to 10 pain pills per day, and you can also use benadryl over the counter for itching or also to help with sleep.   Precautions:  If you experience chest pain or shortness of breath - call 911 immediately for transfer to the hospital emergency department!!  If you develop a fever greater that 101 F, purulent drainage from wound, increased redness or drainage from wound, or calf pain --  Call the office at [phone]                                                Follow- Up Appointment:  Please call for an appointment to be seen in 2 weeks [place] - [phone]

## 2024-01-21 NOTE — Anesthesia Postprocedure Evaluation (Signed)
 Anesthesia Post Note  Patient: Gordon Madden  Procedure(s) Performed: TOTAL SHOULDER ARTHROPLASTY (Right: Shoulder)     Patient location during evaluation: PACU Anesthesia Type: General Level of consciousness: awake and alert Pain management: pain level controlled Vital Signs Assessment: post-procedure vital signs reviewed and stable Respiratory status: spontaneous breathing, nonlabored ventilation, respiratory function stable and patient connected to nasal cannula oxygen Cardiovascular status: blood pressure returned to baseline and stable Postop Assessment: no apparent nausea or vomiting Anesthetic complications: no  No notable events documented.  Last Vitals:  Vitals:   01/21/24 1711 01/21/24 1720  BP: 111/82   Pulse: 61   Resp:  18  Temp: 36.6 C   SpO2: 95%     Last Pain:  Vitals:   01/21/24 1720  TempSrc:   PainSc: 3                  Kennieth Rad

## 2024-01-21 NOTE — Transfer of Care (Signed)
 Immediate Anesthesia Transfer of Care Note  Patient: Gordon Madden  Procedure(s) Performed: TOTAL SHOULDER ARTHROPLASTY (Right: Shoulder)  Patient Location: PACU  Anesthesia Type:General  Level of Consciousness: sedated and responds to stimulation  Airway & Oxygen Therapy: Patient Spontanous Breathing and Patient connected to face mask oxygen  Post-op Assessment: Report given to RN  Post vital signs: stable  Last Vitals:  Vitals Value Taken Time  BP 104/65 01/21/24 1630  Temp    Pulse 52 01/21/24 1633  Resp 14 01/21/24 1633  SpO2 100 % 01/21/24 1633  Vitals shown include unfiled device data.  Last Pain:  Vitals:   01/21/24 1300  TempSrc:   PainSc: 0-No pain      Patients Stated Pain Goal: 4 (01/21/24 1123)  Complications: No notable events documented.

## 2024-01-21 NOTE — Interval H&P Note (Signed)
 History and Physical Interval Note:  01/21/2024 1:22 PM  Gordon Madden  has presented today for surgery, with the diagnosis of right shoulder OA.  The various methods of treatment have been discussed with the patient and family. After consideration of risks, benefits and other options for treatment, the patient has consented to  Procedure(s): TOTAL SHOULDER ARTHROPLASTY (Right) as a surgical intervention.  The patient's history has been reviewed, patient examined, no change in status, stable for surgery.  I have reviewed the patient's chart and labs.  Questions were answered to the patient's satisfaction.     Eulas Post

## 2024-01-21 NOTE — Progress Notes (Signed)
 PT Note  Patient Details Name: Gordon Madden MRN: [id number] DOB: [birth year]   Pt s/p Total shoulder in PACU. PT order in for patient, spoke to Dr. Dion Saucier and he stated no PT or OT needed for this patient. So DC the order.    Marella Bile 01/21/2024, 5:14 PM Clois Dupes, PT, MPT Acute Rehabilitation Services Office: [phone] If a weekend: secure chat groups: [facility] PT, [facility] OT, [facility] SLP 01/21/2024

## 2024-01-21 NOTE — Interval H&P Note (Signed)
 History and Physical Interval Note:  01/21/2024 11:10 AM  Gordon Madden  has presented today for surgery, with the diagnosis of right shoulder OA.  The various methods of treatment have been discussed with the patient and family. After consideration of risks, benefits and other options for treatment, the patient has consented to  Procedure(s): TOTAL SHOULDER ARTHROPLASTY (Right) as a surgical intervention.  The patient's history has been reviewed, patient examined, no change in status, stable for surgery.  I have reviewed the patient's chart and labs.  Questions were answered to the patient's satisfaction.     Eulas Post

## 2024-01-21 NOTE — Anesthesia Procedure Notes (Signed)
 Procedure Name: Intubation Date/Time: 01/21/2024 1:42 PM  Performed by: Florene Route, CRNAPre-anesthesia Checklist: Patient identified, Emergency Drugs available, Suction available and Patient being monitored Patient Re-evaluated:Patient Re-evaluated prior to induction Oxygen Delivery Method: Circle system utilized Preoxygenation: Pre-oxygenation with 100% oxygen Induction Type: IV induction Ventilation: Mask ventilation without difficulty Laryngoscope Size: Miller and 3 Grade View: Grade I Tube type: Oral Tube size: 8.0 mm Number of attempts: 1 Airway Equipment and Method: Stylet and Oral airway Placement Confirmation: ETT inserted through vocal cords under direct vision, positive ETCO2 and breath sounds checked- equal and bilateral Secured at: 22 cm Tube secured with: Tape Dental Injury: Teeth and Oropharynx as per pre-operative assessment

## 2024-01-24 ENCOUNTER — Encounter (HOSPITAL_COMMUNITY): Payer: Self-pay | Admitting: Orthopedic Surgery

## 2024-01-24 DIAGNOSIS — M25611 Stiffness of right shoulder, not elsewhere classified: Secondary | ICD-10-CM | POA: Diagnosis not present

## 2024-01-24 DIAGNOSIS — M19011 Primary osteoarthritis, right shoulder: Secondary | ICD-10-CM | POA: Diagnosis not present

## 2024-01-24 DIAGNOSIS — M6281 Muscle weakness (generalized): Secondary | ICD-10-CM | POA: Diagnosis not present

## 2024-01-29 MED ORDER — BUPIVACAINE LIPOSOME 1.3 % IJ SUSP
INTRAMUSCULAR | Status: DC | PRN
  Administered 2024-01-21: 10 mL

## 2024-01-29 MED ORDER — BUPIVACAINE HCL (PF) 0.5 % IJ SOLN
INTRAMUSCULAR | Status: DC | PRN
  Administered 2024-01-21: 10 mL

## 2024-01-29 NOTE — Addendum Note (Signed)
 Addendum  created 01/29/24 0939 by Mariann Barter, MD   Child order released for a procedure order, Clinical Note Signed, Intraprocedure Blocks edited, Intraprocedure Meds edited, Order Canceled from Note, SmartForm saved

## 2024-01-29 NOTE — Anesthesia Procedure Notes (Signed)
 Anesthesia Regional Block: Interscalene brachial plexus block   Pre-Anesthetic Checklist: , timeout performed,  Correct Patient, Correct Site, Correct Laterality,  Correct Procedure, Correct Position, site marked,  Risks and benefits discussed,  Surgical consent,  Pre-op evaluation,  At surgeon's request and post-op pain management  Laterality: Right  Prep: Maximum Sterile Barrier Precautions used, chloraprep       Needles:  Injection technique: Single-shot  Needle Type: Echogenic Needle      Needle Gauge: 20     Additional Needles:   Procedures:,,,, ultrasound used (permanent image in chart),,    Narrative:  Start time: 01/21/2024 12:40 PM End time: 01/21/2024 12:45 PM Injection made incrementally with aspirations every 5 mL.  Performed by: Personally  Anesthesiologist: Mariann Barter, MD

## 2024-01-30 DIAGNOSIS — M6281 Muscle weakness (generalized): Secondary | ICD-10-CM | POA: Diagnosis not present

## 2024-01-30 DIAGNOSIS — M25611 Stiffness of right shoulder, not elsewhere classified: Secondary | ICD-10-CM | POA: Diagnosis not present

## 2024-01-30 DIAGNOSIS — M19011 Primary osteoarthritis, right shoulder: Secondary | ICD-10-CM | POA: Diagnosis not present

## 2024-02-03 DIAGNOSIS — M25562 Pain in left knee: Secondary | ICD-10-CM | POA: Diagnosis not present

## 2024-02-03 DIAGNOSIS — M19011 Primary osteoarthritis, right shoulder: Secondary | ICD-10-CM | POA: Diagnosis not present

## 2024-02-04 DIAGNOSIS — M25611 Stiffness of right shoulder, not elsewhere classified: Secondary | ICD-10-CM | POA: Diagnosis not present

## 2024-02-04 DIAGNOSIS — M19011 Primary osteoarthritis, right shoulder: Secondary | ICD-10-CM | POA: Diagnosis not present

## 2024-02-04 DIAGNOSIS — M6281 Muscle weakness (generalized): Secondary | ICD-10-CM | POA: Diagnosis not present

## 2024-02-06 DIAGNOSIS — M19011 Primary osteoarthritis, right shoulder: Secondary | ICD-10-CM | POA: Diagnosis not present

## 2024-02-06 DIAGNOSIS — M25611 Stiffness of right shoulder, not elsewhere classified: Secondary | ICD-10-CM | POA: Diagnosis not present

## 2024-02-06 DIAGNOSIS — M6281 Muscle weakness (generalized): Secondary | ICD-10-CM | POA: Diagnosis not present

## 2024-02-08 ENCOUNTER — Other Ambulatory Visit: Payer: Self-pay | Admitting: Interventional Cardiology

## 2024-02-10 ENCOUNTER — Other Ambulatory Visit: Payer: Self-pay

## 2024-02-10 MED ORDER — ROSUVASTATIN CALCIUM 20 MG PO TABS: 20.0000 mg | ORAL_TABLET | Freq: Every day | ORAL | 3 refills | Status: AC

## 2024-02-11 DIAGNOSIS — M25611 Stiffness of right shoulder, not elsewhere classified: Secondary | ICD-10-CM | POA: Diagnosis not present

## 2024-02-11 DIAGNOSIS — M19011 Primary osteoarthritis, right shoulder: Secondary | ICD-10-CM | POA: Diagnosis not present

## 2024-02-11 DIAGNOSIS — M6281 Muscle weakness (generalized): Secondary | ICD-10-CM | POA: Diagnosis not present

## 2024-02-19 DIAGNOSIS — M19011 Primary osteoarthritis, right shoulder: Secondary | ICD-10-CM | POA: Diagnosis not present

## 2024-02-19 DIAGNOSIS — M6281 Muscle weakness (generalized): Secondary | ICD-10-CM | POA: Diagnosis not present

## 2024-02-19 DIAGNOSIS — M25611 Stiffness of right shoulder, not elsewhere classified: Secondary | ICD-10-CM | POA: Diagnosis not present

## 2024-02-25 DIAGNOSIS — M25611 Stiffness of right shoulder, not elsewhere classified: Secondary | ICD-10-CM | POA: Diagnosis not present

## 2024-02-25 DIAGNOSIS — M19011 Primary osteoarthritis, right shoulder: Secondary | ICD-10-CM | POA: Diagnosis not present

## 2024-02-25 DIAGNOSIS — M6281 Muscle weakness (generalized): Secondary | ICD-10-CM | POA: Diagnosis not present

## 2024-03-02 DIAGNOSIS — T8484XA Pain due to internal orthopedic prosthetic devices, implants and grafts, initial encounter: Secondary | ICD-10-CM | POA: Diagnosis not present

## 2024-03-03 DIAGNOSIS — M19011 Primary osteoarthritis, right shoulder: Secondary | ICD-10-CM | POA: Diagnosis not present

## 2024-03-03 DIAGNOSIS — M6281 Muscle weakness (generalized): Secondary | ICD-10-CM | POA: Diagnosis not present

## 2024-03-03 DIAGNOSIS — M25611 Stiffness of right shoulder, not elsewhere classified: Secondary | ICD-10-CM | POA: Diagnosis not present

## 2024-03-10 DIAGNOSIS — M19011 Primary osteoarthritis, right shoulder: Secondary | ICD-10-CM | POA: Diagnosis not present

## 2024-03-10 DIAGNOSIS — M25611 Stiffness of right shoulder, not elsewhere classified: Secondary | ICD-10-CM | POA: Diagnosis not present

## 2024-03-10 DIAGNOSIS — M6281 Muscle weakness (generalized): Secondary | ICD-10-CM | POA: Diagnosis not present

## 2024-03-18 DIAGNOSIS — M19011 Primary osteoarthritis, right shoulder: Secondary | ICD-10-CM | POA: Diagnosis not present

## 2024-03-18 DIAGNOSIS — M6281 Muscle weakness (generalized): Secondary | ICD-10-CM | POA: Diagnosis not present

## 2024-03-18 DIAGNOSIS — M25611 Stiffness of right shoulder, not elsewhere classified: Secondary | ICD-10-CM | POA: Diagnosis not present

## 2024-03-19 ENCOUNTER — Other Ambulatory Visit (HOSPITAL_COMMUNITY): Payer: Self-pay | Admitting: Orthopedic Surgery

## 2024-03-19 DIAGNOSIS — M25562 Pain in left knee: Secondary | ICD-10-CM | POA: Diagnosis not present

## 2024-03-23 ENCOUNTER — Ambulatory Visit (HOSPITAL_COMMUNITY)
Admission: RE | Admit: 2024-03-23 | Discharge: 2024-03-23 | Disposition: A | Discharge status: Home / Self Care | Source: Ambulatory Visit | Attending: Orthopedic Surgery | Admitting: Orthopedic Surgery

## 2024-03-23 DIAGNOSIS — Z96653 Presence of artificial knee joint, bilateral: Secondary | ICD-10-CM | POA: Diagnosis not present

## 2024-03-23 DIAGNOSIS — M25562 Pain in left knee: Secondary | ICD-10-CM | POA: Insufficient documentation

## 2024-03-23 DIAGNOSIS — Z471 Aftercare following joint replacement surgery: Secondary | ICD-10-CM | POA: Diagnosis not present

## 2024-03-23 MED ORDER — TECHNETIUM TC 99M MEDRONATE IV KIT
20.0000 | PACK | Freq: Once | INTRAVENOUS | Status: AC | PRN
  Administered 2024-03-23: 20 via INTRAVENOUS

## 2024-03-25 DIAGNOSIS — M25611 Stiffness of right shoulder, not elsewhere classified: Secondary | ICD-10-CM | POA: Diagnosis not present

## 2024-03-25 DIAGNOSIS — M19011 Primary osteoarthritis, right shoulder: Secondary | ICD-10-CM | POA: Diagnosis not present

## 2024-03-25 DIAGNOSIS — M6281 Muscle weakness (generalized): Secondary | ICD-10-CM | POA: Diagnosis not present

## 2024-03-30 DIAGNOSIS — M19011 Primary osteoarthritis, right shoulder: Secondary | ICD-10-CM | POA: Diagnosis not present

## 2024-03-31 ENCOUNTER — Telehealth: Payer: Self-pay

## 2024-03-31 NOTE — Telephone Encounter (Signed)
   Pre-operative Risk Assessment    Patient Name: Gordon Madden  DOB: 29-Sep-1951 MRN: [id number]   Date of last office visit: 01/17/24 Date of next office visit: n/a   Request for Surgical Clearance    Procedure:   Lt Patellectomy for AVN   Date of Surgery:  Clearance TBD                                 Surgeon:  Priscille Brought, MD  Surgeon's Group or Practice Name:  [facility]  Phone number:  [phone] Fax number:  [phone]   Type of Clearance Requested:   - Medical  - Pharmacy:  Hold Aspirin  Not indicated    Type of Anesthesia:   Choice    Additional requests/questions:    SignedZulema Hitchcock   03/31/2024, 10:59 AM

## 2024-03-31 NOTE — Telephone Encounter (Signed)
   Primary Cardiologist: Arun K Thukkani, MD  Chart reviewed as part of pre-operative protocol coverage. Given past medical history and time since last visit, based on ACC/AHA guidelines, Gordon Madden would be at acceptable risk for the planned procedure without further cardiovascular testing.   Patient was advised that if he develops new symptoms prior to surgery to contact our office to arrange a follow-up appointment.  He verbalized understanding.  I will route this recommendation to the requesting party via Epic fax function and remove from pre-op pool.  Please call with questions.  Gerldine Koch, NP-C  03/31/2024, 11:35 AM [address] [place], [place] [address] Office [phone] Fax [phone]

## 2024-04-01 DIAGNOSIS — M19011 Primary osteoarthritis, right shoulder: Secondary | ICD-10-CM | POA: Diagnosis not present

## 2024-04-01 DIAGNOSIS — M6281 Muscle weakness (generalized): Secondary | ICD-10-CM | POA: Diagnosis not present

## 2024-04-01 DIAGNOSIS — M25611 Stiffness of right shoulder, not elsewhere classified: Secondary | ICD-10-CM | POA: Diagnosis not present

## 2024-04-02 DIAGNOSIS — N401 Enlarged prostate with lower urinary tract symptoms: Secondary | ICD-10-CM | POA: Diagnosis not present

## 2024-04-02 DIAGNOSIS — D696 Thrombocytopenia, unspecified: Secondary | ICD-10-CM | POA: Diagnosis not present

## 2024-04-02 DIAGNOSIS — I429 Cardiomyopathy, unspecified: Secondary | ICD-10-CM | POA: Diagnosis not present

## 2024-04-02 DIAGNOSIS — E1121 Type 2 diabetes mellitus with diabetic nephropathy: Secondary | ICD-10-CM | POA: Diagnosis not present

## 2024-04-02 DIAGNOSIS — M8708 Idiopathic aseptic necrosis of bone, other site: Secondary | ICD-10-CM | POA: Diagnosis not present

## 2024-04-02 DIAGNOSIS — E782 Mixed hyperlipidemia: Secondary | ICD-10-CM | POA: Diagnosis not present

## 2024-04-02 DIAGNOSIS — I119 Hypertensive heart disease without heart failure: Secondary | ICD-10-CM | POA: Diagnosis not present

## 2024-04-02 DIAGNOSIS — G4733 Obstructive sleep apnea (adult) (pediatric): Secondary | ICD-10-CM | POA: Diagnosis not present

## 2024-04-02 DIAGNOSIS — Z96611 Presence of right artificial shoulder joint: Secondary | ICD-10-CM | POA: Diagnosis not present

## 2024-04-06 ENCOUNTER — Encounter: Payer: Self-pay | Admitting: Internal Medicine

## 2024-04-07 DIAGNOSIS — M19011 Primary osteoarthritis, right shoulder: Secondary | ICD-10-CM | POA: Diagnosis not present

## 2024-04-07 DIAGNOSIS — M25611 Stiffness of right shoulder, not elsewhere classified: Secondary | ICD-10-CM | POA: Diagnosis not present

## 2024-04-07 DIAGNOSIS — M6281 Muscle weakness (generalized): Secondary | ICD-10-CM | POA: Diagnosis not present

## 2024-04-14 DIAGNOSIS — M25611 Stiffness of right shoulder, not elsewhere classified: Secondary | ICD-10-CM | POA: Diagnosis not present

## 2024-04-14 DIAGNOSIS — M19011 Primary osteoarthritis, right shoulder: Secondary | ICD-10-CM | POA: Diagnosis not present

## 2024-04-14 DIAGNOSIS — M6281 Muscle weakness (generalized): Secondary | ICD-10-CM | POA: Diagnosis not present

## 2024-04-16 DIAGNOSIS — M25562 Pain in left knee: Secondary | ICD-10-CM | POA: Diagnosis not present

## 2024-04-22 ENCOUNTER — Ambulatory Visit: Payer: Self-pay | Admitting: Emergency Medicine

## 2024-04-22 DIAGNOSIS — M6281 Muscle weakness (generalized): Secondary | ICD-10-CM | POA: Diagnosis not present

## 2024-04-22 DIAGNOSIS — G8929 Other chronic pain: Secondary | ICD-10-CM

## 2024-04-22 DIAGNOSIS — M25611 Stiffness of right shoulder, not elsewhere classified: Secondary | ICD-10-CM | POA: Diagnosis not present

## 2024-04-22 DIAGNOSIS — M19011 Primary osteoarthritis, right shoulder: Secondary | ICD-10-CM | POA: Diagnosis not present

## 2024-04-22 NOTE — H&P (Signed)
 PATELLECTOMY ADMISSION H&P  Patient is being admitted for left patellectomy status post total knee arthroplasty.  Subjective:  Chief Complaint:left knee pain.  HPI: Gordon Madden, 73 y.o. 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  Onset of symptoms was gradual, starting 3-5 years ago with gradually worsening course since that time. The patient noted prior procedures on the knee to include  arthroscopy on the left knee(s).  Patient currently rates pain in the left knee(s) at 8 out of 10 with activity. Patient has night pain, worsening of pain with activity and weight bearing, pain that interferes with activities of daily living, and pain with passive range of motion.  Patient has evidence of avascular necrosis of patella by imaging studies.  There is no active infection.  Patient Active Problem List   Diagnosis Date Noted   Primary localized osteoarthrosis of right shoulder 01/21/2024   S/P TKR (total knee replacement), right 10/23/2022   Pain due to unicompartmental arthroplasty of knee (HCC) 10/23/2022   Allergic rhinitis 04/26/2021   Cardiomyopathy (HCC) 04/26/2021   Chronic kidney disease, stage 1 04/26/2021   Chronic systolic heart failure (HCC) 04/26/2021   Diabetic renal disease (HCC) 04/26/2021   Fatty liver 04/26/2021   Hypertensive heart disease without congestive heart failure 04/26/2021   Mixed hyperlipidemia 04/26/2021   Noninfective gastroenteritis and colitis, unspecified 04/26/2021   History of colonic polyps 04/26/2021   Primary localized osteoarthritis of right knee 08/06/2017   S/P knee replacement 08/06/2017   Ischemic cardiomyopathy 07/31/2017   DOE (dyspnea on exertion) 05/15/2017   Hypersomnia 02/04/2017   Primary localized osteoarthritis of left knee 06/07/2015   Idiopathic thrombocytopenia (HCC) 05/09/2015   Insomnia 05/09/2014   Coronary atherosclerosis of native  coronary artery 04/26/2014   Obesity 04/26/2014   Tobacco use disorder 04/26/2014   Mitral valve disorder 04/26/2014   RHINOSINUSITIS, CHRONIC 08/08/2010   Essential hypertension 07/02/2010   Old MI (myocardial infarction) 07/02/2010   C V A / STROKE 07/02/2010   Obstructive sleep apnea 06/30/2010   Past Medical History:  Diagnosis Date   Arthritis    Ascending aorta dilatation (HCC)    BPH (benign prostatic hyperplasia)    CAD (coronary artery disease)    a. MI 2004 with occ RCA.   Cardiomyopathy    EF 40%   Chronic ITP (idiopathic thrombocytopenia) (HCC)    Chronic systolic CHF (congestive heart failure) (HCC)    Colon polyps    CPAP (continuous positive airway pressure) dependence    Diabetes mellitus, type 2 (HCC)    no meds due to weiht loss   Heart murmur    02-18-23  echo in epic   History of gout    History of kidney stones    Hyperlipidemia    Hypertension    Hypoglycemia    since weight loss wears dexcom to monitor and has been regulating per pt.   Idiopathic thrombocytopenia (HCC)    Morbid obesity (HCC)    Myocardial infarction (HCC)    OSA (obstructive sleep apnea)    07/25/10 AHI 14.9/hr   no cpap due to weight loss   Pneumonia    Primary localized osteoarthritis of left knee 06/07/2015   Primary localized osteoarthritis of right knee 08/06/2017   Primary localized osteoarthrosis of right shoulder 01/21/2024   Rhinosinusitis    Shortness of breath dyspnea    Stroke [facility])    right amaurosis fugax, concern  for retinal artery occlusion 12/2009; cerebral arteriography 01/30/10: no occlusion, stenoses, dissections, aneurysms, AVM noted, patent opthalmic arteries   Vision decreased     Past Surgical History:  Procedure Laterality Date   CARDIAC CATHETERIZATION  2005   no stents   CHOLECYSTECTOMY N/A 02/02/2021   Procedure: LAPAROSCOPIC CHOLECYSTECTOMY;  Surgeon: Lujean Sake, MD;  Location: [facility] OR;  Service: General;  Laterality: N/A;   CYST EXCISION      chest and back   HERNIA REPAIR     umbilical   JOINT REPLACEMENT     Left 06/07/15   KNEE CARTILAGE SURGERY     left   KNEE SURGERY Right 08/06/2017   MENISCUS REPAIR Left    NASAL SINUS SURGERY     PARTIAL KNEE ARTHROPLASTY Right 08/06/2017   Procedure: UNICOMPARTMENTAL KNEE;  Surgeon: Osa Blase, MD;  Location: [facility] OR;  Service: Orthopedics;  Laterality: Right;   SINUS IRRIGATION     TOTAL KNEE ARTHROPLASTY Left 06/07/2015   Procedure: TOTAL KNEE ARTHROPLASTY;  Surgeon: Osa Blase, MD;  Location: [facility] OR;  Service: Orthopedics;  Laterality: Left;   TOTAL KNEE ARTHROPLASTY WITH REVISION COMPONENTS Right 10/23/2022   Procedure: TOTAL KNEE ARTHROPLASTY WITH REVISION COMPONENTS;  Surgeon: Osa Blase, MD;  Location: [facility];  Service: Orthopedics;  Laterality: Right;   TOTAL SHOULDER ARTHROPLASTY Right 01/21/2024   Procedure: TOTAL SHOULDER ARTHROPLASTY;  Surgeon: Osa Blase, MD;  Location: [facility];  Service: Orthopedics;  Laterality: Right;   UMBILICAL HERNIA REPAIR      Current Outpatient Medications  Medication Sig Dispense Refill Last Dose/Taking   aspirin  EC 81 MG tablet Take 1 tablet (81 mg total) by mouth daily. Swallow whole. 90 tablet 3    baclofen  (LIORESAL ) 10 MG tablet Take 1 tablet (10 mg total) by mouth 3 (three) times daily. As needed for muscle spasm 50 tablet 0    carvedilol  (COREG ) 6.25 MG tablet TAKE 1 TABLET TWICE DAILY 180 tablet 3    Coenzyme Q10 (COQ-10 PO) Take 300 mg by mouth daily.      empagliflozin  (JARDIANCE ) 10 MG TABS tablet Take 1 tablet (10 mg total) by mouth daily before breakfast. 28 tablet 0    empagliflozin  (JARDIANCE ) 10 MG TABS tablet Take 1 tablet (10 mg total) by mouth daily before breakfast. 90 tablet 3    ENTRESTO  97-103 MG TAKE 1 TABLET TWICE DAILY 180 tablet 3    fexofenadine (ALLEGRA) 180 MG tablet Take 180 mg by mouth daily.      finasteride  (PROSCAR ) 5 MG tablet Take 5 mg by mouth daily.      folic acid  (FOLVITE ) 400 MCG tablet Take  400 mcg by mouth daily.      ibuprofen (ADVIL) 200 MG tablet Take 600 mg by mouth 2 (two) times daily as needed for moderate pain (pain score 4-6).      nitroGLYCERIN  (NITROSTAT ) 0.4 MG SL tablet Place 1 tablet (0.4 mg total) under the tongue every 5 (five) minutes as needed for chest pain (chets pain). 25 tablet 6    ondansetron  (ZOFRAN ) 4 MG tablet Take 1 tablet (4 mg total) by mouth every 8 (eight) hours as needed for nausea or vomiting. 10 tablet 0    oxyCODONE  (ROXICODONE ) 5 MG immediate release tablet Take 1 tablet (5 mg total) by mouth every 4 (four) hours as needed for severe pain (pain score 7-10). 30 tablet 0    rosuvastatin  (CRESTOR ) 20 MG tablet Take 1 tablet (20 mg total) by mouth daily.  90 tablet 3    sennosides-docusate sodium  (SENOKOT-S) 8.6-50 MG tablet Take 2 tablets by mouth daily. 30 tablet 1    silodosin (RAPAFLO) 8 MG CAPS capsule Take 8 mg by mouth daily.      vitamin B-12 (CYANOCOBALAMIN ) 1000 MCG tablet Take 1,000 mcg by mouth daily.      No current facility-administered medications for this visit.   Allergies  Allergen Reactions   Plavix  [Clopidogrel ] Other (See Comments)    Thrombocytopenia-plavix  causes low platelets but pt's MD monitors pt closely, and pt does NOT take plavix .     Social History   Tobacco Use   Smoking status: Every Day    Types: Cigars    Passive exposure: Never   Smokeless tobacco: Never   Tobacco comments:    stopped smoking cigarettes in 1991, smokes 1 cigar per day  Substance Use Topics   Alcohol use: Yes    Alcohol/week: 1.0 standard drink of alcohol    Types: 1 Standard drinks or equivalent per week    Comment: occassionally    Family History  Problem Relation Age of Onset   Hypertension Father    Colon cancer Neg Hx    Heart attack Neg Hx      Review of Systems  Musculoskeletal:  Positive for arthralgias.  All other systems reviewed and are negative.   Objective:  Physical Exam Constitutional:      General: He is  not in acute distress.    Appearance: Normal appearance. He is normal weight.  HENT:     Head: Normocephalic and atraumatic.  Eyes:     Extraocular Movements: Extraocular movements intact.     Conjunctiva/sclera: Conjunctivae normal.     Pupils: Pupils are equal, round, and reactive to light.  Cardiovascular:     Rate and Rhythm: Normal rate and regular rhythm.     Pulses: Normal pulses.     Heart sounds: Murmur (at baseline) heard.  Pulmonary:     Effort: Pulmonary effort is normal. No respiratory distress.     Breath sounds: Normal breath sounds.  Abdominal:     General: Bowel sounds are normal. There is no distension.     Palpations: Abdomen is soft.     Tenderness: There is no abdominal tenderness.  Musculoskeletal:        General: Tenderness present.     Cervical back: Normal range of motion and neck supple.     Comments: TTP over left patella.  No calf tenderness, swelling, or erythema.  Well healed surgical incision, otherwise no overlying lesions of area of chief complaint.  Decreased strength and ROM due to elicited pain.  Pre-operative ROM 0-120.  Dorsiflexion and plantarflexion intact.  Stable to varus and valgus stress.  BLE appear grossly neurovascularly intact.  Gait mildly antalgic.   Lymphadenopathy:     Cervical: No cervical adenopathy.  Skin:    General: Skin is warm and dry.     Capillary Refill: Capillary refill takes less than 2 seconds.     Findings: No erythema or rash.  Neurological:     General: No focal deficit present.     Mental Status: He is alert and oriented to person, place, and time.  Psychiatric:        Mood and Affect: Mood normal.        Behavior: Behavior normal.     Vital signs in last 24 hours: @VSRANGES @  Labs:   Estimated body mass index is 35.42 kg/m as calculated from the  following:   Height as of 01/21/24: 5' 10.5" (1.791 m).   Weight as of 01/21/24: 113.6 kg.   Imaging Review Plain radiographs demonstrate avascular  necrosis of the left knee(s). The overall alignment isneutral. Total knee arthroplasty components in good position without adverse features.      Assessment/Plan:  Avascular necrosis of the patella, left knee   The patient history, physical examination, clinical judgment of the provider and imaging studies are consistent with avascular necrosis of the left knee(s) and patellectomy is deemed medically necessary. The treatment options were discussed at length. The risks and benefits of patellectomy were presented and reviewed. The risks due to aseptic loosening, infection, stiffness, patella tracking problems, thromboembolic complications and other imponderables were discussed. The patient acknowledged the explanation, agreed to proceed with the plan and consent was signed. Patient is being admitted for inpatient treatment for surgery, pain control, PT, OT, prophylactic antibiotics, VTE prophylaxis, progressive ambulation and ADL's and discharge planning. The patient is planning to be discharged home with bledsoe brace locked in extension, no physical therapy needed in initial phase of recovery.      Anticipated LOS equal to or greater than 2 midnights due to - Age 28 and older with one or more of the following:  - Obesity  - Expected need for hospital services (PT, OT, Nursing) required for safe  discharge  - Anticipated need for postoperative skilled nursing care or inpatient rehab  - Active co-morbidities: idiopathic thrombocytopenic purpura, CHF, mitral valve disorder, BPH, gout, prior MI, prior CVA, fatty liver disease, HLD, HTN, CAD  OR   - Unanticipated findings during/Post Surgery: None  - Patient is a high risk of re-admission due to: None

## 2024-04-28 DIAGNOSIS — M19011 Primary osteoarthritis, right shoulder: Secondary | ICD-10-CM | POA: Diagnosis not present

## 2024-04-28 DIAGNOSIS — M25611 Stiffness of right shoulder, not elsewhere classified: Secondary | ICD-10-CM | POA: Diagnosis not present

## 2024-04-28 DIAGNOSIS — M6281 Muscle weakness (generalized): Secondary | ICD-10-CM | POA: Diagnosis not present

## 2024-05-05 DIAGNOSIS — F4323 Adjustment disorder with mixed anxiety and depressed mood: Secondary | ICD-10-CM | POA: Diagnosis not present

## 2024-05-05 DIAGNOSIS — M25611 Stiffness of right shoulder, not elsewhere classified: Secondary | ICD-10-CM | POA: Diagnosis not present

## 2024-05-05 DIAGNOSIS — M19011 Primary osteoarthritis, right shoulder: Secondary | ICD-10-CM | POA: Diagnosis not present

## 2024-05-05 DIAGNOSIS — M6281 Muscle weakness (generalized): Secondary | ICD-10-CM | POA: Diagnosis not present

## 2024-05-11 NOTE — Patient Instructions (Signed)
 SURGICAL WAITING ROOM VISITATION Patients having surgery or a procedure may have no more than 2 support people in the waiting area - these visitors may rotate.    Children under the age of 19 must have an adult with them who is not the patient.  If the patient needs to stay at the hospital during part of their recovery, the visitor guidelines for inpatient rooms apply. Pre-op nurse will coordinate an appropriate time for 1 support person to accompany patient in pre-op.  This support person may not rotate.    Please refer to the [facility] website for the visitor guidelines for Inpatients (after your surgery is over and you are in a regular room).       Your procedure is scheduled on: 05-25-24   Report to [facility] Main Entrance    Report to admitting at 5:15 AM   Call this number if you have problems the morning of surgery [phone]   Do not eat food :After Midnight.   After Midnight you may have the following liquids until 4:30 AM DAY OF SURGERY  Water  Non-Citrus Juices (without pulp, NO RED-Apple, White grape, White cranberry) Black Coffee (NO MILK/CREAM OR CREAMERS, sugar ok)  Clear Tea (NO MILK/CREAM OR CREAMERS, sugar ok) regular and decaf                             Plain Jell-O (NO RED)                                           Fruit ices (not with fruit pulp, NO RED)                                     Popsicles (NO RED)                                                               Sports drinks like Gatorade (NO RED)                   The day of surgery:  Drink ONE (1) Pre-Surgery G2 by 4:30 AM the morning of surgery. Drink in one sitting. Do not sip.  This drink was given to you during your hospital  pre-op appointment visit. Nothing else to drink after completing the Pre-Surgery G2.          If you have questions, please contact your surgeon's office.   FOLLOW  ANY ADDITIONAL PRE OP INSTRUCTIONS YOU RECEIVED FROM YOUR SURGEON'S OFFICE!!!      Oral Hygiene is also important to reduce your risk of infection.                                    Remember - BRUSH YOUR TEETH THE MORNING OF SURGERY WITH YOUR REGULAR TOOTHPASTE   Do NOT smoke after Midnight   Take these medicines the morning of surgery with A SIP OF WATER :    Carvedilol    Allegra  Finasteride    Rosuvastatin    Silodosin   Tramadol if needed  Stop all vitamins and herbal supplements 7 days before surgery   WHAT DO I DO ABOUT MY DIABETES MEDICATION?  Do not take oral diabetes medicines (pills) the morning of surgery.        Hold Jardiance  3 days before surgery (do not take after 05-21-24)  DO NOT TAKE THE FOLLOWING 7 DAYS PRIOR TO SURGERY: Ozempic, Wegovy, Rybelsus (Semaglutide), Byetta (exenatide), Bydureon (exenatide ER), Victoza, Saxenda (liraglutide), or Trulicity (dulaglutide) Mounjaro (Tirzepatide) Adlyxin (Lixisenatide), Polyethylene Glycol Loxenatide.  Reviewed and Endorsed by [facility] Patient Education Committee, August 2015                              You may not have any metal on your body including  jewelry, and body piercing             Do not wear  lotions, powders,  cologne, or deodorant              Men may shave face and neck.   Do not bring valuables to the hospital. [facility] IS NOT RESPONSIBLE   FOR VALUABLES.   Contacts, dentures or bridgework may not be worn into surgery.   DO NOT BRING YOUR HOME MEDICATIONS TO THE HOSPITAL. PHARMACY WILL DISPENSE MEDICATIONS LISTED ON YOUR MEDICATION LIST TO YOU DURING YOUR ADMISSION IN THE HOSPITAL!    Patients discharged on the day of surgery will not be allowed to drive home.  Someone NEEDS to stay with you for the first 24 hours after anesthesia.                Please read over the following fact sheets you were given: IF YOU HAVE QUESTIONS ABOUT YOUR PRE-OP INSTRUCTIONS PLEASE CALL [phone] Gwen  If you received a COVID test during your pre-op visit  it is requested that you wear a  mask when out in public, stay away from anyone that may not be feeling well and notify your surgeon if you develop symptoms. If you test positive for Covid or have been in contact with anyone that has tested positive in the last 10 days please notify you surgeon.  [place] - Preparing for Surgery Before surgery, you can play an important role.  Because skin is not sterile, your skin needs to be as free of germs as possible.  You can reduce the number of germs on your skin by washing with CHG (chlorahexidine gluconate) soap before surgery.  CHG is an antiseptic cleaner which kills germs and bonds with the skin to continue killing germs even after washing. Please DO NOT use if you have an allergy to CHG or antibacterial soaps.  If your skin becomes reddened/irritated stop using the CHG and inform your nurse when you arrive at Short Stay. Do not shave (including legs and underarms) for at least 48 hours prior to the first CHG shower.  You may shave your face/neck.  Please follow these instructions carefully:  1.  Shower with CHG Soap the night before surgery and the  morning of surgery.  2.  If you choose to wash your hair, wash your hair first as usual with your normal  shampoo.  3.  After you shampoo, rinse your hair and body thoroughly to remove the shampoo.  4.  Use CHG as you would any other liquid soap.  You can apply chg directly to the skin and wash.  Gently with a scrungie or clean washcloth.  5.  Apply the CHG Soap to your body ONLY FROM THE NECK DOWN.   Do   not use on face/ open                           Wound or open sores. Avoid contact with eyes, ears mouth and   genitals (private parts).                       Wash face,  Genitals (private parts) with your normal soap.             6.  Wash thoroughly, paying special attention to the area where your    surgery  will be performed.  7.  Thoroughly rinse your body with warm water  from the neck down.  8.  DO NOT  shower/wash with your normal soap after using and rinsing off the CHG Soap.                9.  Pat yourself dry with a clean towel.            10.  Wear clean pajamas.            11.  Place clean sheets on your bed the night of your first shower and do not  sleep with pets. Day of Surgery : Do not apply any lotions/deodorants the morning of surgery.  Please wear clean clothes to the hospital/surgery center.  FAILURE TO FOLLOW THESE INSTRUCTIONS MAY RESULT IN THE CANCELLATION OF YOUR SURGERY  PATIENT SIGNATURE_________________________________  NURSE SIGNATURE__________________________________  ________________________________________________________________________    Benjamen Brand  An incentive spirometer is a tool that can help keep your lungs clear and active. This tool measures how well you are filling your lungs with each breath. Taking long deep breaths may help reverse or decrease the chance of developing breathing (pulmonary) problems (especially infection) following: A long period of time when you are unable to move or be active. BEFORE THE PROCEDURE  If the spirometer includes an indicator to show your best effort, your nurse or respiratory therapist will set it to a desired goal. If possible, sit up straight or lean slightly forward. Try not to slouch. Hold the incentive spirometer in an upright position. INSTRUCTIONS FOR USE  Sit on the edge of your bed if possible, or sit up as far as you can in bed or on a chair. Hold the incentive spirometer in an upright position. Breathe out normally. Place the mouthpiece in your mouth and seal your lips tightly around it. Breathe in slowly and as deeply as possible, raising the piston or the ball toward the top of the column. Hold your breath for 3-5 seconds or for as long as possible. Allow the piston or ball to fall to the bottom of the column. Remove the mouthpiece from your mouth and breathe out normally. Rest for a few  seconds and repeat Steps 1 through 7 at least 10 times every 1-2 hours when you are awake. Take your time and take a few normal breaths between deep breaths. The spirometer may include an indicator to show your best effort. Use the indicator as a goal to work toward during each repetition. After each set of 10 deep breaths, practice coughing to be  sure your lungs are clear. If you have an incision (the cut made at the time of surgery), support your incision when coughing by placing a pillow or rolled up towels firmly against it. Once you are able to get out of bed, walk around indoors and cough well. You may stop using the incentive spirometer when instructed by your caregiver.  RISKS AND COMPLICATIONS Take your time so you do not get dizzy or light-headed. If you are in pain, you may need to take or ask for pain medication before doing incentive spirometry. It is harder to take a deep breath if you are having pain. AFTER USE Rest and breathe slowly and easily. It can be helpful to keep track of a log of your progress. Your caregiver can provide you with a simple table to help with this. If you are using the spirometer at home, follow these instructions: SEEK MEDICAL CARE IF:  You are having difficultly using the spirometer. You have trouble using the spirometer as often as instructed. Your pain medication is not giving enough relief while using the spirometer. You develop fever of 100.5 F (38.1 C) or higher. SEEK IMMEDIATE MEDICAL CARE IF:  You cough up bloody sputum that had not been present before. You develop fever of 102 F (38.9 C) or greater. You develop worsening pain at or near the incision site. MAKE SURE YOU:  Understand these instructions. Will watch your condition. Will get help right away if you are not doing well or get worse. Document Released: 03/25/2007 Document Revised: 02/04/2012 Document Reviewed: 05/26/2007 Exit[employer] Patient Information 2014 ExitCare,  [place].   ________________________________________________________________________ WHAT IS A BLOOD TRANSFUSION? Blood Transfusion Information  A transfusion is the replacement of blood or some of its parts. Blood is made up of multiple cells which provide different functions. Red blood cells carry oxygen and are used for blood loss replacement. White blood cells fight against infection. Platelets control bleeding. Plasma helps clot blood. Other blood products are available for specialized needs, such as hemophilia or other clotting disorders. BEFORE THE TRANSFUSION  Who gives blood for transfusions?  Healthy volunteers who are fully evaluated to make sure their blood is safe. This is blood bank blood. Transfusion therapy is the safest it has ever been in the practice of medicine. Before blood is taken from a donor, a complete history is taken to make sure that person has no history of diseases nor engages in risky social behavior (examples are intravenous drug use or sexual activity with multiple partners). The donor's travel history is screened to minimize risk of transmitting infections, such as malaria. The donated blood is tested for signs of infectious diseases, such as HIV and hepatitis. The blood is then tested to be sure it is compatible with you in order to minimize the chance of a transfusion reaction. If you or a relative donates blood, this is often done in anticipation of surgery and is not appropriate for emergency situations. It takes many days to process the donated blood. RISKS AND COMPLICATIONS Although transfusion therapy is very safe and saves many lives, the main dangers of transfusion include:  Getting an infectious disease. Developing a transfusion reaction. This is an allergic reaction to something in the blood you were given. Every precaution is taken to prevent this. The decision to have a blood transfusion has been considered carefully by your caregiver before blood is  given. Blood is not given unless the benefits outweigh the risks. AFTER THE TRANSFUSION Right after receiving a blood  transfusion, you will usually feel much better and more energetic. This is especially true if your red blood cells have gotten low (anemic). The transfusion raises the level of the red blood cells which carry oxygen, and this usually causes an energy increase. The nurse administering the transfusion will monitor you carefully for complications. HOME CARE INSTRUCTIONS  No special instructions are needed after a transfusion. You may find your energy is better. Speak with your caregiver about any limitations on activity for underlying diseases you may have. SEEK MEDICAL CARE IF:  Your condition is not improving after your transfusion. You develop redness or irritation at the intravenous (IV) site. SEEK IMMEDIATE MEDICAL CARE IF:  Any of the following symptoms occur over the next 12 hours: Shaking chills. You have a temperature by mouth above 102 F (38.9 C), not controlled by medicine. Chest, back, or muscle pain. People around you feel you are not acting correctly or are confused. Shortness of breath or difficulty breathing. Dizziness and fainting. You get a rash or develop hives. You have a decrease in urine output. Your urine turns a dark color or changes to pink, red, or brown. Any of the following symptoms occur over the next 10 days: You have a temperature by mouth above 102 F (38.9 C), not controlled by medicine. Shortness of breath. Weakness after normal activity. The white part of the eye turns yellow (jaundice). You have a decrease in the amount of urine or are urinating less often. Your urine turns a dark color or changes to pink, red, or brown. Document Released: 11/09/2000 Document Revised: 02/04/2012 Document Reviewed: 06/28/2008 [facility] Patient Information 2014 [place], [place].  _______________________________________________________________________

## 2024-05-12 ENCOUNTER — Ambulatory Visit: Payer: Self-pay | Admitting: Emergency Medicine

## 2024-05-12 DIAGNOSIS — M25611 Stiffness of right shoulder, not elsewhere classified: Secondary | ICD-10-CM | POA: Diagnosis not present

## 2024-05-12 DIAGNOSIS — M6281 Muscle weakness (generalized): Secondary | ICD-10-CM | POA: Diagnosis not present

## 2024-05-12 DIAGNOSIS — M19011 Primary osteoarthritis, right shoulder: Secondary | ICD-10-CM | POA: Diagnosis not present

## 2024-05-12 NOTE — Progress Notes (Signed)
 Left message for Gordon Madden at Dr. Deedra Farr office concerning the posting sheet and orders not matching.  Posting sheet stated Patellectomy and orders state arthroplasty.

## 2024-05-12 NOTE — Progress Notes (Addendum)
 COVID Vaccine Completed:  Date of COVID positive in last 90 days:  No  PCP - Kathrine Paris, MD Cardiologist - Alyssa Backbone,  MD  Pulmonologist - Dianne Fortune, MD  Cardiac clearance in Epic dated 03-31-24  Chest x-ray - N/A EKG - 01-08-24 Epic Stress Test - 07-18-17 Epic ECHO - 02-18-23 Epic Cardiac Cath - Yes, several years ago Pacemaker/ICD device last checked:N/A Spinal Cord Stimulator:N/A  Bowel Prep - N/A  Sleep Study - Yes, +sleep apnea.  No issues since weight loss CPAP - No  Fasting Blood Sugar - does not check  Checks Blood Sugar _____ times a day  Last dose of GLP1 agonist-  N/A GLP1 instructions:  Hold 7 days before surgery    Jardiance  Last dose of SGLT-2 inhibitors-  do not take after 05-21-24 SGLT-2 instructions:  Hold 3 days before surgery   Blood Thinner Instructions:  Last dose:   Time: Aspirin  Instructions:  ASA 81.  Pt to check to see if he needs to discontinue because he takes as a blood thinner. Last Dose:  Activity level:  Can go up a flight of stairs and perform activities of daily living without stopping and without symptoms of chest pain or shortness of breath.  Some limitations due to knee pain  Anesthesia review:  Acute coronary symdrome, CHF, hx of MI, HTN, DM,  ascending aorta dilation, hx of stroke, mild to mod aortic stenosis, ITP.     Platelets 85 on preop labs  Patient denies shortness of breath, fever, cough and chest pain at PAT appointment  Patient verbalized understanding of instructions that were given to them at the PAT appointment. Patient was also instructed that they will need to review over the PAT instructions again at home before surgery.

## 2024-05-13 ENCOUNTER — Encounter (HOSPITAL_COMMUNITY): Payer: Self-pay

## 2024-05-13 ENCOUNTER — Encounter (HOSPITAL_COMMUNITY)
Admission: RE | Admit: 2024-05-13 | Discharge: 2024-05-13 | Disposition: A | Discharge status: Home / Self Care | Source: Ambulatory Visit | Attending: Orthopedic Surgery | Admitting: Orthopedic Surgery

## 2024-05-13 ENCOUNTER — Other Ambulatory Visit: Payer: Self-pay

## 2024-05-13 VITALS — BP 115/74 | HR 57 | Temp 98.6°F | Resp 16 | Ht 70.0 in | Wt 246.2 lb

## 2024-05-13 DIAGNOSIS — Z955 Presence of coronary angioplasty implant and graft: Secondary | ICD-10-CM | POA: Diagnosis not present

## 2024-05-13 DIAGNOSIS — D696 Thrombocytopenia, unspecified: Secondary | ICD-10-CM | POA: Diagnosis not present

## 2024-05-13 DIAGNOSIS — Z8673 Personal history of transient ischemic attack (TIA), and cerebral infarction without residual deficits: Secondary | ICD-10-CM | POA: Diagnosis not present

## 2024-05-13 DIAGNOSIS — I252 Old myocardial infarction: Secondary | ICD-10-CM | POA: Insufficient documentation

## 2024-05-13 DIAGNOSIS — I5022 Chronic systolic (congestive) heart failure: Secondary | ICD-10-CM | POA: Diagnosis not present

## 2024-05-13 DIAGNOSIS — M25562 Pain in left knee: Secondary | ICD-10-CM | POA: Diagnosis not present

## 2024-05-13 DIAGNOSIS — E119 Type 2 diabetes mellitus without complications: Secondary | ICD-10-CM | POA: Insufficient documentation

## 2024-05-13 DIAGNOSIS — I2510 Atherosclerotic heart disease of native coronary artery without angina pectoris: Secondary | ICD-10-CM | POA: Diagnosis not present

## 2024-05-13 DIAGNOSIS — Z01812 Encounter for preprocedural laboratory examination: Secondary | ICD-10-CM | POA: Insufficient documentation

## 2024-05-13 DIAGNOSIS — F17200 Nicotine dependence, unspecified, uncomplicated: Secondary | ICD-10-CM | POA: Diagnosis not present

## 2024-05-13 DIAGNOSIS — Z96652 Presence of left artificial knee joint: Secondary | ICD-10-CM | POA: Insufficient documentation

## 2024-05-13 DIAGNOSIS — M879 Osteonecrosis, unspecified: Secondary | ICD-10-CM | POA: Insufficient documentation

## 2024-05-13 DIAGNOSIS — G4733 Obstructive sleep apnea (adult) (pediatric): Secondary | ICD-10-CM | POA: Diagnosis not present

## 2024-05-13 DIAGNOSIS — I110 Hypertensive heart disease with heart failure: Secondary | ICD-10-CM | POA: Diagnosis not present

## 2024-05-13 DIAGNOSIS — G8929 Other chronic pain: Secondary | ICD-10-CM | POA: Diagnosis not present

## 2024-05-13 LAB — CBC WITH DIFFERENTIAL/PLATELET
Abs Immature Granulocytes: 0.01 10*3/uL (ref 0.00–0.07)
Basophils Absolute: 0 10*3/uL (ref 0.0–0.1)
Basophils Relative: 0 %
Eosinophils Absolute: 0.8 10*3/uL — ABNORMAL HIGH (ref 0.0–0.5)
Eosinophils Relative: 15 %
HCT: 48.2 % (ref 39.0–52.0)
Hemoglobin: 15.8 g/dL (ref 13.0–17.0)
Immature Granulocytes: 0 %
Lymphocytes Relative: 18 %
Lymphs Abs: 1 10*3/uL (ref 0.7–4.0)
MCH: 32.4 pg (ref 26.0–34.0)
MCHC: 32.8 g/dL (ref 30.0–36.0)
MCV: 98.8 fL (ref 80.0–100.0)
Monocytes Absolute: 0.6 10*3/uL (ref 0.1–1.0)
Monocytes Relative: 12 %
Neutro Abs: 3 10*3/uL (ref 1.7–7.7)
Neutrophils Relative %: 55 %
Platelets: 85 10*3/uL — ABNORMAL LOW (ref 150–400)
RBC: 4.88 MIL/uL (ref 4.22–5.81)
RDW: 13.8 % (ref 11.5–15.5)
WBC: 5.5 10*3/uL (ref 4.0–10.5)
nRBC: 0 % (ref 0.0–0.2)

## 2024-05-13 LAB — TYPE AND SCREEN
ABO/RH(D): A POS
Antibody Screen: NEGATIVE

## 2024-05-13 LAB — COMPREHENSIVE METABOLIC PANEL WITH GFR
ALT: 16 U/L (ref 0–44)
AST: 24 U/L (ref 15–41)
Albumin: 4 g/dL (ref 3.5–5.0)
Alkaline Phosphatase: 52 U/L (ref 38–126)
Anion gap: 6 (ref 5–15)
BUN: 23 mg/dL (ref 8–23)
CO2: 27 mmol/L (ref 22–32)
Calcium: 9 mg/dL (ref 8.9–10.3)
Chloride: 104 mmol/L (ref 98–111)
Creatinine, Ser: 0.72 mg/dL (ref 0.61–1.24)
GFR, Estimated: >60 mL/min (ref >60)
Glucose, Bld: 107 mg/dL — ABNORMAL HIGH (ref 70–99)
Potassium: 4.4 mmol/L (ref 3.5–5.1)
Sodium: 137 mmol/L (ref 135–145)
Total Bilirubin: 1.1 mg/dL (ref 0.0–1.2)
Total Protein: 6.3 g/dL — ABNORMAL LOW (ref 6.5–8.1)

## 2024-05-13 LAB — GLUCOSE, CAPILLARY: Glucose-Capillary: 110 mg/dL — ABNORMAL HIGH (ref 70–99)

## 2024-05-15 ENCOUNTER — Encounter (HOSPITAL_COMMUNITY): Payer: Self-pay

## 2024-05-15 NOTE — Progress Notes (Signed)
 Case: [id number] Date/Time: 05/25/24 0715   Procedure: PATELLECTOMY (Left: Knee)   Anesthesia type: Choice   Pre-op diagnosis: AVASCULAR NECROSIS LEFT PATELLA   Location[facility] ROOM 07 / [facility]   Surgeons: Murleen Arms, MD       DISCUSSION: Gordon Madden is a 73 yo male who presents to PAT prior to surgery above. 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 Last seen in clinic on 01/17/24 prior to his shoulder surgery. He was doing well without cardiac symptoms. Tolerating medial therapy. He was cleared for surgery at that time and advised to f/u in 1 year. He received cardiac clearance again on 03/31/24:  Chart reviewed as part of pre-operative protocol coverage. Given past medical history and time since last visit, based on ACC/AHA guidelines, Gordon Madden would be at acceptable risk for the planned procedure without further cardiovascular testing.  Discussed with Dr. Denese Finn due to echo being >1 year with mild-mod AS. Patient can do flight of stairs without CP/SOB. Ok to proceed.  LD Jardiance : 05/21/24  VS: BP 115/74   Pulse (!) 57   Temp 37 C (Oral)   Resp 16   Ht 5' 10 (1.778 m)   Wt 111.7 kg   SpO2 96%   BMI 35.33 kg/m   PROVIDERS: Pahwani, Regino Caprio, MD   LABS: Labs reviewed: Acceptable for surgery. Stable thrombocytopenia (all labs ordered are listed, but only abnormal results are displayed)  Labs Reviewed  CBC WITH DIFFERENTIAL/PLATELET - Abnormal; Notable for the following components:      Result Value   Platelets 85 (*)    Eosinophils Absolute 0.8 (*)    All other components within normal limits  COMPREHENSIVE METABOLIC PANEL WITH GFR - Abnormal; Notable for the following  components:   Glucose, Bld 107 (*)    Total Protein 6.3 (*)    All other components within normal limits  GLUCOSE, CAPILLARY - Abnormal; Notable for the following components:   Glucose-Capillary 110 (*)    All other components within normal limits  TYPE AND SCREEN     IMAGES:   EKG 01/08/24:  Sinus bradycardia with 1st degree A-V block, rate 54 Low voltage QRS Left anterior fascicular block Possible Anterolateral infarct , age undetermined Abnormal ECG  CV:  Echo 02/18/2023  1. Left ventricular ejection fraction, by estimation, is 40 to 45%. The  left ventricle has mildly decreased function. The left ventricle  demonstrates regional wall motion abnormalities (see scoring  diagram/findings for description). There is mild left  ventricular hypertrophy. Left ventricular diastolic parameters are  consistent with Grade I diastolic dysfunction (impaired relaxation).   2. Right ventricular systolic function is normal. The right ventricular  size is mildly enlarged. Tricuspid regurgitation signal is inadequate for  assessing PA pressure.   3. Right atrial size was mildly dilated.   4. The mitral valve is normal in structure. Mild mitral valve  regurgitation. No evidence of mitral stenosis.   5. The aortic valve is calcified. There is moderate calcification of the  aortic valve. Aortic valve regurgitation is mild. Mild to moderate aortic  valve stenosis. Vmax 2.5 m/s, MG , AVA 1.5cm^2, DI 0.32   6. Aortic dilatation noted. There is  dilatation of the ascending aorta,  measuring 43 mm.   Stress test 07/18/2017:  Nuclear stress EF: 36%. There was no ST segment deviation noted during stress.   1. EF 36% with basal/mid inferolateral and basal inferior hypokinesis.  2. Fixed large, severe basal inferior and basal to mid inferolateral perfusion defect.  This suggests infarction without significant ischemia.    Intermediate risk study due to low EF.  Past Medical History:   Diagnosis Date   Arthritis    Ascending aorta dilatation (HCC)    BPH (benign prostatic hyperplasia)    CAD (coronary artery disease)    a. MI 2004 with occ RCA.   Cardiomyopathy    EF 40%   Chronic ITP (idiopathic thrombocytopenia) (HCC)    Chronic systolic CHF (congestive heart failure) (HCC)    Colon polyps    CPAP (continuous positive airway pressure) dependence    Diabetes mellitus, type 2 (HCC)    no meds due to weiht loss   Heart murmur    02-18-23  echo in epic   History of gout    History of kidney stones    Hyperlipidemia    Hypertension    Hypoglycemia    since weight loss wears dexcom to monitor and has been regulating per pt.   Idiopathic thrombocytopenia (HCC)    Morbid obesity (HCC)    Myocardial infarction (HCC)    OSA (obstructive sleep apnea)    07/25/10 AHI 14.9/hr   no cpap due to weight loss   Pneumonia    Primary localized osteoarthritis of left knee 06/07/2015   Primary localized osteoarthritis of right knee 08/06/2017   Primary localized osteoarthrosis of right shoulder 01/21/2024   Rhinosinusitis    Shortness of breath dyspnea    Stroke [facility])    right amaurosis fugax, concern for retinal artery occlusion 12/2009; cerebral arteriography 01/30/10: no occlusion, stenoses, dissections, aneurysms, AVM noted, patent opthalmic arteries   Vision decreased     Past Surgical History:  Procedure Laterality Date   CARDIAC CATHETERIZATION  2005   no stents   CHOLECYSTECTOMY N/A 02/02/2021   Procedure: LAPAROSCOPIC CHOLECYSTECTOMY;  Surgeon: Lujean Sake, MD;  Location: [facility] OR;  Service: General;  Laterality: N/A;   CYST EXCISION     chest and back   HERNIA REPAIR     umbilical   JOINT REPLACEMENT     Left 06/07/15   KNEE CARTILAGE SURGERY     left   KNEE SURGERY Right 08/06/2017   MENISCUS REPAIR Left    NASAL SINUS SURGERY     PARTIAL KNEE ARTHROPLASTY Right 08/06/2017   Procedure: UNICOMPARTMENTAL KNEE;  Surgeon: Osa Blase, MD;  Location: [facility] OR;   Service: Orthopedics;  Laterality: Right;   SINUS IRRIGATION     TOTAL KNEE ARTHROPLASTY Left 06/07/2015   Procedure: TOTAL KNEE ARTHROPLASTY;  Surgeon: Osa Blase, MD;  Location: [facility] OR;  Service: Orthopedics;  Laterality: Left;   TOTAL KNEE ARTHROPLASTY WITH REVISION COMPONENTS Right 10/23/2022   Procedure: TOTAL KNEE ARTHROPLASTY WITH REVISION COMPONENTS;  Surgeon: Osa Blase, MD;  [facility];  Service: Orthopedics;  Laterality: Right;   TOTAL SHOULDER ARTHROPLASTY Right 01/21/2024   Procedure: TOTAL SHOULDER ARTHROPLASTY;  Surgeon: Osa Blase, MD;  Location[facility];  Service: Orthopedics;  Laterality: Right;   UMBILICAL HERNIA REPAIR      MEDICATIONS:  aspirin  EC 81 MG tablet   carvedilol  (COREG ) 6.25 MG tablet   Coenzyme Q10 (CO Q-10) 300 MG CAPS   empagliflozin  (JARDIANCE )  10 MG TABS tablet   ENTRESTO  97-103 MG   fexofenadine (ALLEGRA) 180 MG tablet   finasteride  (PROSCAR ) 5 MG tablet   folic acid  (FOLVITE ) 400 MCG tablet   ibuprofen (ADVIL) 200 MG tablet   nitroGLYCERIN  (NITROSTAT ) 0.4 MG SL tablet   rosuvastatin  (CRESTOR ) 20 MG tablet   silodosin (RAPAFLO) 8 MG CAPS capsule   traMADol (ULTRAM) 50 MG tablet   vitamin B-12 (CYANOCOBALAMIN ) 1000 MCG tablet   No current facility-administered medications for this encounter.   Antoinette Kirschner [facility] Short Stay/Anesthesiology [facility] Phone [phone] 05/15/2024 3:14 PM

## 2024-05-15 NOTE — Anesthesia Preprocedure Evaluation (Addendum)
 Anesthesia Evaluation  Patient identified by MRN, date of birth, ID band Patient awake    Reviewed: Allergy & Precautions, NPO status , Patient's Chart, lab work & pertinent test results  Airway Mallampati: I  TM Distance: >3 FB Neck ROM: Full    Dental  (+) Teeth Intact, Dental Advisory Given   Pulmonary sleep apnea , Current Smoker   breath sounds clear to auscultation       Cardiovascular hypertension, Pt. on home beta blockers + CAD, + Past MI and +CHF  + Valvular Problems/Murmurs  Rhythm:Regular Rate:Normal  Echo:  1. . Left ventricular ejection fraction, by estimation, is 40 to 45%. The  left ventricle has mildly decreased function. The left ventricle  demonstrates regional wall motion abnormalities (see scoring  diagram/findings for description). There is mild left  ventricular hypertrophy. Left ventricular diastolic parameters are  consistent with Grade I diastolic dysfunction (impaired relaxation).   2. Right ventricular systolic function is normal. The right ventricular  size is mildly enlarged. Tricuspid regurgitation signal is inadequate for  assessing PA pressure.   3. Right atrial size was mildly dilated.   4. The mitral valve is normal in structure. Mild mitral valve  regurgitation. No evidence of mitral stenosis.   5. The aortic valve is calcified. There is moderate calcification of the  aortic valve. Aortic valve regurgitation is mild. Mild to moderate aortic  valve stenosis. Vmax 2.5 m/s, MG , AVA 1.5cm^2, DI 0.32   6. Aortic dilatation noted. There is dilatation of the ascending aorta,  measuring 43 mm.      Neuro/Psych CVA  negative psych ROS   GI/Hepatic negative GI ROS, Neg liver ROS,,,  Endo/Other  diabetes, Type 2, Oral Hypoglycemic Agents    Renal/GU Renal disease     Musculoskeletal  (+) Arthritis ,    Abdominal   Peds  Hematology  (+) Blood dyscrasia, anemia - ITP     Anesthesia Other Findings   Reproductive/Obstetrics                             Anesthesia Physical Anesthesia Plan  ASA: 3  Anesthesia Plan: General   Post-op Pain Management: Regional block*   Induction: Intravenous  PONV Risk Score and Plan: 2 and Ondansetron , Dexamethasone  and Midazolam   Airway Management Planned: LMA  Additional Equipment: None  Intra-op Plan:   Post-operative Plan: Extubation in OR  Informed Consent: I have reviewed the patients History and Physical, chart, labs and discussed the procedure including the risks, benefits and alternatives for the proposed anesthesia with the patient or authorized representative who has indicated his/her understanding and acceptance.     Dental advisory given  Plan Discussed with: CRNA  Anesthesia Plan Comments: (See PAT note from 6/18)        Anesthesia Quick Evaluation

## 2024-05-21 DIAGNOSIS — F4323 Adjustment disorder with mixed anxiety and depressed mood: Secondary | ICD-10-CM | POA: Diagnosis not present

## 2024-05-25 ENCOUNTER — Encounter (HOSPITAL_COMMUNITY): Payer: Self-pay | Admitting: Orthopedic Surgery

## 2024-05-25 ENCOUNTER — Ambulatory Visit (HOSPITAL_BASED_OUTPATIENT_CLINIC_OR_DEPARTMENT_OTHER): Payer: Self-pay | Admitting: Anesthesiology

## 2024-05-25 ENCOUNTER — Ambulatory Visit (HOSPITAL_COMMUNITY): Payer: Self-pay | Admitting: Physician Assistant

## 2024-05-25 ENCOUNTER — Encounter (HOSPITAL_COMMUNITY)
Admission: RE | Disposition: A | Discharge status: Home / Self Care | Payer: Self-pay | Source: Home / Self Care | Attending: Orthopedic Surgery

## 2024-05-25 ENCOUNTER — Ambulatory Visit (HOSPITAL_COMMUNITY)
Admission: RE | Admit: 2024-05-25 | Discharge: 2024-05-25 | Disposition: A | Discharge status: Home / Self Care | Attending: Orthopedic Surgery | Admitting: Orthopedic Surgery

## 2024-05-25 ENCOUNTER — Other Ambulatory Visit: Payer: Self-pay

## 2024-05-25 DIAGNOSIS — N181 Chronic kidney disease, stage 1: Secondary | ICD-10-CM

## 2024-05-25 DIAGNOSIS — Z6835 Body mass index (BMI) 35.0-35.9, adult: Secondary | ICD-10-CM | POA: Diagnosis not present

## 2024-05-25 DIAGNOSIS — M1990 Unspecified osteoarthritis, unspecified site: Secondary | ICD-10-CM | POA: Insufficient documentation

## 2024-05-25 DIAGNOSIS — I255 Ischemic cardiomyopathy: Secondary | ICD-10-CM | POA: Diagnosis not present

## 2024-05-25 DIAGNOSIS — Z7984 Long term (current) use of oral hypoglycemic drugs: Secondary | ICD-10-CM | POA: Insufficient documentation

## 2024-05-25 DIAGNOSIS — I130 Hypertensive heart and chronic kidney disease with heart failure and stage 1 through stage 4 chronic kidney disease, or unspecified chronic kidney disease: Secondary | ICD-10-CM

## 2024-05-25 DIAGNOSIS — G4733 Obstructive sleep apnea (adult) (pediatric): Secondary | ICD-10-CM | POA: Insufficient documentation

## 2024-05-25 DIAGNOSIS — I080 Rheumatic disorders of both mitral and aortic valves: Secondary | ICD-10-CM | POA: Diagnosis not present

## 2024-05-25 DIAGNOSIS — E1122 Type 2 diabetes mellitus with diabetic chronic kidney disease: Secondary | ICD-10-CM | POA: Diagnosis not present

## 2024-05-25 DIAGNOSIS — Z79899 Other long term (current) drug therapy: Secondary | ICD-10-CM | POA: Diagnosis not present

## 2024-05-25 DIAGNOSIS — M8788 Other osteonecrosis, other site: Secondary | ICD-10-CM | POA: Insufficient documentation

## 2024-05-25 DIAGNOSIS — D693 Immune thrombocytopenic purpura: Secondary | ICD-10-CM | POA: Diagnosis not present

## 2024-05-25 DIAGNOSIS — M87052 Idiopathic aseptic necrosis of left femur: Secondary | ICD-10-CM | POA: Diagnosis not present

## 2024-05-25 DIAGNOSIS — M109 Gout, unspecified: Secondary | ICD-10-CM | POA: Diagnosis not present

## 2024-05-25 DIAGNOSIS — Z7982 Long term (current) use of aspirin: Secondary | ICD-10-CM | POA: Insufficient documentation

## 2024-05-25 DIAGNOSIS — I2510 Atherosclerotic heart disease of native coronary artery without angina pectoris: Secondary | ICD-10-CM | POA: Diagnosis not present

## 2024-05-25 DIAGNOSIS — M879 Osteonecrosis, unspecified: Secondary | ICD-10-CM | POA: Diagnosis present

## 2024-05-25 DIAGNOSIS — M87062 Idiopathic aseptic necrosis of left tibia: Secondary | ICD-10-CM | POA: Diagnosis not present

## 2024-05-25 DIAGNOSIS — K760 Fatty (change of) liver, not elsewhere classified: Secondary | ICD-10-CM | POA: Insufficient documentation

## 2024-05-25 DIAGNOSIS — I5022 Chronic systolic (congestive) heart failure: Secondary | ICD-10-CM | POA: Insufficient documentation

## 2024-05-25 DIAGNOSIS — F17290 Nicotine dependence, other tobacco product, uncomplicated: Secondary | ICD-10-CM | POA: Diagnosis not present

## 2024-05-25 DIAGNOSIS — E119 Type 2 diabetes mellitus without complications: Secondary | ICD-10-CM

## 2024-05-25 DIAGNOSIS — D649 Anemia, unspecified: Secondary | ICD-10-CM | POA: Insufficient documentation

## 2024-05-25 DIAGNOSIS — Z96651 Presence of right artificial knee joint: Secondary | ICD-10-CM | POA: Diagnosis not present

## 2024-05-25 DIAGNOSIS — M8708 Idiopathic aseptic necrosis of bone, other site: Secondary | ICD-10-CM | POA: Diagnosis not present

## 2024-05-25 DIAGNOSIS — G4730 Sleep apnea, unspecified: Secondary | ICD-10-CM | POA: Diagnosis not present

## 2024-05-25 DIAGNOSIS — G8918 Other acute postprocedural pain: Secondary | ICD-10-CM | POA: Diagnosis not present

## 2024-05-25 DIAGNOSIS — I252 Old myocardial infarction: Secondary | ICD-10-CM | POA: Insufficient documentation

## 2024-05-25 HISTORY — PX: PATELLECTOMY: SHX1022

## 2024-05-25 LAB — GLUCOSE, CAPILLARY
Glucose-Capillary: 122 mg/dL — ABNORMAL HIGH (ref 70–99)
Glucose-Capillary: 122 mg/dL — ABNORMAL HIGH (ref 70–99)

## 2024-05-25 SURGERY — PATELLECTOMY
Anesthesia: General | Site: Knee | Laterality: Left

## 2024-05-25 MED ORDER — ACETAMINOPHEN 10 MG/ML IV SOLN: 1000.0000 mg | Freq: Once | INTRAVENOUS | Status: DC | PRN

## 2024-05-25 MED ORDER — EPHEDRINE SULFATE (PRESSORS) 50 MG/ML IJ SOLN
INTRAMUSCULAR | Status: DC | PRN
  Administered 2024-05-25: 15 mg via INTRAVENOUS
  Administered 2024-05-25 (×2): 10 mg via INTRAVENOUS

## 2024-05-25 MED ORDER — HYDROMORPHONE HCL 1 MG/ML IJ SOLN: 0.2500 mg | INTRAMUSCULAR | Status: DC | PRN

## 2024-05-25 MED ORDER — PROPOFOL 10 MG/ML IV BOLUS
INTRAVENOUS | Status: DC | PRN
  Administered 2024-05-25: 150 mg via INTRAVENOUS

## 2024-05-25 MED ORDER — VASOPRESSIN 20 UNIT/ML IV SOLN
INTRAVENOUS | Status: AC
  Filled 2024-05-25: qty 1

## 2024-05-25 MED ORDER — FENTANYL CITRATE (PF) 250 MCG/5ML IJ SOLN
INTRAMUSCULAR | Status: DC | PRN
  Administered 2024-05-25: 100 ug via INTRAVENOUS
  Administered 2024-05-25 (×2): 25 ug via INTRAVENOUS

## 2024-05-25 MED ORDER — CEFADROXIL 500 MG PO CAPS: 500.0000 mg | ORAL_CAPSULE | Freq: Two times a day (BID) | ORAL | 0 refills | Status: AC

## 2024-05-25 MED ORDER — OXYCODONE HCL 5 MG/5ML PO SOLN: 5.0000 mg | Freq: Once | ORAL | Status: AC | PRN

## 2024-05-25 MED ORDER — MIDAZOLAM HCL 2 MG/2ML IJ SOLN
INTRAMUSCULAR | Status: AC
Start: 2024-05-25 — End: 2024-05-25
  Filled 2024-05-25: qty 2

## 2024-05-25 MED ORDER — SODIUM CHLORIDE 0.9 % IV SOLN: INTRAVENOUS | Status: DC

## 2024-05-25 MED ORDER — SODIUM CHLORIDE (PF) 0.9 % IJ SOLN
INTRAMUSCULAR | Status: DC | PRN
  Administered 2024-05-25: 80 mL

## 2024-05-25 MED ORDER — ORAL CARE MOUTH RINSE: 15.0000 mL | Freq: Once | OROMUCOSAL | Status: AC

## 2024-05-25 MED ORDER — DEXAMETHASONE SODIUM PHOSPHATE 10 MG/ML IJ SOLN: 4.0000 mg | Freq: Once | INTRAMUSCULAR | Status: DC

## 2024-05-25 MED ORDER — ONDANSETRON HCL 4 MG PO TABS: 4.0000 mg | ORAL_TABLET | Freq: Three times a day (TID) | ORAL | 0 refills | Status: AC | PRN

## 2024-05-25 MED ORDER — OXYCODONE HCL 5 MG PO TABS: 5.0000 mg | ORAL_TABLET | Freq: Four times a day (QID) | ORAL | 0 refills | Status: AC | PRN

## 2024-05-25 MED ORDER — BUPIVACAINE-EPINEPHRINE (PF) 0.25% -1:200000 IJ SOLN
INTRAMUSCULAR | Status: AC
Start: 2024-05-25 — End: 2024-05-25
  Filled 2024-05-25: qty 30

## 2024-05-25 MED ORDER — DEXAMETHASONE SODIUM PHOSPHATE 10 MG/ML IJ SOLN
INTRAMUSCULAR | Status: DC | PRN
  Administered 2024-05-25: 10 mg via INTRAVENOUS

## 2024-05-25 MED ORDER — ACETAMINOPHEN 500 MG PO TABS
1000.0000 mg | ORAL_TABLET | Freq: Once | ORAL | Status: AC
  Administered 2024-05-25: 1000 mg via ORAL
  Filled 2024-05-25: qty 2

## 2024-05-25 MED ORDER — PHENYLEPHRINE HCL (PRESSORS) 10 MG/ML IV SOLN
INTRAVENOUS | Status: DC | PRN
  Administered 2024-05-25: 160 ug via INTRAVENOUS
  Administered 2024-05-25 (×4): 80 ug via INTRAVENOUS

## 2024-05-25 MED ORDER — OXYCODONE HCL 5 MG PO TABS
5.0000 mg | ORAL_TABLET | Freq: Once | ORAL | Status: AC | PRN
  Administered 2024-05-25: 5 mg via ORAL

## 2024-05-25 MED ORDER — TRANEXAMIC ACID-NACL 1000-0.7 MG/100ML-% IV SOLN
1000.0000 mg | INTRAVENOUS | Status: AC
  Administered 2024-05-25: 1000 mg via INTRAVENOUS
  Filled 2024-05-25: qty 100

## 2024-05-25 MED ORDER — PHENYLEPHRINE HCL-NACL 20-0.9 MG/250ML-% IV SOLN
INTRAVENOUS | Status: DC | PRN
  Administered 2024-05-25: 50 ug/min via INTRAVENOUS

## 2024-05-25 MED ORDER — MEPERIDINE HCL 50 MG/ML IJ SOLN: 6.2500 mg | INTRAMUSCULAR | Status: DC | PRN

## 2024-05-25 MED ORDER — SODIUM CHLORIDE (PF) 0.9 % IJ SOLN
INTRAMUSCULAR | Status: AC
  Filled 2024-05-25: qty 30

## 2024-05-25 MED ORDER — CHLORHEXIDINE GLUCONATE 0.12 % MT SOLN
15.0000 mL | Freq: Once | OROMUCOSAL | Status: AC
  Administered 2024-05-25: 15 mL via OROMUCOSAL

## 2024-05-25 MED ORDER — ROPIVACAINE HCL 5 MG/ML IJ SOLN
INTRAMUSCULAR | Status: DC | PRN
Start: 2024-05-25 — End: 2024-05-25
  Administered 2024-05-25: 30 mL via PERINEURAL

## 2024-05-25 MED ORDER — LACTATED RINGERS IV SOLN: INTRAVENOUS | Status: DC

## 2024-05-25 MED ORDER — INSULIN ASPART 100 UNIT/ML IJ SOLN: 0.0000 [IU] | INTRAMUSCULAR | Status: DC | PRN

## 2024-05-25 MED ORDER — LIDOCAINE 2% (20 MG/ML) 5 ML SYRINGE
INTRAMUSCULAR | Status: DC | PRN
  Administered 2024-05-25: 440 mg via INTRAVENOUS

## 2024-05-25 MED ORDER — ONDANSETRON HCL 4 MG/2ML IJ SOLN
INTRAMUSCULAR | Status: DC | PRN
Start: 2024-05-25 — End: 2024-05-25
  Administered 2024-05-25: 4 mg via INTRAVENOUS

## 2024-05-25 MED ORDER — ISOPROPYL ALCOHOL 70 % SOLN
Status: AC
  Filled 2024-05-25: qty 480

## 2024-05-25 MED ORDER — MIDAZOLAM HCL 2 MG/2ML IJ SOLN
INTRAMUSCULAR | Status: DC | PRN
  Administered 2024-05-25: 1 mg via INTRAVENOUS

## 2024-05-25 MED ORDER — DROPERIDOL 2.5 MG/ML IJ SOLN: 0.6250 mg | Freq: Once | INTRAMUSCULAR | Status: DC | PRN

## 2024-05-25 MED ORDER — 0.9 % SODIUM CHLORIDE (POUR BTL) OPTIME
TOPICAL | Status: DC | PRN
  Administered 2024-05-25: 1000 mL

## 2024-05-25 MED ORDER — OXYCODONE HCL 5 MG PO TABS
ORAL_TABLET | ORAL | Status: AC
Start: 2024-05-25 — End: 2024-05-25
  Filled 2024-05-25: qty 1

## 2024-05-25 MED ORDER — STERILE WATER FOR IRRIGATION IR SOLN
Status: DC | PRN
  Administered 2024-05-25: 1000 mL

## 2024-05-25 MED ORDER — CEFAZOLIN SODIUM-DEXTROSE 2-4 GM/100ML-% IV SOLN
2.0000 g | INTRAVENOUS | Status: AC
  Administered 2024-05-25: 2 g via INTRAVENOUS
  Filled 2024-05-25: qty 100

## 2024-05-25 MED ORDER — SODIUM CHLORIDE 0.9 % IR SOLN
Status: DC | PRN
  Administered 2024-05-25: 1000 mL

## 2024-05-25 MED ORDER — BUPIVACAINE LIPOSOME 1.3 % IJ SUSP
INTRAMUSCULAR | Status: AC
  Filled 2024-05-25: qty 20

## 2024-05-25 MED ORDER — PHENYLEPHRINE 80 MCG/ML (10ML) SYRINGE FOR IV PUSH (FOR BLOOD PRESSURE SUPPORT)
PREFILLED_SYRINGE | INTRAVENOUS | Status: AC
  Filled 2024-05-25: qty 10

## 2024-05-25 MED ORDER — FENTANYL CITRATE (PF) 250 MCG/5ML IJ SOLN
INTRAMUSCULAR | Status: AC
  Filled 2024-05-25: qty 5

## 2024-05-25 MED ORDER — PHENYLEPHRINE HCL-NACL 20-0.9 MG/250ML-% IV SOLN
INTRAVENOUS | Status: AC
  Filled 2024-05-25: qty 250

## 2024-05-25 MED ORDER — PROPOFOL 10 MG/ML IV BOLUS
INTRAVENOUS | Status: AC
Start: 2024-05-25 — End: 2024-05-25
  Filled 2024-05-25: qty 20

## 2024-05-25 SURGICAL SUPPLY — 51 items
BAG COUNTER SPONGE SURGICOUNT (BAG) ×1 IMPLANT
BNDG COHESIVE 4X5 TAN STRL LF (GAUZE/BANDAGES/DRESSINGS) ×1 IMPLANT
BNDG COMPR ESMARK 6X3 LF (GAUZE/BANDAGES/DRESSINGS) ×1 IMPLANT
BNDG ELASTIC 4INX 5YD STR LF (GAUZE/BANDAGES/DRESSINGS) ×1 IMPLANT
BNDG ELASTIC 6INX 5YD STR LF (GAUZE/BANDAGES/DRESSINGS) ×1 IMPLANT
COVER MAYO STAND STRL (DRAPES) ×1 IMPLANT
CUFF TOURN SGL QUICK 42 (TOURNIQUET CUFF) IMPLANT
CUFF TRNQT CYL 34X4.125X (TOURNIQUET CUFF) IMPLANT
DRAPE IMP U-DRAPE 54X76 (DRAPES) ×1 IMPLANT
DRAPE INCISE IOBAN 66X45 STRL (DRAPES) ×1 IMPLANT
DRAPE U-SHAPE 47X51 STRL (DRAPES) ×1 IMPLANT
DRESSING PEEL AND PLAC PRVNA20 (GAUZE/BANDAGES/DRESSINGS) IMPLANT
DURAPREP 26ML APPLICATOR (WOUND CARE) ×1 IMPLANT
ELECT REM PT RETURN 15FT ADLT (MISCELLANEOUS) ×1 IMPLANT
GAUZE PAD ABD 8X10 STRL (GAUZE/BANDAGES/DRESSINGS) ×1 IMPLANT
GAUZE SPONGE 4X4 12PLY STRL (GAUZE/BANDAGES/DRESSINGS) ×1 IMPLANT
GAUZE XEROFORM 1X8 LF (GAUZE/BANDAGES/DRESSINGS) ×1 IMPLANT
GLOVE BIO SURGEON STRL SZ 6.5 (GLOVE) ×1 IMPLANT
GLOVE BIOGEL PI IND STRL 6.5 (GLOVE) ×1 IMPLANT
GLOVE BIOGEL PI IND STRL 8 (GLOVE) ×1 IMPLANT
GLOVE SURG ORTHO 8.0 STRL STRW (GLOVE) ×1 IMPLANT
GOWN STRL REUS W/ TWL XL LVL3 (GOWN DISPOSABLE) ×2 IMPLANT
IMMOBILIZER KNEE 20 THIGH 36 (SOFTGOODS) IMPLANT
IMMOBILIZER KNEE 22 UNIV (SOFTGOODS) IMPLANT
KIT BASIN OR (CUSTOM PROCEDURE TRAY) ×1 IMPLANT
KIT TURNOVER KIT A (KITS) ×1 IMPLANT
MANIFOLD NEPTUNE II (INSTRUMENTS) ×1 IMPLANT
NDL HYPO 22X1.5 SAFETY MO (MISCELLANEOUS) IMPLANT
NEEDLE HYPO 22X1.5 SAFETY MO (MISCELLANEOUS) IMPLANT
NS IRRIG 1000ML POUR BTL (IV SOLUTION) ×1 IMPLANT
PACK ORTHO EXTREMITY (CUSTOM PROCEDURE TRAY) ×1 IMPLANT
PACK UNIVERSAL I (CUSTOM PROCEDURE TRAY) ×1 IMPLANT
PAD ARMBOARD POSITIONER FOAM (MISCELLANEOUS) ×2 IMPLANT
PAD CAST 4YDX4 CTTN HI CHSV (CAST SUPPLIES) ×2 IMPLANT
STAPLER SKIN PROX 35W (STAPLE) ×1 IMPLANT
STOCKINETTE 8 INCH (MISCELLANEOUS) ×1 IMPLANT
SUCTION TUBE FRAZIER 10FR DISP (SUCTIONS) IMPLANT
SUT ETHIBOND 2 V 37 (SUTURE) IMPLANT
SUT ETHILON 3 0 PS 1 (SUTURE) IMPLANT
SUT STRATAFIX PDS+ 0 24IN (SUTURE) IMPLANT
SUT VIC AB 0 CT1 36 (SUTURE) ×1 IMPLANT
SUT VIC AB 1 CT1 27XBRD ANBCTR (SUTURE) ×1 IMPLANT
SUT VIC AB 2-0 CTB1 (SUTURE) ×1 IMPLANT
SUTURE FIBERWR#2 38 REV NDL BL (SUTURE) IMPLANT
SUTURE TAPE 1.3 40 TPR END (SUTURE) IMPLANT
SYR CONTROL 10ML LL (SYRINGE) IMPLANT
TOWEL OR 17X26 10 PK STRL BLUE (TOWEL DISPOSABLE) ×1 IMPLANT
TUBING CONNECTING 10 (TUBING) ×1 IMPLANT
UNDERPAD 30X36 HEAVY ABSORB (UNDERPADS AND DIAPERS) ×1 IMPLANT
WATER STERILE IRR 1000ML POUR (IV SOLUTION) ×1 IMPLANT
YANKAUER SUCT BULB TIP NO VENT (SUCTIONS) ×1 IMPLANT

## 2024-05-25 NOTE — Progress Notes (Signed)
 Orthopedic Tech Progress Note Patient Details:  Gordon Madden 11-Jul-1951 [id number]  Patient ID: Gordon Madden, male   DOB: 14-Aug-1951, 73 y.o.   MRN: [id number] Bledsoe brace given to MD to be applied in the OR. Gordon Madden 05/25/2024, 8:57 AM

## 2024-05-25 NOTE — Op Note (Signed)
 DATE OF SURGERY:  05/25/2024 TIME: 8:40 AM  PATIENT NAME:  Gordon Madden   AGE: 73 y.o.    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  Jon Hurst, RNFA, present and scrubbed throughout the case, critical for assistance with exposure, retraction, instrumentation, and closure.   PREOPERATIVE INDICATIONS:  Gordon Madden is a 73 y.o. year old male who had undergone left total knee arthroplasty by Dr. Josefina in 2016.  He had done well after surgery but was developing worsening anterior knee pain.  Radiographs in the office demonstrated patella changes concerning for avascular necrosis.  Aspiration was negative for prosthetic joint infection.  Given progressively worsening pain and progressive reabsorption of his patella bone recommended proceeding with patellectomy.  The risks, benefits, and alternatives were discussed at length including but not limited to the risks of infection, bleeding, nerve injury, stiffness, blood clots, the need for revision surgery, cardiopulmonary complications, among others, and they were willing to proceed.  OPERATIVE FINDINGS AND UNIQUE ASPECTS OF THE CASE: Advanced necrosis changes to the patella loosening of the patellar component otherwise well appearing total knee arthroplasty without adverse features  ESTIMATED BLOOD LOSS: 50cc  OPERATIVE DESCRIPTION:  Once adequate anesthesia was induced, preoperative antibiotics, 2 gm of ancef ,1 gm of Tranexamic Acid , and 8 mg of Decadron  administered, the patient was positioned supine with a left thigh tourniquet placed.  The left lower extremity was  prepped and draped in sterile fashion.  A time-  out was performed identifying the patient, planned procedure, and the appropriate extremity.     The leg was  exsanguinated, tourniquet elevated to 250 mmHg.  A midline incision utilizing the patient's old scar was  made and full-thickness skin flaps were elevated followed by median parapatellar arthrotomy.  Clear appearing synovial fluid in the joint.  No evidence of anterior horn of the medial meniscus was released and resected.  Circumferential synovectomy was performed.  The patella was gradually everted and using Bovie cautery peeled the remaining patella and patellar button off the capsular tissue.  The capsular tissue remained intact.  2 smaller osseous fragments were also identified and resected.  Notably the patella button was loose from the patellar bone.  The knee was then thoroughly irrigated.  Next we split the retinaculum transversely both on the medial and lateral sides of the could advance retinacular flaps with the inferior flap over the top of the proximal flap.  There was about an inch of overlap with this method.  Using suture tape as well as #2 Ethibond in a running stitch we first repaired along the arthrotomy site distal to proximal and then medial to lateral.  We were very pleased with the repair and could bend the knee to about 70 degrees without any excessive tension on the repair.        The tourniquet had been let down.  No significant hemostasis was required.  The medial parapatellar arthrotomy was then reapproximated using #vicryl and #1 Stratafix sutures.  The remaining wound was closed with 0 stratafix, 2-0 Vicryl, and running 3-0 Nylon. The knee was cleaned, dried, dressed sterilely using a prevena wound vac dressing.  The patient was then brought to recovery room in  stable condition, tolerating the procedure  well. There were no complications.   Post op recs: WB: WBAT Abx: ancef  Imaging: PACU xrays, cefadroxil for extended  abx prophylaxis DVT prophylaxis: Resume aspirin  81mg  once daily starting postop day 1  follow up: 1 week after surgery for wound check and Prevena wound VAC removal. Address: [address], [place], [place] [address]  Office Phone: [phone]  Toribio Higashi, MD Orthopaedic Surgery

## 2024-05-25 NOTE — Anesthesia Postprocedure Evaluation (Signed)
 Anesthesia Post Note  Patient: AUGUSTEN LIPKIN  Procedure(s) Performed: PATELLECTOMY (Left: Knee)     Patient location during evaluation: PACU Anesthesia Type: General Level of consciousness: awake and alert Pain management: pain level controlled Vital Signs Assessment: post-procedure vital signs reviewed and stable Respiratory status: spontaneous breathing, nonlabored ventilation, respiratory function stable and patient connected to nasal cannula oxygen Cardiovascular status: blood pressure returned to baseline and stable Postop Assessment: no apparent nausea or vomiting Anesthetic complications: no   No notable events documented.  Last Vitals:  Vitals:   05/25/24 1000 05/25/24 1009  BP: 120/71 115/70  Pulse: (!) 54 63  Resp: 16 18  Temp:  36.6 C  SpO2: 94% 95%    Last Pain:  Vitals:   05/25/24 1009  TempSrc: Oral  PainSc: 0-No pain                 Franky JONETTA Bald

## 2024-05-25 NOTE — H&P (Signed)
 PATELLECTOMY ADMISSION H&P   Patient is being admitted for left patellectomy status post total knee arthroplasty.   Subjective:   Chief Complaint:left knee pain.   HPI: Gordon Madden, 73 y.o. 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  Onset of symptoms was gradual, starting 3-5 years ago with gradually worsening course since that time. The patient noted prior procedures on the knee to include  arthroscopy on the left knee(s).  Patient currently rates pain in the left knee(s) at 8 out of 10 with activity. Patient has night pain, worsening of pain with activity and weight bearing, pain that interferes with activities of daily living, and pain with passive range of motion.  Patient has evidence of avascular necrosis of patella by imaging studies.  There is no active infection.       Patient Active Problem List    Diagnosis Date Noted   Primary localized osteoarthrosis of right shoulder 01/21/2024   S/P TKR (total knee replacement), right 10/23/2022   Pain due to unicompartmental arthroplasty of knee (HCC) 10/23/2022   Allergic rhinitis 04/26/2021   Cardiomyopathy (HCC) 04/26/2021   Chronic kidney disease, stage 1 04/26/2021   Chronic systolic heart failure (HCC) 04/26/2021   Diabetic renal disease (HCC) 04/26/2021   Fatty liver 04/26/2021   Hypertensive heart disease without congestive heart failure 04/26/2021   Mixed hyperlipidemia 04/26/2021   Noninfective gastroenteritis and colitis, unspecified 04/26/2021   History of colonic polyps 04/26/2021   Primary localized osteoarthritis of right knee 08/06/2017   S/P knee replacement 08/06/2017   Ischemic cardiomyopathy 07/31/2017   DOE (dyspnea on exertion) 05/15/2017   Hypersomnia 02/04/2017   Primary localized osteoarthritis of left knee 06/07/2015   Idiopathic thrombocytopenia (HCC) 05/09/2015   Insomnia 05/09/2014   Coronary atherosclerosis of  native coronary artery 04/26/2014   Obesity 04/26/2014   Tobacco use disorder 04/26/2014   Mitral valve disorder 04/26/2014   RHINOSINUSITIS, CHRONIC 08/08/2010   Essential hypertension 07/02/2010   Old MI (myocardial infarction) 07/02/2010   C V A / STROKE 07/02/2010   Obstructive sleep apnea 06/30/2010        Past Medical History:  Diagnosis Date   Arthritis     Ascending aorta dilatation (HCC)     BPH (benign prostatic hyperplasia)     CAD (coronary artery disease)      a. MI 2004 with occ RCA.   Cardiomyopathy      EF 40%   Chronic ITP (idiopathic thrombocytopenia) (HCC)     Chronic systolic CHF (congestive heart failure) (HCC)     Colon polyps     CPAP (continuous positive airway pressure) dependence     Diabetes mellitus, type 2 (HCC)      no meds due to weiht loss   Heart murmur      02-18-23  echo in epic   History of gout     History of kidney stones     Hyperlipidemia     Hypertension     Hypoglycemia      since weight loss wears dexcom to monitor and has been regulating per pt.   Idiopathic thrombocytopenia (HCC)     Morbid obesity (HCC)     Myocardial infarction (HCC)     OSA (obstructive sleep apnea)      07/25/10 AHI 14.9/hr   no cpap due to weight loss   Pneumonia     Primary localized osteoarthritis of left  knee 06/07/2015   Primary localized osteoarthritis of right knee 08/06/2017   Primary localized osteoarthrosis of right shoulder 01/21/2024   Rhinosinusitis     Shortness of breath dyspnea     Stroke [facility])      right amaurosis fugax, concern for retinal artery occlusion 12/2009; cerebral arteriography 01/30/10: no occlusion, stenoses, dissections, aneurysms, AVM noted, patent opthalmic arteries   Vision decreased               Past Surgical History:  Procedure Laterality Date   CARDIAC CATHETERIZATION   2005    no stents   CHOLECYSTECTOMY N/A 02/02/2021    Procedure: LAPAROSCOPIC CHOLECYSTECTOMY;  Surgeon: Dasie Leonor CROME, MD;  Location: [facility] OR;   Service: General;  Laterality: N/A;   CYST EXCISION        chest and back   HERNIA REPAIR        umbilical   JOINT REPLACEMENT        Left 06/07/15   KNEE CARTILAGE SURGERY        left   KNEE SURGERY Right 08/06/2017   MENISCUS REPAIR Left     NASAL SINUS SURGERY       PARTIAL KNEE ARTHROPLASTY Right 08/06/2017    Procedure: UNICOMPARTMENTAL KNEE;  Surgeon: Josefina Chew, MD;  Location: [facility] OR;  Service: Orthopedics;  Laterality: Right;   SINUS IRRIGATION       TOTAL KNEE ARTHROPLASTY Left 06/07/2015    Procedure: TOTAL KNEE ARTHROPLASTY;  Surgeon: Chew Josefina, MD;  Location: [facility] OR;  Service: Orthopedics;  Laterality: Left;   TOTAL KNEE ARTHROPLASTY WITH REVISION COMPONENTS Right 10/23/2022    Procedure: TOTAL KNEE ARTHROPLASTY WITH REVISION COMPONENTS;  Surgeon: Josefina Chew, MD;  Location: [facility] ORS;  Service: Orthopedics;  Laterality: Right;   TOTAL SHOULDER ARTHROPLASTY Right 01/21/2024    Procedure: TOTAL SHOULDER ARTHROPLASTY;  Surgeon: Josefina Chew, MD;  Location: [facility] ORS;  Service: Orthopedics;  Laterality: Right;   UMBILICAL HERNIA REPAIR                   Current Outpatient Medications  Medication Sig Dispense Refill Last Dose/Taking   aspirin  EC 81 MG tablet Take 1 tablet (81 mg total) by mouth daily. Swallow whole. 90 tablet 3     baclofen  (LIORESAL ) 10 MG tablet Take 1 tablet (10 mg total) by mouth 3 (three) times daily. As needed for muscle spasm 50 tablet 0     carvedilol  (COREG ) 6.25 MG tablet TAKE 1 TABLET TWICE DAILY 180 tablet 3     Coenzyme Q10 (COQ-10 PO) Take 300 mg by mouth daily.         empagliflozin  (JARDIANCE ) 10 MG TABS tablet Take 1 tablet (10 mg total) by mouth daily before breakfast. 28 tablet 0     empagliflozin  (JARDIANCE ) 10 MG TABS tablet Take 1 tablet (10 mg total) by mouth daily before breakfast. 90 tablet 3     ENTRESTO  97-103 MG TAKE 1 TABLET TWICE DAILY 180 tablet 3     fexofenadine (ALLEGRA) 180 MG tablet Take 180 mg by mouth daily.          finasteride  (PROSCAR ) 5 MG tablet Take 5 mg by mouth daily.         folic acid  (FOLVITE ) 400 MCG tablet Take 400 mcg by mouth daily.         ibuprofen (ADVIL) 200 MG tablet Take 600 mg by mouth 2 (two) times daily as needed for moderate pain (pain score 4-6).  nitroGLYCERIN  (NITROSTAT ) 0.4 MG SL tablet Place 1 tablet (0.4 mg total) under the tongue every 5 (five) minutes as needed for chest pain (chets pain). 25 tablet 6     ondansetron  (ZOFRAN ) 4 MG tablet Take 1 tablet (4 mg total) by mouth every 8 (eight) hours as needed for nausea or vomiting. 10 tablet 0     oxyCODONE  (ROXICODONE ) 5 MG immediate release tablet Take 1 tablet (5 mg total) by mouth every 4 (four) hours as needed for severe pain (pain score 7-10). 30 tablet 0     rosuvastatin  (CRESTOR ) 20 MG tablet Take 1 tablet (20 mg total) by mouth daily. 90 tablet 3     sennosides-docusate sodium  (SENOKOT-S) 8.6-50 MG tablet Take 2 tablets by mouth daily. 30 tablet 1     silodosin (RAPAFLO) 8 MG CAPS capsule Take 8 mg by mouth daily.         vitamin B-12 (CYANOCOBALAMIN ) 1000 MCG tablet Take 1,000 mcg by mouth daily.            No current facility-administered medications for this visit.      Allergies       Allergies  Allergen Reactions   Plavix  [Clopidogrel ] Other (See Comments)      Thrombocytopenia-plavix  causes low platelets but pt's MD monitors pt closely, and pt does NOT take plavix .       Social History         Tobacco Use   Smoking status: Every Day      Types: Cigars      Passive exposure: Never   Smokeless tobacco: Never   Tobacco comments:      stopped smoking cigarettes in 1991, smokes 1 cigar per day  Substance Use Topics   Alcohol use: Yes      Alcohol/week: 1.0 standard drink of alcohol      Types: 1 Standard drinks or equivalent per week      Comment: occassionally         Family History  Problem Relation Age of Onset   Hypertension Father     Colon cancer Neg Hx     Heart attack Neg Hx             Review of Systems  Musculoskeletal:  Positive for arthralgias.  All other systems reviewed and are negative.     Objective:   Physical Exam Constitutional:      General: He is not in acute distress.    Appearance: Normal appearance. He is normal weight.  HENT:     Head: Normocephalic and atraumatic.  Eyes:     Extraocular Movements: Extraocular movements intact.     Conjunctiva/sclera: Conjunctivae normal.     Pupils: Pupils are equal, round, and reactive to light.  Cardiovascular:     Rate and Rhythm: Normal rate and regular rhythm.     Pulses: Normal pulses.     Heart sounds: Murmur (at baseline) heard.  Pulmonary:     Effort: Pulmonary effort is normal. No respiratory distress.     Breath sounds: Normal breath sounds.  Abdominal:     General: Bowel sounds are normal. There is no distension.     Palpations: Abdomen is soft.     Tenderness: There is no abdominal tenderness.  Musculoskeletal:        General: Tenderness present.     Cervical back: Normal range of motion and neck supple.     Comments: TTP over left patella.  No calf tenderness, swelling, or erythema.  Well healed surgical incision, otherwise no overlying lesions of area of chief complaint.  Decreased strength and ROM due to elicited pain.  Pre-operative ROM 0-120.  Dorsiflexion and plantarflexion intact.  Stable to varus and valgus stress.  BLE appear grossly neurovascularly intact.  Gait mildly antalgic.   Lymphadenopathy:     Cervical: No cervical adenopathy.  Skin:    General: Skin is warm and dry.     Capillary Refill: Capillary refill takes less than 2 seconds.     Findings: No erythema or rash.  Neurological:     General: No focal deficit present.     Mental Status: He is alert and oriented to person, place, and time.  Psychiatric:        Mood and Affect: Mood normal.        Behavior: Behavior normal.        Vital signs in last 24 hours: @VSRANGES @   Labs:     Estimated body mass  index is 35.42 kg/m as calculated from the following:   Height as of 01/21/24: 5' 10.5 (1.791 m).   Weight as of 01/21/24: 113.6 kg.     Imaging Review Plain radiographs demonstrate avascular necrosis of the left knee(s). The overall alignment isneutral. Total knee arthroplasty components in good position without adverse features.           Assessment/Plan:   Avascular necrosis of the patella, left knee    The patient history, physical examination, clinical judgment of the provider and imaging studies are consistent with avascular necrosis of the left knee(s) and patellectomy is deemed medically necessary. The treatment options were discussed at length. The risks and benefits of patellectomy were presented and reviewed. The risks due to aseptic loosening, infection, stiffness, patella tracking problems, thromboembolic complications and other imponderables were discussed. The patient acknowledged the explanation, agreed to proceed with the plan and consent was signed. Patient is being admitted for inpatient treatment for surgery, pain control, PT, OT, prophylactic antibiotics, VTE prophylaxis, progressive ambulation and ADL's and discharge planning. The patient is planning to be discharged home with bledsoe brace locked in extension, no physical therapy needed in initial phase of recovery.         Anticipated LOS equal to or greater than 2 midnights due to - Age 4 and older with one or more of the following:             - Obesity             - Expected need for hospital services (PT, OT, Nursing) required for safe   discharge             - Anticipated need for postoperative skilled nursing care or inpatient rehab             - Active co-morbidities: idiopathic thrombocytopenic purpura, CHF, mitral valve disorder, BPH, gout, prior MI, prior CVA, fatty liver disease, HLD, HTN, CAD   OR    - Unanticipated findings during/Post Surgery: None  - Patient is a high risk of re-admission due to:  None

## 2024-05-25 NOTE — Discharge Instructions (Signed)
 Orthopedic Discharge Instructions  Diet: As you were doing prior to hospitalization   Shower:  May shower but keep the wounds dry, use an occlusive plastic wrap, NO SOAKING IN TUB.  If the bandage gets wet, pat it dry, if it gets saturated please call the office.  Dressing:  Keep your dressing intact until follow up.  Activity:  Increase activity slowly as tolerated, but follow the weight bearing instructions below.  The rules on driving is that you can not be taking narcotics while you drive, and you must feel in control of the vehicle.    Weight Bearing:   full weight bearing but with knee locked in extension with the brace.    Blood clot prevention (DVT Prophylaxis): After surgery you are at an increased risk for a blood clot. you will resume aspirin  81mg  daily starting the day after srugery to help reduce your risk of getting a blood clot. This will help prevent a blood clot. Signs of a pulmonary embolus (blood clot in the lungs) include sudden short of breath, feeling lightheaded or dizzy, chest pain with a deep breath, rapid pulse rapid breathing. Signs of a blood clot in your arms or legs include new unexplained swelling and cramping, warm, red or darkened skin around the painful area. Please call the office or 911 right away if these signs or symptoms develop. To prevent constipation: you may use a stool softener such as -  Colace (over the counter) 100 mg by mouth twice a day  Drink plenty of fluids (prune juice may be helpful) and high fiber foods Miralax  (over the counter) for constipation as needed.    Itching:  If you experience itching with your medications, try taking only a single pain pill, or even half a pain pill at a time.  You may take up to 10 pain pills per day, and you can also use benadryl  over the counter for itching or also to help with sleep.   Precautions:  If you experience chest pain or shortness of breath - call 911 immediately for transfer to the hospital  emergency department!!   Call office [phone]) for the following: Temperature greater than 101F Persistent nausea and vomiting Severe uncontrolled pain Redness, tenderness, or signs of infection (pain, swelling, redness, odor or green/yellow discharge around the site) Difficulty breathing, headache or visual disturbances Hives Persistent dizziness or light-headedness Extreme fatigue Any other questions or concerns you may have after discharge  In an emergency, call 911 or go to an Emergency Department at a nearby hospital  Follow- Up Appointment:  Please call for an appointment to be seen approximately 2-3 week after surgery in [facility] with your surgeon Dr. Toribio Higashi - [phone] Address: [address], [place], [place] [address]

## 2024-05-25 NOTE — Anesthesia Procedure Notes (Signed)
 Anesthesia Regional Block: Adductor canal block   Pre-Anesthetic Checklist: , timeout performed,  Correct Patient, Correct Site, Correct Laterality,  Correct Procedure, Correct Position, site marked,  Risks and benefits discussed,  Surgical consent,  Pre-op evaluation,  At surgeon's request and post-op pain management  Laterality: Left  Prep: chloraprep       Needles:  Injection technique: Single-shot  Needle Type: Echogenic Stimulator Needle     Needle Length: 9cm  Needle Gauge: 21     Additional Needles:   Procedures:,,,, ultrasound used (permanent image in chart),,    Narrative:  Start time: 05/25/2024 7:05 AM End time: 05/25/2024 7:10 AM Injection made incrementally with aspirations every 5 mL.  Performed by: Personally  Anesthesiologist: Tilford Franky BIRCH, MD  Additional Notes: Discussed risks and benefits of the nerve block in detail, including but not limited vascular injury, permanent nerve damage and infection.   Patient tolerated the procedure well. Local anesthetic introduced in an incremental fashion under minimal resistance after negative aspirations. No paresthesias were elicited. After completion of the procedure, no acute issues were identified and patient continued to be monitored by RN.

## 2024-05-25 NOTE — Interval H&P Note (Signed)
 The patient has been re-examined, and the chart reviewed, and there have been no interval changes to the documented history and physical.    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   The risks, benefits, and alternatives have been discussed at length with patient, and the patient is willing to proceed.  Left knee marked. Consent has been signed.

## 2024-05-25 NOTE — Transfer of Care (Signed)
 Immediate Anesthesia Transfer of Care Note  Patient: Gordon Madden  Procedure(s) Performed: PATELLECTOMY (Left: Knee)  Patient Location: PACU  Anesthesia Type:GA combined with regional for post-op pain  Level of Consciousness: drowsy and patient cooperative  Airway & Oxygen Therapy: Patient Spontanous Breathing  Post-op Assessment: Report given to RN and Post -op Vital signs reviewed and stable  Post vital signs: Reviewed and stable  Last Vitals:  Vitals Value Taken Time  BP 112/69 05/25/24 09:09  Temp    Pulse 63 05/25/24 09:10  Resp 13 05/25/24 09:10  SpO2 95 % 05/25/24 09:10  Vitals shown include unfiled device data.  Last Pain:  Vitals:   05/25/24 0600  TempSrc: Oral  PainSc:          Complications: No notable events documented.

## 2024-05-25 NOTE — Anesthesia Procedure Notes (Signed)
 Procedure Name: LMA Insertion Date/Time: 05/25/2024 7:32 AM  Performed by: Nanci Riis, CRNAPre-anesthesia Checklist: Patient identified, Emergency Drugs available, Suction available, Patient being monitored and Timeout performed Patient Re-evaluated:Patient Re-evaluated prior to induction Oxygen Delivery Method: Circle system utilized Preoxygenation: Pre-oxygenation with 100% oxygen Induction Type: IV induction LMA: LMA inserted LMA Size: 4.0 Number of attempts: 1 Placement Confirmation: positive ETCO2 Tube secured with: Tape Dental Injury: Teeth and Oropharynx as per pre-operative assessment

## 2024-05-26 ENCOUNTER — Encounter (HOSPITAL_COMMUNITY): Payer: Self-pay | Admitting: Orthopedic Surgery

## 2024-06-09 DIAGNOSIS — M87052 Idiopathic aseptic necrosis of left femur: Secondary | ICD-10-CM | POA: Diagnosis not present

## 2024-07-07 ENCOUNTER — Other Ambulatory Visit: Payer: Self-pay | Admitting: Internal Medicine

## 2024-07-16 DIAGNOSIS — M222X2 Patellofemoral disorders, left knee: Secondary | ICD-10-CM | POA: Diagnosis not present

## 2024-07-21 DIAGNOSIS — M222X2 Patellofemoral disorders, left knee: Secondary | ICD-10-CM | POA: Diagnosis not present

## 2024-07-23 DIAGNOSIS — M222X2 Patellofemoral disorders, left knee: Secondary | ICD-10-CM | POA: Diagnosis not present

## 2024-07-29 DIAGNOSIS — M222X2 Patellofemoral disorders, left knee: Secondary | ICD-10-CM | POA: Diagnosis not present

## 2024-08-04 DIAGNOSIS — M222X2 Patellofemoral disorders, left knee: Secondary | ICD-10-CM | POA: Diagnosis not present

## 2024-08-13 ENCOUNTER — Encounter: Payer: Self-pay | Admitting: Internal Medicine

## 2024-08-19 DIAGNOSIS — R3912 Poor urinary stream: Secondary | ICD-10-CM | POA: Diagnosis not present

## 2024-08-19 DIAGNOSIS — R3915 Urgency of urination: Secondary | ICD-10-CM | POA: Diagnosis not present

## 2024-08-19 DIAGNOSIS — N401 Enlarged prostate with lower urinary tract symptoms: Secondary | ICD-10-CM | POA: Diagnosis not present

## 2024-08-19 DIAGNOSIS — N200 Calculus of kidney: Secondary | ICD-10-CM | POA: Diagnosis not present

## 2024-08-20 DIAGNOSIS — H6123 Impacted cerumen, bilateral: Secondary | ICD-10-CM | POA: Diagnosis not present

## 2024-08-20 DIAGNOSIS — Z9622 Myringotomy tube(s) status: Secondary | ICD-10-CM | POA: Diagnosis not present

## 2024-08-20 DIAGNOSIS — H6522 Chronic serous otitis media, left ear: Secondary | ICD-10-CM | POA: Diagnosis not present

## 2024-08-24 DIAGNOSIS — H5703 Miosis: Secondary | ICD-10-CM | POA: Diagnosis not present

## 2024-08-24 DIAGNOSIS — H02831 Dermatochalasis of right upper eyelid: Secondary | ICD-10-CM | POA: Diagnosis not present

## 2024-08-24 DIAGNOSIS — H2181 Floppy iris syndrome: Secondary | ICD-10-CM | POA: Diagnosis not present

## 2024-08-24 DIAGNOSIS — H02834 Dermatochalasis of left upper eyelid: Secondary | ICD-10-CM | POA: Diagnosis not present

## 2024-08-24 DIAGNOSIS — H34231 Retinal artery branch occlusion, right eye: Secondary | ICD-10-CM | POA: Diagnosis not present

## 2024-08-24 DIAGNOSIS — H04123 Dry eye syndrome of bilateral lacrimal glands: Secondary | ICD-10-CM | POA: Diagnosis not present

## 2024-08-24 DIAGNOSIS — H43812 Vitreous degeneration, left eye: Secondary | ICD-10-CM | POA: Diagnosis not present

## 2024-09-03 ENCOUNTER — Telehealth: Payer: Self-pay | Admitting: Internal Medicine

## 2024-09-03 ENCOUNTER — Other Ambulatory Visit: Payer: Self-pay | Admitting: Interventional Cardiology

## 2024-09-03 MED ORDER — CARVEDILOL 6.25 MG PO TABS: 6.2500 mg | ORAL_TABLET | Freq: Two times a day (BID) | ORAL | 1 refills | Status: AC

## 2024-09-03 NOTE — Telephone Encounter (Signed)
*  STAT* If patient is at the pharmacy, call can be transferred to refill team.   1. Which medications need to be refilled? (please list name of each medication and dose if known)   carvedilol  (COREG ) 6.25 MG tablet    2. Which pharmacy/location (including street and city if local pharmacy) is medication to be sent to? [facility] - [place], [place] - [address]   3. Do they need a 30 day or 90 day supply? 90

## 2024-09-03 NOTE — Telephone Encounter (Signed)
 Pt's medication was sent to pt's pharmacy as requested. Confirmation received.

## 2024-10-05 DIAGNOSIS — I350 Nonrheumatic aortic (valve) stenosis: Secondary | ICD-10-CM | POA: Diagnosis not present

## 2024-10-05 DIAGNOSIS — Z86018 Personal history of other benign neoplasm: Secondary | ICD-10-CM | POA: Diagnosis not present

## 2024-10-09 DIAGNOSIS — B351 Tinea unguium: Secondary | ICD-10-CM | POA: Diagnosis not present

## 2024-10-09 DIAGNOSIS — I429 Cardiomyopathy, unspecified: Secondary | ICD-10-CM | POA: Diagnosis not present

## 2024-10-09 DIAGNOSIS — E1121 Type 2 diabetes mellitus with diabetic nephropathy: Secondary | ICD-10-CM | POA: Diagnosis not present

## 2024-10-09 DIAGNOSIS — E782 Mixed hyperlipidemia: Secondary | ICD-10-CM | POA: Diagnosis not present

## 2024-10-09 DIAGNOSIS — F17200 Nicotine dependence, unspecified, uncomplicated: Secondary | ICD-10-CM | POA: Diagnosis not present

## 2024-10-09 DIAGNOSIS — D696 Thrombocytopenia, unspecified: Secondary | ICD-10-CM | POA: Diagnosis not present

## 2024-10-09 DIAGNOSIS — I119 Hypertensive heart disease without heart failure: Secondary | ICD-10-CM | POA: Diagnosis not present

## 2024-10-09 DIAGNOSIS — Z0000 Encounter for general adult medical examination without abnormal findings: Secondary | ICD-10-CM | POA: Diagnosis not present

## 2024-10-09 DIAGNOSIS — J309 Allergic rhinitis, unspecified: Secondary | ICD-10-CM | POA: Diagnosis not present

## 2024-10-09 DIAGNOSIS — N181 Chronic kidney disease, stage 1: Secondary | ICD-10-CM | POA: Diagnosis not present

## 2024-10-19 ENCOUNTER — Ambulatory Visit (INDEPENDENT_AMBULATORY_CARE_PROVIDER_SITE_OTHER)

## 2024-10-19 ENCOUNTER — Encounter: Payer: Self-pay | Admitting: Podiatry

## 2024-10-19 ENCOUNTER — Ambulatory Visit: Admitting: Podiatry

## 2024-10-19 DIAGNOSIS — M7752 Other enthesopathy of left foot: Secondary | ICD-10-CM

## 2024-10-19 DIAGNOSIS — M7751 Other enthesopathy of right foot: Secondary | ICD-10-CM

## 2024-10-19 DIAGNOSIS — M778 Other enthesopathies, not elsewhere classified: Secondary | ICD-10-CM

## 2024-10-19 DIAGNOSIS — M779 Enthesopathy, unspecified: Secondary | ICD-10-CM

## 2024-10-19 MED ORDER — TRIAMCINOLONE ACETONIDE 10 MG/ML IJ SUSP
10.0000 mg | Freq: Once | INTRAMUSCULAR | Status: AC
  Administered 2024-10-19: 10 mg via INTRA_ARTICULAR

## 2024-10-20 NOTE — Progress Notes (Signed)
 Subjective:   Patient ID: Gordon Madden, male   DOB: 73 y.o.   MRN: [id number]   HPI Patient presents stating that he is getting a lot of pain on the top of both feet and that it has been increasingly hard to wear shoe gear with comfortably.  States its intensified over the last few months does not remember injury and patient does smoke cigars tries to be active   Review of Systems  All other systems reviewed and are negative.       Objective:  Physical Exam Vitals and nursing note reviewed.  Constitutional:      Appearance: He is well-developed.  Pulmonary:     Effort: Pulmonary effort is normal.  Musculoskeletal:        General: Normal range of motion.  Skin:    General: Skin is warm.  Neurological:     Mental Status: He is alert.     Neurovascular status found to be intact muscle strength adequate range of motion adequate swelling across the dorsum of both feet right over left of the midtarsal joint inflammation around the area fluid buildup and pain.  Patient is found to have good digital perfusion well-oriented x 3     Assessment:  Probability for dorsal tendinitis right also has moderate collapse of medial longitudinal arch bilateral and long-term history of orthotics that he has not worn a number of years     Plan:  H&P reviewed all conditions discussed arthritis of the midtarsal joint and the collapse of the arch around that area.  At this point I did do sterile prep and I injected the dorsal tendon complex bilateral 3 mg dexamethasone  Kenalog  5 mg Xylocaine  after explaining risk applied sterile dressing discussed orthotics which I think would be of good benefit for this patient and reviewed orthotic treatment with consideration for this.  At this point patient to be seen back in approximate 1 month  X-rays indicate there is some spurring across the midtarsal joint bilateral with moderate collapse occurring at this joint bilateral

## 2024-11-16 ENCOUNTER — Ambulatory Visit: Admitting: Podiatry

## 2024-11-16 ENCOUNTER — Encounter: Payer: Self-pay | Admitting: Podiatry

## 2024-11-16 DIAGNOSIS — M779 Enthesopathy, unspecified: Secondary | ICD-10-CM | POA: Diagnosis not present

## 2024-11-16 NOTE — Progress Notes (Signed)
 Subjective:   Patient ID: Gordon Madden, male   DOB: 73 y.o.   MRN: [id number]   HPI Patient states he seems to be improved with reduced pain   ROS      Objective:  Physical Exam  Neurovascular status intact with pain on the dorsal extensor complex right over a rock that is improved but still painful with compression.     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  We reviewed possible long-term things we could try if symptoms get worse but at this point organ to hold off and just watch this and all questions answered today

## 2024-12-09 ENCOUNTER — Other Ambulatory Visit: Payer: Self-pay | Admitting: *Deleted

## 2024-12-09 DIAGNOSIS — D696 Thrombocytopenia, unspecified: Secondary | ICD-10-CM

## 2024-12-09 DIAGNOSIS — Z862 Personal history of diseases of the blood and blood-forming organs and certain disorders involving the immune mechanism: Secondary | ICD-10-CM

## 2024-12-10 ENCOUNTER — Inpatient Hospital Stay: Payer: Medicare HMO | Attending: Internal Medicine

## 2024-12-10 ENCOUNTER — Inpatient Hospital Stay: Payer: Medicare HMO | Admitting: Internal Medicine

## 2024-12-10 VITALS — BP 117/67 | HR 78 | Temp 97.8°F | Resp 17 | Ht 71.0 in | Wt 272.4 lb

## 2024-12-10 DIAGNOSIS — D696 Thrombocytopenia, unspecified: Secondary | ICD-10-CM

## 2024-12-10 DIAGNOSIS — D693 Immune thrombocytopenic purpura: Secondary | ICD-10-CM | POA: Diagnosis not present

## 2024-12-10 DIAGNOSIS — Z862 Personal history of diseases of the blood and blood-forming organs and certain disorders involving the immune mechanism: Secondary | ICD-10-CM

## 2024-12-10 LAB — CBC WITH DIFFERENTIAL (CANCER CENTER ONLY)
Abs Immature Granulocytes: 0.01 K/uL (ref 0.00–0.07)
Basophils Absolute: 0 K/uL (ref 0.0–0.1)
Basophils Relative: 0 %
Eosinophils Absolute: 1 K/uL — ABNORMAL HIGH (ref 0.0–0.5)
Eosinophils Relative: 17 %
HCT: 49.7 % (ref 39.0–52.0)
Hemoglobin: 16.8 g/dL (ref 13.0–17.0)
Immature Granulocytes: 0 %
Lymphocytes Relative: 15 %
Lymphs Abs: 0.9 K/uL (ref 0.7–4.0)
MCH: 32.2 pg (ref 26.0–34.0)
MCHC: 33.8 g/dL (ref 30.0–36.0)
MCV: 95.4 fL (ref 80.0–100.0)
Monocytes Absolute: 0.6 K/uL (ref 0.1–1.0)
Monocytes Relative: 11 %
Neutro Abs: 3.2 K/uL (ref 1.7–7.7)
Neutrophils Relative %: 57 %
Platelet Count: 68 K/uL — ABNORMAL LOW (ref 150–400)
RBC: 5.21 MIL/uL (ref 4.22–5.81)
RDW: 14 % (ref 11.5–15.5)
WBC Count: 5.7 K/uL (ref 4.0–10.5)
nRBC: 0 % (ref 0.0–0.2)

## 2024-12-10 LAB — CMP (CANCER CENTER ONLY)
ALT: 15 U/L (ref 0–44)
AST: 29 U/L (ref 15–41)
Albumin: 4.5 g/dL (ref 3.5–5.0)
Alkaline Phosphatase: 53 U/L (ref 38–126)
Anion gap: 10 (ref 5–15)
BUN: 27 mg/dL — ABNORMAL HIGH (ref 8–23)
CO2: 27 mmol/L (ref 22–32)
Calcium: 9.3 mg/dL (ref 8.9–10.3)
Chloride: 104 mmol/L (ref 98–111)
Creatinine: 0.86 mg/dL (ref 0.61–1.24)
GFR, Estimated: >60 mL/min
Glucose, Bld: 81 mg/dL (ref 70–99)
Potassium: 4.4 mmol/L (ref 3.5–5.1)
Sodium: 141 mmol/L (ref 135–145)
Total Bilirubin: 1 mg/dL (ref 0.0–1.2)
Total Protein: 6.7 g/dL (ref 6.5–8.1)

## 2024-12-10 NOTE — Progress Notes (Signed)
 "     [facility] [phone]   [phone]  OFFICE PROGRESS NOTE  Pahwani, Velna SAUNDERS, MD 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 male returns to the clinic today for follow-up visit.Discussed the use of AI scribe software for clinical note transcription with the patient, who gave verbal consent to proceed.  History of Present Illness Gordon Madden is a 74 year old male with chronic mild thrombocytopenia who presents for evaluation of a recent decline in platelet count.  He has chronic mild thrombocytopenia, with platelet counts previously stable in the 80,000s, now decreased to 68,000. He has not required specific treatment for thrombocytopenia to date.  He reports easy bruising. Approximately one week ago, he developed a left subconjunctival hemorrhage following rupture of a blood vessel, with visible bleeding at onset. He denies epistaxis, gingival bleeding, or other abnormal bleeding.  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    MEDICAL HISTORY: Past Medical History:  Diagnosis Date   Arthritis    Ascending aorta dilatation    BPH (benign prostatic hyperplasia)    CAD (coronary artery disease)    a. MI 2004 with occ RCA.   Cardiomyopathy    EF 40%   Chronic ITP (idiopathic thrombocytopenia) (HCC)    Chronic systolic CHF (congestive heart failure) (HCC)    Colon polyps    CPAP (continuous positive airway pressure) dependence    Diabetes mellitus, type 2 (HCC)    no meds due to weiht loss   Heart murmur    02-18-23  echo in epic   History of gout    History of kidney stones    Hyperlipidemia     Hypertension    Hypoglycemia    since weight loss wears dexcom to monitor and has been regulating per pt.   Idiopathic thrombocytopenia (HCC)    Morbid obesity (HCC)    Myocardial infarction (HCC)    OSA (obstructive sleep apnea)    07/25/10 AHI 14.9/hr   no cpap due to weight loss   Pneumonia    Primary localized osteoarthritis of left knee 06/07/2015   Primary localized osteoarthritis of right knee 08/06/2017   Primary localized osteoarthrosis of right shoulder 01/21/2024   Rhinosinusitis    Shortness of breath dyspnea    Stroke [facility])    right amaurosis fugax, concern for retinal artery occlusion 12/2009; cerebral arteriography 01/30/10: no occlusion, stenoses, dissections, aneurysms, AVM noted, patent opthalmic arteries   Vision decreased     ALLERGIES:  is allergic to plavix  [clopidogrel ].  MEDICATIONS:  Current Outpatient Medications  Medication Sig Dispense Refill   aspirin  EC 81 MG tablet Take 1 tablet (81 mg total) by mouth daily. Swallow whole. 90 tablet 3   carvedilol  (COREG ) 6.25 MG tablet Take 1 tablet (6.25 mg total) by mouth 2 (two) times daily. 180 tablet 1   Coenzyme Q10 (CO Q-10) 300 MG CAPS Take 300 mg by mouth daily.     empagliflozin  (JARDIANCE ) 10 MG TABS tablet Take 1 tablet (10 mg total) by mouth daily before breakfast. 90 tablet 3   ENTRESTO  97-103 MG TAKE 1 TABLET TWICE DAILY 180 tablet 3   fexofenadine (ALLEGRA) 180 MG tablet  Take 180 mg by mouth daily.     finasteride  (PROSCAR ) 5 MG tablet Take 5 mg by mouth daily.     folic acid  (FOLVITE ) 400 MCG tablet Take 400 mcg by mouth daily.     ibuprofen (ADVIL) 200 MG tablet Take 400 mg by mouth 2 (two) times daily as needed for moderate pain (pain score 4-6).     nitroGLYCERIN  (NITROSTAT ) 0.4 MG SL tablet Place 1 tablet (0.4 mg total) under the tongue every 5 (five) minutes as needed for chest pain (chets pain). 25 tablet 6   rosuvastatin  (CRESTOR ) 20 MG tablet Take 1 tablet (20 mg total) by mouth daily. 90 tablet 3    silodosin (RAPAFLO) 8 MG CAPS capsule Take 8 mg by mouth daily.     traMADol (ULTRAM) 50 MG tablet Take 50 mg by mouth 3 (three) times daily as needed.     vitamin B-12 (CYANOCOBALAMIN ) 1000 MCG tablet Take 1,000 mcg by mouth daily.     No current facility-administered medications for this visit.    SURGICAL HISTORY:  Past Surgical History:  Procedure Laterality Date   CARDIAC CATHETERIZATION  2005   no stents   CHOLECYSTECTOMY N/A 02/02/2021   Procedure: LAPAROSCOPIC CHOLECYSTECTOMY;  Surgeon: Dasie Leonor CROME, MD;  Location: [facility] OR;  Service: General;  Laterality: N/A;   CYST EXCISION     chest and back   HERNIA REPAIR     umbilical   JOINT REPLACEMENT     Left 06/07/15   KNEE CARTILAGE SURGERY     left   KNEE SURGERY Right 08/06/2017   MENISCUS REPAIR Left    NASAL SINUS SURGERY     PARTIAL KNEE ARTHROPLASTY Right 08/06/2017   Procedure: UNICOMPARTMENTAL KNEE;  Surgeon: Josefina Chew, MD;  Location: [facility] OR;  Service: Orthopedics;  Laterality: Right;   PATELLECTOMY Left 05/25/2024   Procedure: PATELLECTOMY;  Surgeon: Edna Toribio LABOR, MD;  Location: [facility] ORS;  Service: Orthopedics;  Laterality: Left;   SINUS IRRIGATION     TOTAL KNEE ARTHROPLASTY Left 06/07/2015   Procedure: TOTAL KNEE ARTHROPLASTY;  Surgeon: Chew Josefina, MD;  Location: [facility] OR;  Service: Orthopedics;  Laterality: Left;   TOTAL KNEE ARTHROPLASTY WITH REVISION COMPONENTS Right 10/23/2022   Procedure: TOTAL KNEE ARTHROPLASTY WITH REVISION COMPONENTS;  Surgeon: Josefina Chew, MD;  Location: [facility];  Service: Orthopedics;  Laterality: Right;   TOTAL SHOULDER ARTHROPLASTY Right 01/21/2024   Procedure: TOTAL SHOULDER ARTHROPLASTY;  Surgeon: Josefina Chew, MD;  Location: [facility];  Service: Orthopedics;  Laterality: Right;   UMBILICAL HERNIA REPAIR      REVIEW OF SYSTEMS:  A comprehensive review of systems was negative except for: Eyes: positive for left eye conjunctival hemorrhage   PHYSICAL EXAMINATION: General  appearance: alert, cooperative, and no distress Head: Normocephalic, without obvious abnormality, atraumatic Neck: no adenopathy, no JVD, supple, symmetrical, trachea midline, and thyroid not enlarged, symmetric, no tenderness/mass/nodules Lymph nodes: Cervical, supraclavicular, and axillary nodes normal. Resp: clear to auscultation bilaterally Back: symmetric, no curvature. ROM normal. No CVA tenderness. Cardio: regular rate and rhythm, S1, S2 normal, no murmur, click, rub or gallop GI: soft, non-tender; bowel sounds normal; no masses,  no organomegaly Extremities: extremities normal, atraumatic, no cyanosis or edema  ECOG PERFORMANCE STATUS: 0 - Asymptomatic  Blood pressure 117/67, pulse 78, temperature 97.8 F (36.6 C), temperature source Temporal, resp. rate 17, height 5' 11 (1.803 m), weight 272 lb 6.4 oz (123.6 kg), SpO2 96%.  LABORATORY DATA: Lab Results  Component Value Date  WBC 5.7 12/10/2024   HGB 16.8 12/10/2024   HCT 49.7 12/10/2024   MCV 95.4 12/10/2024   PLT 68 (L) 12/10/2024      Chemistry      Component Value Date/Time   NA 137 05/13/2024 1320   NA 138 10/02/2017 1345   NA 137 05/09/2015 1111   K 4.4 05/13/2024 1320   K 4.7 05/09/2015 1111   CL 104 05/13/2024 1320   CO2 27 05/13/2024 1320   CO2 22 05/09/2015 1111   BUN 23 05/13/2024 1320   BUN 17 10/02/2017 1345   BUN 15.2 05/09/2015 1111   CREATININE 0.72 05/13/2024 1320   CREATININE 0.82 05/21/2023 1430   CREATININE 0.8 05/09/2015 1111      Component Value Date/Time   CALCIUM  9.0 05/13/2024 1320   CALCIUM  8.7 05/09/2015 1111   ALKPHOS 52 05/13/2024 1320   ALKPHOS 46 05/09/2015 1111   AST 24 05/13/2024 1320   AST 28 05/21/2023 1430   AST 16 05/09/2015 1111   ALT 16 05/13/2024 1320   ALT 20 05/21/2023 1430   ALT 17 05/09/2015 1111   BILITOT 1.1 05/13/2024 1320   BILITOT 1.0 05/21/2023 1430   BILITOT 0.82 05/09/2015 1111       RADIOGRAPHIC STUDIES: No results found.   ASSESSMENT AND  PLAN: This is a very pleasant 74 years old white male with mild thrombocytopenia likely secondary to liver disease versus ITP.  He is currently on observation. Repeat CBC today showed platelet count of 68,000 with absolute eosinophil count of 1000. Assessment and Plan Assessment & Plan Thrombocytopenia Chronic mild thrombocytopenia with recent decline in platelet count to 68,000 from prior baseline in the 80,000s. He reports easy bruising and a recent subconjunctival hemorrhage, without other significant bleeding symptoms. Differential includes liver disease versus immune thrombocytopenic purpura. No acute complications identified. Further evaluation is indicated to exclude other etiologies and confirm bone marrow function. - Ordered bone marrow biopsy to evaluate etiology and assess marrow function. - Scheduled procedure at [facility] within approximately two weeks; explained that the biopsy is performed at the iliac bone, is safe with thrombocytopenia, and will be performed under sedation as an outpatient procedure lasting less than one hour. He will require an escort home due to sedation. - Planned follow-up visit one to two weeks post-procedure to review results and discuss further management. The patient was advised to call immediately if he has any other concerning symptoms in the interval.  The patient voices understanding of current disease status and treatment options and is in agreement with the current care plan.  All questions were answered. The patient knows to call the clinic with any problems, questions or concerns. We can certainly see the patient much sooner if necessary.  The total time spent in the appointment was 20 minutes.  Disclaimer: This note was dictated with voice recognition software. Similar sounding words can inadvertently be transcribed and may not be corrected upon review.        "

## 2024-12-11 ENCOUNTER — Telehealth: Payer: Self-pay | Admitting: Internal Medicine

## 2024-12-11 NOTE — Telephone Encounter (Signed)
 Called pt and is aware of the f/u on 2/12

## 2024-12-21 NOTE — H&P (Signed)
 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 male smoker  with past medical history significant for arthritis, BPH, coronary artery disease with prior MI cardiomyopathy, CHF, colon polyps, sleep apnea on CPAP, diabetes, gout, nephrolithiasis, hypertension, hyperlipidemia, prior CVA, and idiopathic thrombocytopenia likely from liver disease versus ITP.  He presents today for image guided bone marrow biopsy for further evaluation.  *** Patient is Full Code  Past Medical History:  Diagnosis Date   Arthritis    Ascending aorta dilatation    BPH (benign prostatic hyperplasia)    CAD (coronary artery disease)    a. MI 2004 with occ RCA.   Cardiomyopathy    EF 40%   Chronic ITP (idiopathic thrombocytopenia) (HCC)    Chronic systolic CHF (congestive heart failure) (HCC)    Colon polyps    CPAP (continuous positive airway pressure) dependence    Diabetes mellitus, type 2 (HCC)    no meds due to weiht loss   Heart murmur    02-18-23  echo in epic   History of gout    History of kidney stones    Hyperlipidemia    Hypertension    Hypoglycemia    since weight loss wears dexcom to monitor and has been regulating per pt.   Idiopathic thrombocytopenia (HCC)    Morbid obesity (HCC)    Myocardial infarction (HCC)    OSA (obstructive sleep apnea)    07/25/10 AHI 14.9/hr   no cpap due to weight loss   Pneumonia    Primary localized osteoarthritis of left knee 06/07/2015   Primary localized osteoarthritis of right knee 08/06/2017   Primary localized osteoarthrosis of right shoulder 01/21/2024   Rhinosinusitis    Shortness of breath dyspnea    Stroke [facility])    right amaurosis fugax, concern for retinal artery occlusion  12/2009; cerebral arteriography 01/30/10: no occlusion, stenoses, dissections, aneurysms, AVM noted, patent opthalmic arteries   Vision decreased     Past Surgical History:  Procedure Laterality Date   CARDIAC CATHETERIZATION  2005   no stents   CHOLECYSTECTOMY N/A 02/02/2021   Procedure: LAPAROSCOPIC CHOLECYSTECTOMY;  Surgeon: Dasie Leonor CROME, MD;  Location: [facility] OR;  Service: General;  Laterality: N/A;   CYST EXCISION     chest and back   HERNIA REPAIR     umbilical   JOINT REPLACEMENT     Left 06/07/15   KNEE CARTILAGE SURGERY     left   KNEE SURGERY Right 08/06/2017   MENISCUS REPAIR Left    NASAL SINUS SURGERY     PARTIAL KNEE ARTHROPLASTY Right 08/06/2017   Procedure: UNICOMPARTMENTAL KNEE;  Surgeon: Josefina Chew, MD;  Location: [facility] OR;  Service: Orthopedics;  Laterality: Right;   PATELLECTOMY Left 05/25/2024   Procedure: PATELLECTOMY;  Surgeon: Edna Toribio LABOR, MD;  Location: [facility] ORS;  Service: Orthopedics;  Laterality: Left;   SINUS IRRIGATION     TOTAL KNEE ARTHROPLASTY Left 06/07/2015   Procedure: TOTAL KNEE ARTHROPLASTY;  Surgeon: Chew Josefina, MD;  Location: [facility] OR;  Service: Orthopedics;  Laterality: Left;   TOTAL KNEE ARTHROPLASTY WITH REVISION COMPONENTS Right 10/23/2022   Procedure: TOTAL KNEE ARTHROPLASTY WITH REVISION COMPONENTS;  Surgeon: Josefina Chew, MD;  Location: [facility] ORS;  Service: Orthopedics;  Laterality: Right;  TOTAL SHOULDER ARTHROPLASTY Right 01/21/2024   Procedure: TOTAL SHOULDER ARTHROPLASTY;  Surgeon: Josefina Chew, MD;  Location: [facility] ORS;  Service: Orthopedics;  Laterality: Right;   UMBILICAL HERNIA REPAIR      Allergies: Plavix  [clopidogrel ]  Medications: Prior to Admission medications  Medication Sig Start Date End Date Taking? Authorizing Provider  aspirin  EC 81 MG tablet Take 1 tablet (81 mg total) by mouth daily. Swallow whole. 04/30/23   Dann Candyce RAMAN, MD  carvedilol  (COREG ) 6.25 MG tablet Take 1 tablet (6.25 mg total) by mouth 2 (two)  times daily. 09/03/24   Thukkani, Arun K, MD  Coenzyme Q10 (CO Q-10) 300 MG CAPS Take 300 mg by mouth daily.    [provider]  empagliflozin  (JARDIANCE ) 10 MG TABS tablet Take 1 tablet (10 mg total) by mouth daily before breakfast. 01/17/24   Thukkani, Arun K, MD  ENTRESTO  97-103 MG TAKE 1 TABLET TWICE DAILY 02/10/24   Thukkani, Arun K, MD  fexofenadine (ALLEGRA) 180 MG tablet Take 180 mg by mouth daily.    [provider]  finasteride  (PROSCAR ) 5 MG tablet Take 5 mg by mouth daily. 06/16/22   [provider]  folic acid  (FOLVITE ) 400 MCG tablet Take 400 mcg by mouth daily.    [provider]  ibuprofen (ADVIL) 200 MG tablet Take 400 mg by mouth 2 (two) times daily as needed for moderate pain (pain score 4-6).    [provider]  nitroGLYCERIN  (NITROSTAT ) 0.4 MG SL tablet Place 1 tablet (0.4 mg total) under the tongue every 5 (five) minutes as needed for chest pain (chets pain). 01/17/24   Thukkani, Arun K, MD  rosuvastatin  (CRESTOR ) 20 MG tablet Take 1 tablet (20 mg total) by mouth daily. 02/10/24   Thukkani, Arun K, MD  silodosin (RAPAFLO) 8 MG CAPS capsule Take 8 mg by mouth daily. 04/25/22   [provider]  traMADol (ULTRAM) 50 MG tablet Take 50 mg by mouth 3 (three) times daily as needed. 04/23/24   [provider]  vitamin B-12 (CYANOCOBALAMIN ) 1000 MCG tablet Take 1,000 mcg by mouth daily.    [provider]     Family History  Problem Relation Age of Onset   Hypertension Father    Colon cancer Neg Hx    Heart attack Neg Hx     Social History   Socioeconomic History   Marital status: Married    Spouse name: Not on file   Number of children: Not on file   Years of education: Not on file   Highest education level: Not on file  Occupational History   Occupation: Public Relations Account Executive: [employer]  Tobacco Use   Smoking status: Every Day    Types: Cigars    Passive exposure: Never    Smokeless tobacco: Never   Tobacco comments:    stopped smoking cigarettes in 1991, smokes 1 cigar per day  Vaping Use   Vaping status: Never Used  Substance and Sexual Activity   Alcohol  use: Yes    Alcohol /week: 1.0 standard drink of alcohol     Types: 1 Standard drinks or equivalent per week    Comment: occassionally   Drug use: No   Sexual activity: Not Currently  Other Topics Concern   Not on file  Social History Narrative   ** Merged History Encounter **       Social Drivers of Health   Tobacco Use: High Risk (11/16/2024)   Patient History  Smoking Tobacco Use: Every Day    Smokeless Tobacco Use: Never    Passive Exposure: Never  Financial Resource Strain: Not on file  Food Insecurity: No Food Insecurity (10/23/2022)   Hunger Vital Sign    Worried About Running Out of Food in the Last Year: Never true    Ran Out of Food in the Last Year: Never true  Transportation Needs: No Transportation Needs (10/23/2022)   PRAPARE - Administrator, Civil Service (Medical): No    Lack of Transportation (Non-Medical): No  Physical Activity: Not on file  Stress: Not on file  Social Connections: Not on file  Depression (EYV7-0): Not on file  Alcohol  Screen: Not on file  Housing: Low Risk (10/23/2022)   Housing    Last Housing Risk Score: 0  Utilities: Not At Risk (10/23/2022)   AHC Utilities    Threatened with loss of utilities: No  Health Literacy: Not on file      Review of Systems  Vital Signs:   Advance Care Plan: no documents on file  Physical Exam  Imaging: No results found.  Labs:  CBC: Recent Labs    01/08/24 1000 01/21/24 1129 05/13/24 1320 12/10/24 0933  WBC 3.9* 5.2 5.5 5.7  HGB 14.3 15.4 15.8 16.8  HCT 45.3 47.0 48.2 49.7  PLT 68* 81* 85* 68*    COAGS: No results for input(s): INR, APTT in the last 8760 hours.  BMP: Recent Labs    01/08/24 1000 05/13/24 1320 12/10/24 0933  NA 138 137 141  K 4.1 4.4 4.4  CL 104 104  104  CO2 29 27 27   GLUCOSE 98 107* 81  BUN 26* 23 27*  CALCIUM  8.9 9.0 9.3  CREATININE 0.74 0.72 0.86  GFRNONAA >60 >60 >60    LIVER FUNCTION TESTS: Recent Labs    05/13/24 1320 12/10/24 0933  BILITOT 1.1 1.0  AST 24 29  ALT 16 15  ALKPHOS 52 53  PROT 6.3* 6.7  ALBUMIN  4.0 4.5    TUMOR MARKERS: No results for input(s): AFPTM, CEA, CA199, CHROMGRNA in the last 8760 hours.  Assessment and Plan: 74 y.o. male smoker  with past medical history significant for arthritis, BPH, coronary artery disease with prior MI cardiomyopathy, CHF, colon polyps, sleep apnea on CPAP, diabetes, gout, nephrolithiasis, hypertension, hyperlipidemia, prior CVA, and idiopathic thrombocytopenia likely from liver disease versus ITP.  He presents today for image guided bone marrow biopsy for further evaluation.Risks and benefits of procedure was discussed with the patient  including, but not limited to bleeding, infection, damage to adjacent structures or low yield requiring additional tests.  All of the questions were answered and there is agreement to proceed.  Consent signed and in chart.    Thank you for allowing our service to participate in Gordon Madden 's care.  Electronically Signed: D. Franky Rakers, PA-C   12/21/2024, 6:26 PM      I spent a total of  20 minutes in face to face in clinical consultation, greater than 50% of which was counseling/coordinating care for image guided bone marrow biopsy   "

## 2024-12-22 ENCOUNTER — Ambulatory Visit (HOSPITAL_COMMUNITY)
Admission: RE | Admit: 2024-12-22 | Discharge: 2024-12-22 | Disposition: A | Source: Ambulatory Visit | Attending: Internal Medicine

## 2024-12-22 ENCOUNTER — Encounter (HOSPITAL_COMMUNITY): Payer: Self-pay

## 2024-12-22 ENCOUNTER — Ambulatory Visit (HOSPITAL_COMMUNITY)
Admission: RE | Admit: 2024-12-22 | Discharge: 2024-12-22 | Disposition: A | Discharge status: Home / Self Care | Source: Ambulatory Visit | Attending: Internal Medicine | Admitting: Internal Medicine

## 2024-12-22 VITALS — BP 99/67 | HR 59 | Temp 97.7°F | Resp 18

## 2024-12-22 DIAGNOSIS — I5022 Chronic systolic (congestive) heart failure: Secondary | ICD-10-CM | POA: Insufficient documentation

## 2024-12-22 DIAGNOSIS — Z87442 Personal history of urinary calculi: Secondary | ICD-10-CM | POA: Insufficient documentation

## 2024-12-22 DIAGNOSIS — C9440 Acute panmyelosis with myelofibrosis not having achieved remission: Secondary | ICD-10-CM | POA: Insufficient documentation

## 2024-12-22 DIAGNOSIS — Z7982 Long term (current) use of aspirin: Secondary | ICD-10-CM | POA: Insufficient documentation

## 2024-12-22 DIAGNOSIS — I2510 Atherosclerotic heart disease of native coronary artery without angina pectoris: Secondary | ICD-10-CM | POA: Insufficient documentation

## 2024-12-22 DIAGNOSIS — D693 Immune thrombocytopenic purpura: Secondary | ICD-10-CM | POA: Insufficient documentation

## 2024-12-22 DIAGNOSIS — Z79899 Other long term (current) drug therapy: Secondary | ICD-10-CM | POA: Insufficient documentation

## 2024-12-22 DIAGNOSIS — M1000 Idiopathic gout, unspecified site: Secondary | ICD-10-CM | POA: Insufficient documentation

## 2024-12-22 DIAGNOSIS — I429 Cardiomyopathy, unspecified: Secondary | ICD-10-CM | POA: Insufficient documentation

## 2024-12-22 DIAGNOSIS — E785 Hyperlipidemia, unspecified: Secondary | ICD-10-CM | POA: Insufficient documentation

## 2024-12-22 DIAGNOSIS — N400 Enlarged prostate without lower urinary tract symptoms: Secondary | ICD-10-CM | POA: Insufficient documentation

## 2024-12-22 DIAGNOSIS — Z9049 Acquired absence of other specified parts of digestive tract: Secondary | ICD-10-CM | POA: Insufficient documentation

## 2024-12-22 DIAGNOSIS — Z860100 Personal history of colon polyps, unspecified: Secondary | ICD-10-CM | POA: Insufficient documentation

## 2024-12-22 DIAGNOSIS — D696 Thrombocytopenia, unspecified: Secondary | ICD-10-CM | POA: Insufficient documentation

## 2024-12-22 DIAGNOSIS — I110 Hypertensive heart disease with heart failure: Secondary | ICD-10-CM | POA: Insufficient documentation

## 2024-12-22 DIAGNOSIS — E119 Type 2 diabetes mellitus without complications: Secondary | ICD-10-CM | POA: Insufficient documentation

## 2024-12-22 DIAGNOSIS — G4733 Obstructive sleep apnea (adult) (pediatric): Secondary | ICD-10-CM | POA: Insufficient documentation

## 2024-12-22 DIAGNOSIS — I252 Old myocardial infarction: Secondary | ICD-10-CM | POA: Insufficient documentation

## 2024-12-22 DIAGNOSIS — Z8673 Personal history of transient ischemic attack (TIA), and cerebral infarction without residual deficits: Secondary | ICD-10-CM | POA: Insufficient documentation

## 2024-12-22 DIAGNOSIS — Z7984 Long term (current) use of oral hypoglycemic drugs: Secondary | ICD-10-CM | POA: Insufficient documentation

## 2024-12-22 LAB — CBC WITH DIFFERENTIAL/PLATELET
Abs Immature Granulocytes: 0.01 10*3/uL (ref 0.00–0.07)
Basophils Absolute: 0 10*3/uL (ref 0.0–0.1)
Basophils Relative: 0 %
Eosinophils Absolute: 0.9 10*3/uL — ABNORMAL HIGH (ref 0.0–0.5)
Eosinophils Relative: 18 %
HCT: 47.3 % (ref 39.0–52.0)
Hemoglobin: 16.1 g/dL (ref 13.0–17.0)
Immature Granulocytes: 0 %
Lymphocytes Relative: 18 %
Lymphs Abs: 0.9 10*3/uL (ref 0.7–4.0)
MCH: 32.9 pg (ref 26.0–34.0)
MCHC: 34 g/dL (ref 30.0–36.0)
MCV: 96.5 fL (ref 80.0–100.0)
Monocytes Absolute: 0.6 10*3/uL (ref 0.1–1.0)
Monocytes Relative: 11 %
Neutro Abs: 2.8 10*3/uL (ref 1.7–7.7)
Neutrophils Relative %: 53 %
Platelets: 70 10*3/uL — ABNORMAL LOW (ref 150–400)
RBC: 4.9 MIL/uL (ref 4.22–5.81)
RDW: 13.8 % (ref 11.5–15.5)
Smear Review: NORMAL
WBC: 5.2 10*3/uL (ref 4.0–10.5)
nRBC: 0 % (ref 0.0–0.2)

## 2024-12-22 LAB — GLUCOSE, CAPILLARY: Glucose-Capillary: 111 mg/dL — ABNORMAL HIGH (ref 70–99)

## 2024-12-22 MED ORDER — SODIUM CHLORIDE 0.9 % IV SOLN: INTRAVENOUS | Status: DC

## 2024-12-22 MED ORDER — FENTANYL CITRATE (PF) 100 MCG/2ML IJ SOLN
INTRAMUSCULAR | Status: AC | PRN
  Administered 2024-12-22 (×2): 50 ug via INTRAVENOUS

## 2024-12-22 MED ORDER — MIDAZOLAM HCL 2 MG/2ML IJ SOLN
INTRAMUSCULAR | Status: AC
  Filled 2024-12-22: qty 2

## 2024-12-22 MED ORDER — MIDAZOLAM HCL (PF) 2 MG/2ML IJ SOLN
INTRAMUSCULAR | Status: AC | PRN
  Administered 2024-12-22 (×3): 1 mg via INTRAVENOUS

## 2024-12-22 MED ORDER — FENTANYL CITRATE (PF) 100 MCG/2ML IJ SOLN
INTRAMUSCULAR | Status: AC
  Filled 2024-12-22: qty 2

## 2024-12-22 NOTE — Procedures (Signed)
 Interventional Radiology Procedure Note  Procedure: CT guided bone marrow aspiration and biopsy  Complications: None  EBL: < 10 mL  Findings: Aspirate and core biopsy performed of bone marrow in right iliac bone.  Plan: Bedrest supine x 1 hrs  Ericha Whittingham T. Fredia Sorrow, M.D Pager:  [phone]

## 2024-12-22 NOTE — Discharge Instructions (Signed)
 MAY CONTACT INTERVENTIONAL RADIOLOGY CLINIC AT [phone] WITH ANY QUESTIONS OR CONCERNS  MAY REMOVE DRESSING TOMORROW AND SHOWER

## 2024-12-24 LAB — SURGICAL PATHOLOGY

## 2024-12-29 ENCOUNTER — Encounter (HOSPITAL_COMMUNITY): Payer: Self-pay

## 2025-01-07 ENCOUNTER — Inpatient Hospital Stay: Admitting: Internal Medicine

## 2025-01-07 ENCOUNTER — Inpatient Hospital Stay
